# Patient Record
Sex: Female | Born: 1950 | Race: White | Hispanic: No | Smoking: Never smoker
Health system: Southern US, Community
[De-identification: ages and names within clinical notes are randomized; demographics above are authoritative.]

## PROBLEM LIST (undated history)

## (undated) DIAGNOSIS — I10 Essential (primary) hypertension: Secondary | ICD-10-CM

## (undated) DIAGNOSIS — Z803 Family history of malignant neoplasm of breast: Secondary | ICD-10-CM

## (undated) DIAGNOSIS — Z800 Family history of malignant neoplasm of digestive organs: Secondary | ICD-10-CM

## (undated) DIAGNOSIS — C50919 Malignant neoplasm of unspecified site of unspecified female breast: Secondary | ICD-10-CM

## (undated) DIAGNOSIS — E063 Autoimmune thyroiditis: Secondary | ICD-10-CM

## (undated) DIAGNOSIS — Z801 Family history of malignant neoplasm of trachea, bronchus and lung: Secondary | ICD-10-CM

## (undated) DIAGNOSIS — K9041 Non-celiac gluten sensitivity: Secondary | ICD-10-CM

## (undated) HISTORY — DX: Essential (primary) hypertension: I10

## (undated) HISTORY — DX: Family history of malignant neoplasm of digestive organs: Z80.0

## (undated) HISTORY — DX: Autoimmune thyroiditis: E06.3

## (undated) HISTORY — DX: Non-celiac gluten sensitivity: K90.41

## (undated) HISTORY — DX: Malignant neoplasm of unspecified site of unspecified female breast: C50.919

## (undated) HISTORY — DX: Family history of malignant neoplasm of breast: Z80.3

## (undated) HISTORY — DX: Family history of malignant neoplasm of trachea, bronchus and lung: Z80.1

---

## 1972-04-11 HISTORY — PX: LUMBAR DISC SURGERY: SHX700

## 1977-04-11 HISTORY — PX: APPENDECTOMY: SHX54

## 1977-04-11 HISTORY — PX: RIGHT OOPHORECTOMY: SHX2359

## 2015-04-12 HISTORY — PX: URETEROSCOPY: SHX842

## 2017-04-11 HISTORY — PX: MASTECTOMY W/ SENTINEL NODE BIOPSY: SHX2001

## 2017-07-22 DIAGNOSIS — F419 Anxiety disorder, unspecified: Secondary | ICD-10-CM | POA: Insufficient documentation

## 2017-07-22 DIAGNOSIS — Z8639 Personal history of other endocrine, nutritional and metabolic disease: Secondary | ICD-10-CM | POA: Insufficient documentation

## 2017-07-22 DIAGNOSIS — C50919 Malignant neoplasm of unspecified site of unspecified female breast: Secondary | ICD-10-CM | POA: Insufficient documentation

## 2017-10-02 LAB — IRON,TIBC AND FERRITIN PANEL
%SAT: 32
Ferritin: 313
TIBC: 265
UIBC: 179

## 2017-10-02 LAB — LIPID PANEL
Cholesterol: 135 (ref 0–200)
HDL: 49 (ref 35–70)
LDL Cholesterol: 75
Triglycerides: 57 (ref 40–160)

## 2017-10-02 LAB — CBC AND DIFFERENTIAL
HCT: 34 — AB (ref 36–46)
Hemoglobin: 11.3 — AB (ref 12.0–16.0)
Neutrophils Absolute: 2
Platelets: 155 (ref 150–399)
WBC: 4.3

## 2017-10-02 LAB — VITAMIN D 25 HYDROXY (VIT D DEFICIENCY, FRACTURES): VIT D 25 HYDROXY: 71.7

## 2017-10-02 LAB — BASIC METABOLIC PANEL
BUN: 20 (ref 4–21)
CREATININE: 0.8 (ref 0.5–1.1)
GLUCOSE: 84
POTASSIUM: 4.1 (ref 3.4–5.3)
Sodium: 142 (ref 137–147)

## 2017-10-02 LAB — HEPATIC FUNCTION PANEL
ALT: 12 (ref 7–35)
AST: 22 (ref 13–35)
Alkaline Phosphatase: 70 (ref 25–125)
Bilirubin, Total: 0.6

## 2017-10-02 LAB — TSH: TSH: 2.1 (ref 0.41–5.90)

## 2017-10-02 LAB — VITAMIN B12: VITAMIN B 12: 200

## 2017-10-02 LAB — HEMOGLOBIN A1C: HEMOGLOBIN A1C: 5

## 2017-10-27 DIAGNOSIS — C50812 Malignant neoplasm of overlapping sites of left female breast: Secondary | ICD-10-CM | POA: Insufficient documentation

## 2017-10-27 DIAGNOSIS — Z170 Estrogen receptor positive status [ER+]: Secondary | ICD-10-CM

## 2017-11-08 ENCOUNTER — Ambulatory Visit (INDEPENDENT_AMBULATORY_CARE_PROVIDER_SITE_OTHER): Payer: Medicare HMO | Admitting: Family Medicine

## 2017-11-08 ENCOUNTER — Encounter: Payer: Self-pay | Admitting: Family Medicine

## 2017-11-08 VITALS — BP 118/76 | HR 66 | Temp 97.5°F | Resp 16 | Ht 66.0 in | Wt 182.0 lb

## 2017-11-08 DIAGNOSIS — Z9012 Acquired absence of left breast and nipple: Secondary | ICD-10-CM | POA: Diagnosis not present

## 2017-11-08 DIAGNOSIS — C50912 Malignant neoplasm of unspecified site of left female breast: Secondary | ICD-10-CM

## 2017-11-08 DIAGNOSIS — I10 Essential (primary) hypertension: Secondary | ICD-10-CM

## 2017-11-08 DIAGNOSIS — Z170 Estrogen receptor positive status [ER+]: Secondary | ICD-10-CM | POA: Diagnosis not present

## 2017-11-08 DIAGNOSIS — C50919 Malignant neoplasm of unspecified site of unspecified female breast: Secondary | ICD-10-CM | POA: Insufficient documentation

## 2017-11-08 NOTE — Progress Notes (Signed)
Patient: Meredith Hamilton, Female    DOB: 1951/01/19, 67 y.o.   MRN: [id number] Visit Date: 11/09/2017  Today's Provider: Lavon Paganini, MD   I, Martha Clan, CMA, am acting as scribe for Lavon Paganini, MD.  Chief Complaint  Patient presents with  . New Patient (Initial Visit)   Subjective:    New Patient Meredith Hamilton is a 67 y.o. female who presents today as a new patient to establish care. She feels well. She reports exercising daily. She reports she is sleeping well.  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  After sentinel lymph node was positive, they converted to left axillary lymph node dissection. - Pathology from her mastectomy and lymph node dissection showed invasive lobular carcinoma, 2 foci, multicentric.  Metastatic carcinoma in 4 of 14 lymph nodes -She was staged as T2N2A -She then was seen by the [facility] in [place] that recommended chemotherapy with Taxotere, cyclophosphamide, Kytril - Patient decided not to undergo chemotherapy next line-she moved back to [place] from [place] as she felt that her care was disjointed and she wanted to be with her family -Since moving to [place] she has been followed by surgical oncology at [place] is seeing Dalene Carrow, NP -She has not undergone further surgeries or treatment, but she has had to have a seroma drained multiple times -She denies any lymphedema symptoms at this time and is doing her exercises regularly - Surgical oncology discussed with her seeing a medical oncologist and she has agreed to this-she is waiting on referral -Her son-in-law practices functional medicine, but I do not believe he is in [place].  He is checking  several labs on her every 45 days, including CMP, A1c, uric acid, B12, folate, selenium, iron panel, cholesterol, CRP, TSH, T4, T3 uptake, free thyroxine index, T3, free T3, reverse T3, free T4, TPO antibodies, thyroglobulin antibody, vitamin D, homocystine, fibrinogen activity, CBC, UA with micro, Ca1 25, CEA, CA 27.29 -Her son-in-law is also giving her many supplements to take-see medications - she is also doing a paleo diet, laser treatment to "clean out her blood," oxygenating her lymphatic system  HTN: - diagnosed about 4 years - Medications: amlodipine 34m daily - Compliance: good - Checking BP at home: no - Denies any SOB, CP, vision changes, LE edema, medication SEs, or symptoms of hypotension  Of note, patient believes that she had her last colonoscopy in 2016 and that it was normal.  She will get these records and bring them by. -----------------------------------------------------------------   Review of Systems  Constitutional: Negative.   HENT: Negative.   Eyes: Negative.   Respiratory: Negative.   Cardiovascular: Negative.   Gastrointestinal: Negative.   Endocrine: Negative.   Genitourinary: Negative.   Musculoskeletal: Negative.   Skin: Negative.   Allergic/Immunologic: Negative.   Neurological: Negative.   Hematological: Negative.   Psychiatric/Behavioral: Negative.     Social History      She  reports that she has never smoked. She has never used smokeless tobacco. She reports that she drank alcohol. She reports that she does not use drugs.       Social History   Socioeconomic History  . Marital status: Divorced    Spouse name: Not on  file  . Number of children: 2  . Years of education: 73  . Highest education level: High school graduate  Occupational History  . Occupation: retired Sales promotion account executive for [employer]  . Financial resource strain: Not on file  . Food insecurity:    Worry: Not on file    Inability: Not on file  .  Transportation needs:    Medical: Not on file    Non-medical: Not on file  Tobacco Use  . Smoking status: Never Smoker  . Smokeless tobacco: Never Used  Substance and Sexual Activity  . Alcohol use: Not Currently    Frequency: Never  . Drug use: Never  . Sexual activity: Not Currently  Lifestyle  . Physical activity:    Days per week: Not on file    Minutes per session: Not on file  . Stress: Not on file  Relationships  . Social connections:    Talks on phone: Not on file    Gets together: Not on file    Attends religious service: Not on file    Active member of club or organization: Not on file    Attends meetings of clubs or organizations: Not on file    Relationship status: Not on file  Other Topics Concern  . Not on file  Social History Narrative  . Not on file    Past Medical History:  Diagnosis Date  . Breast CA ([place])   . Hashimoto's disease   . Hypertension   . Non-celiac gluten sensitivity      Patient Active Problem List   Diagnosis Date Noted  . Breast cancer ([place]) 11/08/2017  . Status post mastectomy, left 11/08/2017  . Essential hypertension 11/08/2017    Past Surgical History:  Procedure Laterality Date  . APPENDECTOMY  1979  . [place]  . MASTECTOMY W/ SENTINEL NODE BIOPSY Left 2019  . RIGHT OOPHORECTOMY  1979   due to ovarian torsion  . URETEROSCOPY Right 2017    Family History        Family Status  Relation Name Status  . Mother  Deceased  . Father  Deceased  . Sister  (Not Specified)  . Brother  Deceased  . Brother  Deceased  . Neg Hx  (Not Specified)        Her family history includes Breast cancer (age of onset: 69) in her sister; Lung cancer in her brother, father, and mother; Throat cancer in her brother. There is no history of Colon cancer, Cervical cancer, or Ovarian cancer.      Allergies  Allergen Reactions  . Sulfa Antibiotics Swelling  . Tramadol Other (See Comments)    Other reaction(s):  Hallucination     Current Outpatient Medications:  .  amLODipine (NORVASC) 10 MG tablet, Take 10 mg by mouth daily., Disp: , Rfl:  .  Cholecalciferol (VITAMIN D3) LIQD, Take 6 drops by mouth daily., Disp: , Rfl:  .  Homeopathic Products (FRANKINCENSE UPLIFTING) OIL, Take 7 drops by mouth daily., Disp: , Rfl:  .  Misc Natural Products (SPLEEN) CAPS, Take 2 capsules by mouth daily., Disp: , Rfl:  .  OVER THE COUNTER MEDICATION, Take 4 Scoops by mouth daily. Pecta Sol-C (Mod. Citrus Pectin), Disp: , Rfl:  .  OVER THE COUNTER MEDICATION, Take 2 Scoops by mouth daily. Fermented Mushroom Blend, Disp: , Rfl:  .  OVER THE COUNTER MEDICATION, Take 6 capsules by mouth daily. Cruciferous Complete, Disp: , Rfl:  .  OVER THE COUNTER MEDICATION, Nitric Balance 4 tsp daily, Disp: , Rfl:  .  Specialty Vitamins Products (MAMMARY) TABS, Take 2 tablets by mouth daily., Disp: , Rfl:  .  TURMERIC PO, Take 7 drops by mouth daily., Disp: , Rfl:  .  UNABLE TO FIND, Take 6 tablets by mouth daily. Med Name: Vitamin B 17, Disp: , Rfl:  .  UNABLE TO FIND, Take 10 drops by mouth daily. Med Name: Liquid Vitamin A, Disp: , Rfl:  .  UNABLE TO FIND, Med Name: Hydrex 2 vials daily, Disp: , Rfl:  .  UNABLE TO FIND, Med Name: Proglyco-SP 3 every other day, Disp: , Rfl:  .  UNABLE TO FIND, Med Name: Hemp Oil Complete 2 daily, Disp: , Rfl:  .  UNABLE TO FIND, Med Name: Artemisinin Complex 2 daily, Disp: , Rfl:  .  UNABLE TO FIND, Med Name: Adrena Calm 1/4 pump daily, Disp: , Rfl:  .  UNABLE TO FIND, Med Name: Immuno Plus 3 drops daily, Disp: , Rfl:  .  UNABLE TO FIND, Med Name: Rockne Menghini Glutathione 5 tsp daily, Disp: , Rfl:  .  UNABLE TO FIND, Med Name: Vitamin C Drip 20,000 once weekly, Disp: , Rfl:  .  UNABLE TO FIND, Med Name: Myrrh 3 drops daily, Disp: , Rfl:  .  UNABLE TO FIND, Med Name: Welford Roche Vite 6 per day, Disp: , Rfl:  .  UNABLE TO FIND, Med Name: Pea Protein Isolate 1 scoop per day, Disp: , Rfl:  .  UNABLE TO FIND,  Med Name: Nourish Greens 1 scoop per day, Disp: , Rfl:    Patient Care Team: Virginia Crews, MD as PCP - General (Family Medicine)      Objective:   Vitals: BP 118/76 (BP Location: Left Arm, Patient Position: Sitting, Cuff Size: Large)   Pulse 66   Temp (!) 97.5 F (36.4 C) (Oral)   Resp 16   Ht _0  (1.676 m)   Wt 182 lb (82.6 kg)   SpO2 98%   BMI 29.38 kg/m    Vitals:   11/08/17 1320  BP: 118/76  Pulse: 66  Resp: 16  Temp: (!) 97.5 F (36.4 C)  TempSrc: Oral  SpO2: 98%  Weight: 182 lb (82.6 kg)  Height: _1  (1.676 m)     Physical Exam  Constitutional: She is oriented to person, place, and time. She appears well-developed and well-nourished. No distress.  HENT:  Head: Normocephalic and atraumatic.  Right Ear: External ear normal.  Left Ear: External ear normal.  Nose: Nose normal.  Mouth/Throat: Oropharynx is clear and moist.  Eyes: Pupils are equal, round, and reactive to light. Conjunctivae and EOM are normal. No scleral icterus.  Neck: Neck supple. No thyromegaly present.  Cardiovascular: Normal rate, regular rhythm, normal heart sounds and intact distal pulses.  No murmur heard. Pulmonary/Chest: Effort normal and breath sounds normal. No respiratory distress. She has no wheezes. She has no rales.  Abdominal: Soft. Bowel sounds are normal. She exhibits no distension. There is no tenderness. There is no rebound and no guarding.  Musculoskeletal: She exhibits no edema or deformity.  Lymphadenopathy:    She has no cervical adenopathy.  Neurological: She is alert and oriented to person, place, and time. No cranial nerve deficit or sensory deficit. She exhibits normal muscle tone.  Skin: Skin is warm and dry. Capillary refill takes less than 2 seconds. No rash noted.  Psychiatric: She has a normal mood and affect. Her  behavior is normal.  Vitals reviewed.    Depression Screen PHQ 2/9 Scores 11/09/2017  PHQ - 2 Score 0    Assessment & Plan:      Problem List Items Addressed This Visit      Cardiovascular and Mediastinum   Essential hypertension    Well-controlled Reviewed recent CMP Continue amlodipine 10 mg daily Follow-up in 3 to 6 months      Relevant Medications   amLODipine (NORVASC) 10 MG tablet     Other   Breast cancer ([place]) - Primary    Discussed with patient that breast cancer is likely a very treatable cancer She has agreed to see a medical oncologist to discuss options and make a more informed decision      Status post mastectomy, left    Followed by Surg Onc at [facility] for complications         Return in about 3 months (around 02/08/2018) for chronic disease f/u.   Addressed extensive list of chronic and acute medical problems today requiring extensive time in counseling and coordination of care.  Over half of this 45 minute visit were spent in counseling and coordinating care of multiple medical problems.    The entirety of the information documented in the History of Present Illness, Review of Systems and Physical Exam were personally obtained by me. Portions of this information were initially documented by Raquel Sarna Ratchford, CMA and reviewed by me for thoroughness and accuracy.    Virginia Crews, MD, MPH [facility] 11/09/2017 10:00 AM

## 2017-11-08 NOTE — Patient Instructions (Signed)
DASH Eating Plan DASH stands for "Dietary Approaches to Stop Hypertension." The DASH eating plan is a healthy eating plan that has been shown to reduce high blood pressure (hypertension). It may also reduce your risk for type 2 diabetes, heart disease, and stroke. The DASH eating plan may also help with weight loss. What are tips for following this plan? General guidelines  Avoid eating more than 2,300 mg (milligrams) of salt (sodium) a day. If you have hypertension, you may need to reduce your sodium intake to 1,500 mg a day.  Limit alcohol intake to no more than 1 drink a day for nonpregnant women and 2 drinks a day for men. One drink equals 12 oz of beer, 5 oz of wine, or 1 oz of hard liquor.  Work with your health care provider to maintain a healthy body weight or to lose weight. Ask what an ideal weight is for you.  Get at least 30 minutes of exercise that causes your heart to beat faster (aerobic exercise) most days of the week. Activities may include walking, swimming, or biking.  Work with your health care provider or diet and nutrition specialist (dietitian) to adjust your eating plan to your individual calorie needs. Reading food labels  Check food labels for the amount of sodium per serving. Choose foods with less than 5 percent of the Daily Value of sodium. Generally, foods with less than 300 mg of sodium per serving fit into this eating plan.  To find whole grains, look for the word "whole" as the first word in the ingredient list. Shopping  Buy products labeled as "low-sodium" or "no salt added."  Buy fresh foods. Avoid canned foods and premade or frozen meals. Cooking  Avoid adding salt when cooking. Use salt-free seasonings or herbs instead of table salt or sea salt. Check with your health care provider or pharmacist before using salt substitutes.  Do not fry foods. Cook foods using healthy methods such as baking, boiling, grilling, and broiling instead.  Cook with  heart-healthy oils, such as olive, canola, soybean, or sunflower oil. Meal planning   Eat a balanced diet that includes: ? 5 or more servings of fruits and vegetables each day. At each meal, try to fill half of your plate with fruits and vegetables. ? Up to 6-8 servings of whole grains each day. ? Less than 6 oz of lean meat, poultry, or fish each day. A 3-oz serving of meat is about the same size as a deck of cards. One egg equals 1 oz. ? 2 servings of low-fat dairy each day. ? A serving of nuts, seeds, or beans 5 times each week. ? Heart-healthy fats. Healthy fats called Omega-3 fatty acids are found in foods such as flaxseeds and coldwater fish, like sardines, salmon, and mackerel.  Limit how much you eat of the following: ? Canned or prepackaged foods. ? Food that is high in trans fat, such as fried foods. ? Food that is high in saturated fat, such as fatty meat. ? Sweets, desserts, sugary drinks, and other foods with added sugar. ? Full-fat dairy products.  Do not salt foods before eating.  Try to eat at least 2 vegetarian meals each week.  Eat more home-cooked food and less restaurant, buffet, and fast food.  When eating at a restaurant, ask that your food be prepared with less salt or no salt, if possible. What foods are recommended? The items listed may not be a complete list. Talk with your dietitian about what   dietary choices are best for you. Grains Whole-grain or whole-wheat bread. Whole-grain or whole-wheat pasta. Brown rice. Oatmeal. Quinoa. Bulgur. Whole-grain and low-sodium cereals. Pita bread. Low-fat, low-sodium crackers. Whole-wheat flour tortillas. Vegetables Fresh or frozen vegetables (raw, steamed, roasted, or grilled). Low-sodium or reduced-sodium tomato and vegetable juice. Low-sodium or reduced-sodium tomato sauce and tomato paste. Low-sodium or reduced-sodium canned vegetables. Fruits All fresh, dried, or frozen fruit. Canned fruit in natural juice (without  added sugar). Meat and other protein foods Skinless chicken or turkey. Ground chicken or turkey. Pork with fat trimmed off. Fish and seafood. Egg whites. Dried beans, peas, or lentils. Unsalted nuts, nut butters, and seeds. Unsalted canned beans. Lean cuts of beef with fat trimmed off. Low-sodium, lean deli meat. Dairy Low-fat (1%) or fat-free (skim) milk. Fat-free, low-fat, or reduced-fat cheeses. Nonfat, low-sodium ricotta or cottage cheese. Low-fat or nonfat yogurt. Low-fat, low-sodium cheese. Fats and oils Soft margarine without trans fats. Vegetable oil. Low-fat, reduced-fat, or light mayonnaise and salad dressings (reduced-sodium). Canola, safflower, olive, soybean, and sunflower oils. Avocado. Seasoning and other foods Herbs. Spices. Seasoning mixes without salt. Unsalted popcorn and pretzels. Fat-free sweets. What foods are not recommended? The items listed may not be a complete list. Talk with your dietitian about what dietary choices are best for you. Grains Baked goods made with fat, such as croissants, muffins, or some breads. Dry pasta or rice meal packs. Vegetables Creamed or fried vegetables. Vegetables in a cheese sauce. Regular canned vegetables (not low-sodium or reduced-sodium). Regular canned tomato sauce and paste (not low-sodium or reduced-sodium). Regular tomato and vegetable juice (not low-sodium or reduced-sodium). Pickles. Olives. Fruits Canned fruit in a light or heavy syrup. Fried fruit. Fruit in cream or butter sauce. Meat and other protein foods Fatty cuts of meat. Ribs. Fried meat. Bacon. Sausage. Bologna and other processed lunch meats. Salami. Fatback. Hotdogs. Bratwurst. Salted nuts and seeds. Canned beans with added salt. Canned or smoked fish. Whole eggs or egg yolks. Chicken or turkey with skin. Dairy Whole or 2% milk, cream, and half-and-half. Whole or full-fat cream cheese. Whole-fat or sweetened yogurt. Full-fat cheese. Nondairy creamers. Whipped toppings.  Processed cheese and cheese spreads. Fats and oils Butter. Stick margarine. Lard. Shortening. Ghee. Bacon fat. Tropical oils, such as coconut, palm kernel, or palm oil. Seasoning and other foods Salted popcorn and pretzels. Onion salt, garlic salt, seasoned salt, table salt, and sea salt. Worcestershire sauce. Tartar sauce. Barbecue sauce. Teriyaki sauce. Soy sauce, including reduced-sodium. Steak sauce. Canned and packaged gravies. Fish sauce. Oyster sauce. Cocktail sauce. Horseradish that you find on the shelf. Ketchup. Mustard. Meat flavorings and tenderizers. Bouillon cubes. Hot sauce and Tabasco sauce. Premade or packaged marinades. Premade or packaged taco seasonings. Relishes. Regular salad dressings. Where to find more information:  National Heart, Lung, and Blood Institute: www.nhlbi.nih.gov  American Heart Association: www.heart.org Summary  The DASH eating plan is a healthy eating plan that has been shown to reduce high blood pressure (hypertension). It may also reduce your risk for type 2 diabetes, heart disease, and stroke.  With the DASH eating plan, you should limit salt (sodium) intake to 2,300 mg a day. If you have hypertension, you may need to reduce your sodium intake to 1,500 mg a day.  When on the DASH eating plan, aim to eat more fresh fruits and vegetables, whole grains, lean proteins, low-fat dairy, and heart-healthy fats.  Work with your health care provider or diet and nutrition specialist (dietitian) to adjust your eating plan to your individual   calorie needs. This information is not intended to replace advice given to you by your health care provider. Make sure you discuss any questions you have with your health care provider. Document Released: 03/17/2011 Document Revised: 03/21/2016 Document Reviewed: 03/21/2016 Elsevier Interactive Patient Education  2018 Elsevier Inc.  

## 2017-11-09 ENCOUNTER — Encounter: Payer: Self-pay | Admitting: Family Medicine

## 2017-11-09 NOTE — Assessment & Plan Note (Signed)
Well-controlled Reviewed recent CMP Continue amlodipine 10 mg daily Follow-up in 3 to 6 months

## 2017-11-09 NOTE — Assessment & Plan Note (Signed)
Followed by Surg Onc at [facility] for complications

## 2017-11-09 NOTE — Assessment & Plan Note (Signed)
Discussed with patient that breast cancer is likely a very treatable cancer She has agreed to see a medical oncologist to discuss options and make a more informed decision

## 2017-12-27 ENCOUNTER — Telehealth: Payer: Self-pay | Admitting: Family Medicine

## 2017-12-27 NOTE — Telephone Encounter (Signed)
I left a message asking the pt to call me at [phone] to schedule AWV-I w/ [facility] McKenzie. VDM (DD) °

## 2018-01-16 LAB — LIPID PANEL
Cholesterol: 145 (ref 0–200)
HDL: 60 (ref 35–70)
LDL CALC: 76
Triglycerides: 45 (ref 40–160)

## 2018-01-16 LAB — CBC AND DIFFERENTIAL
HCT: 36 (ref 36–46)
Hemoglobin: 12.2 (ref 12.0–16.0)
Neutrophils Absolute: 2
PLATELETS: 156 (ref 150–399)
WBC: 4.5

## 2018-01-16 LAB — HEPATIC FUNCTION PANEL
ALK PHOS: 69 (ref 25–125)
ALT: 15 (ref 7–35)
AST: 20 (ref 13–35)
BILIRUBIN, TOTAL: 0.6

## 2018-01-16 LAB — IRON,TIBC AND FERRITIN PANEL
%SAT: 20
Ferritin: 232
Iron: 63
TIBC: 309
UIBC: 246

## 2018-01-16 LAB — BASIC METABOLIC PANEL
BUN: 17 (ref 4–21)
Creatinine: 0.7 (ref 0.5–1.1)
Glucose: 83
Potassium: 4.3 (ref 3.4–5.3)
Sodium: 144 (ref 137–147)

## 2018-01-16 LAB — TSH: TSH: 2.86 (ref 0.41–5.90)

## 2018-01-16 LAB — VITAMIN D 25 HYDROXY (VIT D DEFICIENCY, FRACTURES): Vit D, 25-Hydroxy: 55.7

## 2018-01-16 LAB — HEMOGLOBIN A1C: Hemoglobin A1C: 5

## 2018-01-16 NOTE — Telephone Encounter (Signed)
Scheduled for 01/25/18.

## 2018-01-25 ENCOUNTER — Ambulatory Visit (INDEPENDENT_AMBULATORY_CARE_PROVIDER_SITE_OTHER): Payer: Medicare HMO

## 2018-01-25 VITALS — BP 144/68 | HR 75 | Temp 98.0°F | Ht 66.0 in | Wt 175.4 lb

## 2018-01-25 DIAGNOSIS — Z13820 Encounter for screening for osteoporosis: Secondary | ICD-10-CM | POA: Diagnosis not present

## 2018-01-25 DIAGNOSIS — Z0000 Encounter for general adult medical examination without abnormal findings: Secondary | ICD-10-CM | POA: Diagnosis not present

## 2018-01-25 NOTE — Progress Notes (Signed)
Subjective:   Meredith Hamilton is a 67 y.o. female who presents for an Initial Medicare Annual Wellness Visit.  Review of Systems    N/A  Cardiac Risk Factors include: advanced age (>58men, >15 women);hypertension     Objective:    Today's Vitals   01/25/18 0954  BP: (!) 144/68  Pulse: 75  Temp: 98 F (36.7 C)  TempSrc: Oral  Weight: 175 lb 6.4 oz (79.6 kg)  Height: 5\' 6"  (1.676 m)  PainSc: 0-No pain   Body mass index is 28.31 kg/m.  Advanced Directives 01/25/2018  Does Patient Have a Medical Advance Directive? No  Would patient like information on creating a medical advance directive? No - Patient declined    Current Medications (verified) Outpatient Encounter Medications as of 01/25/2018  Medication Sig  . amLODipine (NORVASC) 10 MG tablet Take 10 mg by mouth daily.  . Cholecalciferol (VITAMIN D3) LIQD Take 6 drops by mouth daily.  . Homeopathic Products (FRANKINCENSE UPLIFTING) OIL Take 7 drops by mouth daily.  . Misc Natural Products (SPLEEN) CAPS Take 2 capsules by mouth daily.  Marland Kitchen OVER THE COUNTER MEDICATION Take 4 Scoops by mouth daily. Pecta Sol-C (Mod. Citrus Pectin)  . OVER THE COUNTER MEDICATION Take 2 Scoops by mouth daily. Fermented Mushroom Blend  . OVER THE COUNTER MEDICATION Take 6 capsules by mouth daily. Cruciferous Complete  . OVER THE COUNTER MEDICATION Nitric Balance 4 tsp daily  . Specialty Vitamins Products (MAMMARY) TABS Take 2 tablets by mouth daily.  . TURMERIC PO Take 7 drops by mouth daily.  Marland Kitchen UNABLE TO FIND Take 6 tablets by mouth daily. Med Name: Vitamin B 17  . UNABLE TO FIND Take 10 drops by mouth daily. Med Name: Liquid Vitamin A  . UNABLE TO FIND Med Name: Hydrex 2 vials daily  . UNABLE TO FIND Med Name: [id number]-VO 3 every other day  . UNABLE TO FIND Med Name: Hemp Oil Complete 2 daily  . UNABLE TO FIND Med Name: Artemisinin Complex 2 daily  . UNABLE TO FIND Med Name: Adrena Calm 1/4 pump daily  . UNABLE TO FIND Med Name: Immuno Plus 3  drops daily  . UNABLE TO FIND Med Name: Rockne Menghini Glutathione 5 tsp daily  . UNABLE TO FIND Med Name: Vitamin C Drip 20,000 once weekly  . UNABLE TO FIND Med Name: Myrrh 3 drops daily  . UNABLE TO FIND Med Name: Welford Roche Vite 6 per day  . UNABLE TO FIND Med Name: Pea Protein Isolate 1 scoop per day  . UNABLE TO FIND Med Name: Alen Blew 1 scoop per day   No facility-administered encounter medications on file as of 01/25/2018.     Allergies (verified) Sulfa antibiotics and Tramadol   History: Past Medical History:  Diagnosis Date  . Breast CA ([place])   . Hashimoto's disease   . Hypertension   . Non-celiac gluten sensitivity    Past Surgical History:  Procedure Laterality Date  . APPENDECTOMY  1979  . [place]  . MASTECTOMY W/ SENTINEL NODE BIOPSY Left 2019  . RIGHT OOPHORECTOMY  1979   due to ovarian torsion  . URETEROSCOPY Right 2017   Family History  Problem Relation Age of Onset  . Lung cancer Mother        smoker  . Lung cancer Father        smoker  . Breast cancer Sister 70       in remission, now in mid 67s  .  Lung cancer Brother        smoker  . Throat cancer Brother        alcohol  . Colon cancer Neg Hx   . Cervical cancer Neg Hx   . Ovarian cancer Neg Hx    Social History   Socioeconomic History  . Marital status: Divorced    Spouse name: Not on file  . Number of children: 2  . Years of education: 40  . Highest education level: High school graduate  Occupational History  . Occupation: retired Sales promotion account executive for [employer]  . Financial resource strain: Not hard at all  . Food insecurity:    Worry: Never true    Inability: Never true  . Transportation needs:    Medical: No    Non-medical: No  Tobacco Use  . Smoking status: Never Smoker  . Smokeless tobacco: Never Used  Substance and Sexual Activity  . Alcohol use: Not Currently    Frequency: Never  . Drug use: Never  . Sexual activity: Not Currently    Lifestyle  . Physical activity:    Days per week: 5 days    Minutes per session: 60 min  . Stress: Only a little  Relationships  . Social connections:    Talks on phone: Patient refused    Gets together: Patient refused    Attends religious service: Patient refused    Active member of club or organization: Patient refused    Attends meetings of clubs or organizations: Patient refused    Relationship status: Patient refused  Other Topics Concern  . Not on file  Social History Narrative  . Not on file    Tobacco Counseling Counseling given: Not Answered   Clinical Intake:  Pre-visit preparation completed: Yes  Pain : No/denies pain Pain Score: 0-No pain     Nutritional Status: BMI 25 -29 Overweight Nutritional Risks: None Diabetes: No  How often do you need to have someone help you when you read instructions, pamphlets, or other written materials from your doctor or pharmacy?: 1 - Never  Interpreter Needed?: No  Information entered by :: [facility], LPN   Activities of Daily Living In your present state of health, do you have any difficulty performing the following activities: 01/25/2018 11/09/2017  Hearing? N N  Vision? N N  Difficulty concentrating or making decisions? N N  Walking or climbing stairs? N N  Dressing or bathing? N N  Doing errands, shopping? N N  Preparing Food and eating ? N -  Using the Toilet? N -  In the past six months, have you accidently leaked urine? N -  Do you have problems with loss of bowel control? N -  Managing your Medications? N -  Managing your Finances? N -  Housekeeping or managing your Housekeeping? N -     Immunizations and Health Maintenance  There is no immunization history on file for this patient. Health Maintenance Due  Topic Date Due  . Hepatitis C Screening  1951-01-02  . TETANUS/TDAP  06/01/1967  . MAMMOGRAM  05/31/1998  . COLONOSCOPY  05/31/1998  . DEXA SCAN  05/31/2013  . PNA vac Low Risk Adult (1 of 2  - PCV13) 05/31/2013    Patient Care Team: Virginia Crews, MD as PCP - General (Family Medicine) Johny Blamer, Katherine (Chiropractic Medicine) Debbora Dus, NP (Nurse Practitioner)  Indicate any recent Medical Services you may have received from other than [facility] providers in the past year (date  may be approximate).     Assessment:   This is a routine wellness examination for [place].  Hearing/Vision screen No exam data present  Dietary issues and exercise activities discussed: Current Exercise Habits: Structured exercise class, Type of exercise: stretching;exercise ball;Other - see comments;walking;strength training/weights(vibrating plate), Time (Minutes): 60, Frequency (Times/Week): 5, Weekly Exercise (Minutes/Week): 300, Intensity: Mild, Exercise limited by: None identified  Goals    . DIET - REDUCE SUGAR INTAKE     Continue current diet plan of cutting out sugar in daily diet to help aid in weight loss.       Depression Screen PHQ 2/9 Scores 01/25/2018 11/09/2017  PHQ - 2 Score 0 0    Fall Risk Fall Risk  01/25/2018 11/09/2017  Falls in the past year? No No    FALL RISK PREVENTION PERTAINING TO THE HOME:  Any stairs in or around the home WITH handrails? No  Home free of loose throw rugs in walkways, pet beds, electrical cords, etc? Yes  Adequate lighting in your home to reduce risk of falls? Yes   ASSISTIVE DEVICES UTILIZED TO PREVENT FALLS:  Life alert? No  Use of a cane, walker or w/c? No  Grab bars in the bathroom? No  Shower chair or bench in shower? No  Elevated toilet seat or a handicapped toilet? No   DME ORDERS:  DME order needed?  No   TIMED UP AND GO:  Was the test performed? No .      6CIT Screen 01/25/2018  What Year? 0 points  What month? 0 points  What time? 0 points  Count back from 20 0 points  Months in reverse 0 points  Repeat phrase 0 points  Total Score 0    Screening Tests Health Maintenance  Topic Date Due  . Hepatitis C  Screening  08-03-1950  . TETANUS/TDAP  06/01/1967  . MAMMOGRAM  05/31/1998  . COLONOSCOPY  05/31/1998  . DEXA SCAN  05/31/2013  . PNA vac Low Risk Adult (1 of 2 - PCV13) 05/31/2013  . INFLUENZA VACCINE  07/10/2018 (Originally 11/09/2017)    Qualifies for Shingles Vaccine? Yes . Due for Shingrix. Education has been provided regarding the importance of this vaccine. Pt has been advised to call insurance company to determine out of pocket expense. Advised may also receive vaccine at local pharmacy or Health Dept. Verbalized acceptance and understanding.  Tdap: Although this vaccine is not a covered service during a Wellness Exam, does the patient still wish to receive this vaccine today?  No .  Education has been provided regarding the importance of this vaccine. Advised may receive this vaccine at local pharmacy or Health Dept. Aware to provide a copy of the vaccination record if obtained from local pharmacy or Health Dept. Verbalized acceptance and understanding.  Flu Vaccine: Due for Flu vaccine. Does the patient want to receive this vaccine today?  No . Education has been provided regarding the importance of this vaccine but still declined. Advised may receive this vaccine at local pharmacy or Health Dept. Aware to provide a copy of the vaccination record if obtained from local pharmacy or Health Dept. Verbalized acceptance and understanding.  Pneumococcal Vaccine: Due for Pneumococcal vaccine. Does the patient want to receive this vaccine today?  No . Education has been provided regarding the importance of this vaccine but still declined. Advised may receive this vaccine at local pharmacy or Health Dept. Aware to provide a copy of the vaccination record if obtained from local pharmacy or  Health Dept. Verbalized acceptance and understanding.  Cancer Screenings:  Colorectal Screening: Awaiting previous records to up date chart.   Mammogram: Awaiting previous records to up date chart.   Bone  Density: Ordered today.  Lung Cancer Screening: (Low Dose CT Chest recommended if Age 34-80 years, 30 pack-year currently smoking OR have quit w/in 15years.) does not qualify.    Additional Screening:  Hepatitis C Screening: does qualify however declines order  Vision Screening: Recommended annual ophthalmology exams for early detection of glaucoma and other disorders of the eye.  Dental Screening: Recommended annual dental exams for proper oral hygiene  Community Resource Referral:  CRR required this visit?  No      Plan:  I have personally reviewed and addressed the Medicare Annual Wellness questionnaire and have noted the following in the patient's chart:  A. Medical and social history B. Use of alcohol, tobacco or illicit drugs  C. Current medications and supplements D. Functional ability and status E.  Nutritional status F.  Physical activity G. Advance directives H. List of other physicians I.  Hospitalizations, surgeries, and ER visits in previous 12 months J.  [place] such as hearing and vision if needed, cognitive and depression L. Referrals and appointments - none  In addition, I have reviewed and discussed with patient certain preventive protocols, quality metrics, and best practice recommendations. A written personalized care plan for preventive services as well as general preventive health recommendations were provided to patient.  See attached scanned questionnaire for additional information.   Signed,  Fabio Neighbors, LPN Nurse Health Advisor   Nurse Recommendations: Ordered DEXA scan today. Pt declined the Hep C lab, tetanus, pneumonia and influenza vaccines today. Awaiting records to up date mammogram and colonoscopy.

## 2018-01-25 NOTE — Patient Instructions (Addendum)
Meredith Hamilton , Thank you for taking time to come for your Medicare Wellness Visit. I appreciate your ongoing commitment to your health goals. Please review the following plan we discussed and let me know if I can assist you in the future.   Screening recommendations/referrals: Colonoscopy: Awaiting previous records to up date chart.  Mammogram: Awaiting previous records to up date chart.  Bone Density: Ordered today.  Recommended yearly ophthalmology/optometry visit for glaucoma screening and checkup Recommended yearly dental visit for hygiene and checkup  Vaccinations: Influenza vaccine: Pt declines today.  Pneumococcal vaccine: Pt declines today.  Tdap vaccine: Pt declines today.  Shingles vaccine: Pt declines today.     Advanced directives: Advance directive discussed with you today. Even though you declined this today please call our office should you change your mind and we can give you the proper paperwork for you to fill out.  Conditions/risks identified: Continue current diet plan of cutting out sugar in daily diet to help aid in weight loss.   Next appointment: 02/07/18 with Dr Brita Romp.    Preventive Care 69 Years and Older, Female Preventive care refers to lifestyle choices and visits with your health care provider that can promote health and wellness. What does preventive care include?  A yearly physical exam. This is also called an annual well check.  Dental exams once or twice a year.  Routine eye exams. Ask your health care provider how often you should have your eyes checked.  Personal lifestyle choices, including:  Daily care of your teeth and gums.  Regular physical activity.  Eating a healthy diet.  Avoiding tobacco and drug use.  Limiting alcohol use.  Practicing safe sex.  Taking low-dose aspirin every day.  Taking vitamin and mineral supplements as recommended by your health care provider. What happens during an annual well check? The services  and screenings done by your health care provider during your annual well check will depend on your age, overall health, lifestyle risk factors, and family history of disease. Counseling  Your health care provider may ask you questions about your:  Alcohol use.  Tobacco use.  Drug use.  Emotional well-being.  Home and relationship well-being.  Sexual activity.  Eating habits.  History of falls.  Memory and ability to understand (cognition).  Work and work Statistician.  Reproductive health. Screening  You may have the following tests or measurements:  Height, weight, and BMI.  Blood pressure.  Lipid and cholesterol levels. These may be checked every 5 years, or more frequently if you are over 58 years old.  Skin check.  Lung cancer screening. You may have this screening every year starting at age 53 if you have a 30-pack-year history of smoking and currently smoke or have quit within the past 15 years.  Fecal occult blood test (FOBT) of the stool. You may have this test every year starting at age 69.  Flexible sigmoidoscopy or colonoscopy. You may have a sigmoidoscopy every 5 years or a colonoscopy every 10 years starting at age 69.  Hepatitis C blood test.  Hepatitis B blood test.  Sexually transmitted disease (STD) testing.  Diabetes screening. This is done by checking your blood sugar (glucose) after you have not eaten for a while (fasting). You may have this done every 1-3 years.  Bone density scan. This is done to screen for osteoporosis. You may have this done starting at age 69.  Mammogram. This may be done every 1-2 years. Talk to your health care provider about  how often you should have regular mammograms. Talk with your health care provider about your test results, treatment options, and if necessary, the need for more tests. Vaccines  Your health care provider may recommend certain vaccines, such as:  Influenza vaccine. This is recommended every  year.  Tetanus, diphtheria, and acellular pertussis (Tdap, Td) vaccine. You may need a Td booster every 10 years.  Zoster vaccine. You may need this after age 34.  Pneumococcal 13-valent conjugate (PCV13) vaccine. One dose is recommended after age 106.  Pneumococcal polysaccharide (PPSV23) vaccine. One dose is recommended after age 33. Talk to your health care provider about which screenings and vaccines you need and how often you need them. This information is not intended to replace advice given to you by your health care provider. Make sure you discuss any questions you have with your health care provider. Document Released: 04/24/2015 Document Revised: 12/16/2015 Document Reviewed: 01/27/2015 Elsevier Interactive Patient Education  2017 [place] Prevention in the Home Falls can cause injuries. They can happen to people of all ages. There are many things you can do to make your home safe and to help prevent falls. What can I do on the outside of my home?  Regularly fix the edges of walkways and driveways and fix any cracks.  Remove anything that might make you trip as you walk through a door, such as a raised step or threshold.  Trim any bushes or trees on the path to your home.  Use bright outdoor lighting.  Clear any walking paths of anything that might make someone trip, such as rocks or tools.  Regularly check to see if handrails are loose or broken. Make sure that both sides of any steps have handrails.  Any raised decks and porches should have guardrails on the edges.  Have any leaves, snow, or ice cleared regularly.  Use sand or salt on walking paths during winter.  Clean up any spills in your garage right away. This includes oil or grease spills. What can I do in the bathroom?  Use night lights.  Install grab bars by the toilet and in the tub and shower. Do not use towel bars as grab bars.  Use non-skid mats or decals in the tub or shower.  If you  need to sit down in the shower, use a plastic, non-slip stool.  Keep the floor dry. Clean up any water that spills on the floor as soon as it happens.  Remove soap buildup in the tub or shower regularly.  Attach bath mats securely with double-sided non-slip rug tape.  Do not have throw rugs and other things on the floor that can make you trip. What can I do in the bedroom?  Use night lights.  Make sure that you have a light by your bed that is easy to reach.  Do not use any sheets or blankets that are too big for your bed. They should not hang down onto the floor.  Have a firm chair that has side arms. You can use this for support while you get dressed.  Do not have throw rugs and other things on the floor that can make you trip. What can I do in the kitchen?  Clean up any spills right away.  Avoid walking on wet floors.  Keep items that you use a lot in easy-to-reach places.  If you need to reach something above you, use a strong step stool that has a grab bar.  Keep  electrical cords out of the way.  Do not use floor polish or wax that makes floors slippery. If you must use wax, use non-skid floor wax.  Do not have throw rugs and other things on the floor that can make you trip. What can I do with my stairs?  Do not leave any items on the stairs.  Make sure that there are handrails on both sides of the stairs and use them. Fix handrails that are broken or loose. Make sure that handrails are as long as the stairways.  Check any carpeting to make sure that it is firmly attached to the stairs. Fix any carpet that is loose or worn.  Avoid having throw rugs at the top or bottom of the stairs. If you do have throw rugs, attach them to the floor with carpet tape.  Make sure that you have a light switch at the top of the stairs and the bottom of the stairs. If you do not have them, ask someone to add them for you. What else can I do to help prevent falls?  Wear shoes  that:  Do not have high heels.  Have rubber bottoms.  Are comfortable and fit you well.  Are closed at the toe. Do not wear sandals.  If you use a stepladder:  Make sure that it is fully opened. Do not climb a closed stepladder.  Make sure that both sides of the stepladder are locked into place.  Ask someone to hold it for you, if possible.  Clearly mark and make sure that you can see:  Any grab bars or handrails.  First and last steps.  Where the edge of each step is.  Use tools that help you move around (mobility aids) if they are needed. These include:  Canes.  Walkers.  Scooters.  Crutches.  Turn on the lights when you go into a dark area. Replace any light bulbs as soon as they burn out.  Set up your furniture so you have a clear path. Avoid moving your furniture around.  If any of your floors are uneven, fix them.  If there are any pets around you, be aware of where they are.  Review your medicines with your doctor. Some medicines can make you feel dizzy. This can increase your chance of falling. Ask your doctor what other things that you can do to help prevent falls. This information is not intended to replace advice given to you by your health care provider. Make sure you discuss any questions you have with your health care provider. Document Released: 01/22/2009 Document Revised: 09/03/2015 Document Reviewed: 05/02/2014 Elsevier Interactive Patient Education  2017 [employer].

## 2018-01-29 LAB — URIC ACID
Albumin: 4.5
CO2: 20
CRP MG/L: 1.24
Calcium: 9.8
Chloride: 106
Free Thyroxine Index: 2
GGT: 16
GLOBULIN, TOTAL: 2
LDH: 186
MCH: 31.9
MCHC: 33.9
MCV: 94 (ref 76–111)
Magnesium: 2.1
Phosphorus: 3.9
RBC: 3.83 — AB (ref 3.87–5.11)
RDW: 12.6
Reverse T3, Serum: 12.6
T3 FREE: 2.7
T3 TOTAL: 104
T3 UPTAKE: 32
T4,FREE (DIRECT): 6.2
T4,Free (Direct): 1.12
THYROID PEROXIDASE ANTIBODY: 13
TOTAL PROTEIN: 6.5 g/dL
Thyroglobulin Antibody: 1146.1
Uric Acid: 4.3

## 2018-02-07 ENCOUNTER — Ambulatory Visit: Payer: Self-pay | Admitting: Family Medicine

## 2018-02-09 ENCOUNTER — Encounter: Payer: Self-pay | Admitting: Family Medicine

## 2018-02-09 ENCOUNTER — Ambulatory Visit (INDEPENDENT_AMBULATORY_CARE_PROVIDER_SITE_OTHER): Payer: Medicare HMO | Admitting: Family Medicine

## 2018-02-09 VITALS — BP 156/77 | HR 54 | Temp 97.9°F | Ht 66.0 in | Wt 178.6 lb

## 2018-02-09 DIAGNOSIS — Z0000 Encounter for general adult medical examination without abnormal findings: Secondary | ICD-10-CM

## 2018-02-09 DIAGNOSIS — Z170 Estrogen receptor positive status [ER+]: Secondary | ICD-10-CM

## 2018-02-09 DIAGNOSIS — I10 Essential (primary) hypertension: Secondary | ICD-10-CM

## 2018-02-09 DIAGNOSIS — E663 Overweight: Secondary | ICD-10-CM

## 2018-02-09 DIAGNOSIS — Z1159 Encounter for screening for other viral diseases: Secondary | ICD-10-CM

## 2018-02-09 DIAGNOSIS — Z8639 Personal history of other endocrine, nutritional and metabolic disease: Secondary | ICD-10-CM

## 2018-02-09 DIAGNOSIS — Z9012 Acquired absence of left breast and nipple: Secondary | ICD-10-CM | POA: Diagnosis not present

## 2018-02-09 DIAGNOSIS — C50912 Malignant neoplasm of unspecified site of left female breast: Secondary | ICD-10-CM

## 2018-02-09 MED ORDER — AMLODIPINE BESYLATE 10 MG PO TABS: 10.0000 mg | ORAL_TABLET | Freq: Every day | ORAL | 3 refills | Status: DC

## 2018-02-09 NOTE — Assessment & Plan Note (Signed)
As above, will refer to oncology

## 2018-02-09 NOTE — Patient Instructions (Signed)
Preventive Care 69 Years and Older, Female Preventive care refers to lifestyle choices and visits with your health care provider that can promote health and wellness. What does preventive care include?  A yearly physical exam. This is also called an annual well check.  Dental exams once or twice a year.  Routine eye exams. Ask your health care provider how often you should have your eyes checked.  Personal lifestyle choices, including: ? Daily care of your teeth and gums. ? Regular physical activity. ? Eating a healthy diet. ? Avoiding tobacco and drug use. ? Limiting alcohol use. ? Practicing safe sex. ? Taking low-dose aspirin every day. ? Taking vitamin and mineral supplements as recommended by your health care provider. What happens during an annual well check? The services and screenings done by your health care provider during your annual well check will depend on your age, overall health, lifestyle risk factors, and family history of disease. Counseling Your health care provider may ask you questions about your:  Alcohol use.  Tobacco use.  Drug use.  Emotional well-being.  Home and relationship well-being.  Sexual activity.  Eating habits.  History of falls.  Memory and ability to understand (cognition).  Work and work environment.  Reproductive health.  Screening You may have the following tests or measurements:  Height, weight, and BMI.  Blood pressure.  Lipid and cholesterol levels. These may be checked every 5 years, or more frequently if you are over 50 years old.  Skin check.  Lung cancer screening. You may have this screening every year starting at age 69 if you have a 30-pack-year history of smoking and currently smoke or have quit within the past 15 years.  Fecal occult blood test (FOBT) of the stool. You may have this test every year starting at age 69.  Flexible sigmoidoscopy or colonoscopy. You may have a sigmoidoscopy every 5 years or  a colonoscopy every 10 years starting at age 69.  Hepatitis C blood test.  Hepatitis B blood test.  Sexually transmitted disease (STD) testing.  Diabetes screening. This is done by checking your blood sugar (glucose) after you have not eaten for a while (fasting). You may have this done every 1-3 years.  Bone density scan. This is done to screen for osteoporosis. You may have this done starting at age 69.  Mammogram. This may be done every 1-2 years. Talk to your health care provider about how often you should have regular mammograms.  Talk with your health care provider about your test results, treatment options, and if necessary, the need for more tests. Vaccines Your health care provider may recommend certain vaccines, such as:  Influenza vaccine. This is recommended every year.  Tetanus, diphtheria, and acellular pertussis (Tdap, Td) vaccine. You may need a Td booster every 10 years.  Varicella vaccine. You may need this if you have not been vaccinated.  Zoster vaccine. You may need this after age 60.  Measles, mumps, and rubella (MMR) vaccine. You may need at least one dose of MMR if you were born in 1957 or later. You may also need a second dose.  Pneumococcal 13-valent conjugate (PCV13) vaccine. One dose is recommended after age 69.  Pneumococcal polysaccharide (PPSV23) vaccine. One dose is recommended after age 69.  Meningococcal vaccine. You may need this if you have certain conditions.  Hepatitis A vaccine. You may need this if you have certain conditions or if you travel or work in places where you may be exposed to hepatitis   A.  Hepatitis B vaccine. You may need this if you have certain conditions or if you travel or work in places where you may be exposed to hepatitis B.  Haemophilus influenzae type b (Hib) vaccine. You may need this if you have certain conditions.  Talk to your health care provider about which screenings and vaccines you need and how often you  need them. This information is not intended to replace advice given to you by your health care provider. Make sure you discuss any questions you have with your health care provider. Document Released: 04/24/2015 Document Revised: 12/16/2015 Document Reviewed: 01/27/2015 Elsevier Interactive Patient Education  2018 Elsevier Inc.  

## 2018-02-09 NOTE — Assessment & Plan Note (Signed)
Reviewed recent thyroid function - normal

## 2018-02-09 NOTE — Assessment & Plan Note (Signed)
Discussed diet and exercise 

## 2018-02-09 NOTE — Assessment & Plan Note (Signed)
Followed by [facility] for seroma and other complications

## 2018-02-09 NOTE — Assessment & Plan Note (Signed)
Uncontrolled Has been off of her medicine for ~2 months Will resume amlodipine 10mg  daily Reviewed recent labs F/u in 3 months

## 2018-02-09 NOTE — Assessment & Plan Note (Signed)
We have discussed it previously She agrees to see medical oncologist to discuss options and make a more informed decision

## 2018-02-09 NOTE — Progress Notes (Signed)
Patient: Meredith Hamilton, Female    DOB: February 22, 1951, 67 y.o.   MRN: [id number] Visit Date: 02/09/2018  Today's Provider: Lavon Paganini, MD   Chief Complaint  Patient presents with  . Annual Exam   Subjective:  I, Tiburcio Pea, CMA, am acting as a scribe for Lavon Paganini, MD.   Patient had a AWE with [facility] on 01/25/2018   Complete Physical Meredith Hamilton is a 67 y.o. female. She feels well. She reports exercising walking 10-15 minutes per day and using weights. She reports she is sleeping well.  States that she has had a colonoscopy within the last 3 years.  She will bring by documentation. DEXA ordered at [place] mammogram 04/2017.  Being treated for breast cancer She declines any vaccines  She was previously diagnosed with Breast cancer and is s/p mastectomy.  She did not seek further care, but is willing to discuss with an oncologist about options.  She follows with [facility] surgical oncology department for complications of mastectomy.  She also seeks homeopathic care.  HTN: - Medications: amlodipine 10mg  daily - Compliance: good, but has been out for ~2 months - Checking BP at home: yes, systolics in 193X - Denies any SOB, CP, vision changes, LE edema, medication SEs, or symptoms of hypotension  -----------------------------------------------------------   Review of Systems  Constitutional: Negative.   HENT: Negative.   Eyes: Negative.   Respiratory: Negative.   Cardiovascular: Negative.   Gastrointestinal: Negative.   Endocrine: Negative.   Genitourinary: Negative.   Musculoskeletal: Negative.   Skin: Negative.   Allergic/Immunologic: Negative.   Neurological: Negative.   Hematological: Negative.   Psychiatric/Behavioral: Negative.     Social History   Socioeconomic History  . Marital status: Divorced    Spouse name: Not on file  . Number of children: 2  . Years of education: 73  . Highest education level: High school graduate  Occupational  History  . Occupation: retired Sales promotion account executive for [employer]  . Financial resource strain: Not hard at all  . Food insecurity:    Worry: Never true    Inability: Never true  . Transportation needs:    Medical: No    Non-medical: No  Tobacco Use  . Smoking status: Never Smoker  . Smokeless tobacco: Never Used  Substance and Sexual Activity  . Alcohol use: Not Currently    Frequency: Never  . Drug use: Never  . Sexual activity: Not Currently  Lifestyle  . Physical activity:    Days per week: 5 days    Minutes per session: 60 min  . Stress: Only a little  Relationships  . Social connections:    Talks on phone: Patient refused    Gets together: Patient refused    Attends religious service: Patient refused    Active member of club or organization: Patient refused    Attends meetings of clubs or organizations: Patient refused    Relationship status: Patient refused  . Intimate partner violence:    Fear of current or ex partner: Patient refused    Emotionally abused: Patient refused    Physically abused: Patient refused    Forced sexual activity: Patient refused  Other Topics Concern  . Not on file  Social History Narrative  . Not on file    Past Medical History:  Diagnosis Date  . Breast CA ([place])   . Hashimoto's disease   . Hypertension   . Non-celiac gluten sensitivity      Patient  Active Problem List   Diagnosis Date Noted  . Breast cancer ([place]) 11/08/2017  . Status post mastectomy, left 11/08/2017  . Malignant neoplasm of overlapping sites of left breast in female, estrogen receptor positive ([place]) 10/27/2017  . Anxiety 07/22/2017  . H/O Hashimoto thyroiditis 07/22/2017  . 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 Essential (primary) hypertension 11/08/2004  . Obesity, unspecified 11/08/2004  . Rheumatoid arthritis ([place]) 06/07/2001    Past Surgical History:  Procedure Laterality Date  . APPENDECTOMY  1979  . [place]  . MASTECTOMY W/ SENTINEL NODE BIOPSY Left 2019  . RIGHT OOPHORECTOMY  1979   due to ovarian torsion  . URETEROSCOPY Right 2017    Her family history includes Breast cancer (age of onset: 52) in her sister; Lung cancer in her brother, father, and mother; Throat cancer in her brother. There is no history of Colon cancer, Cervical cancer, or Ovarian cancer.      Current Outpatient Medications:  .  amLODipine (NORVASC) 10 MG tablet, Take 10 mg by mouth daily., Disp: , Rfl:  .  Cholecalciferol (VITAMIN D3) LIQD, Take 6 drops by mouth daily., Disp: , Rfl:  .  Homeopathic Products (FRANKINCENSE UPLIFTING) OIL, Take 7 drops by mouth daily., Disp: , Rfl:  .  Misc Natural Products (SPLEEN) CAPS, Take 2 capsules by mouth daily., Disp: , Rfl:  .  OVER THE COUNTER MEDICATION, Take 4 Scoops by mouth daily. Pecta Sol-C (Mod. Citrus Pectin), Disp: , Rfl:  .  OVER THE COUNTER MEDICATION, Take 2 Scoops by mouth daily. Fermented Mushroom Blend, Disp: , Rfl:  .  OVER THE COUNTER MEDICATION, Take 6 capsules by mouth daily. Cruciferous Complete, Disp: , Rfl:  .  OVER THE COUNTER MEDICATION, Nitric Balance 4 tsp daily, Disp: , Rfl:  .  Specialty Vitamins Products (MAMMARY) TABS, Take 2 tablets by mouth daily., Disp: , Rfl:  .  TURMERIC PO, Take 7 drops by mouth daily., Disp: , Rfl:  .  UNABLE TO FIND, Take 6 tablets by mouth daily. Med Name: Vitamin B 17, Disp: , Rfl:  .  UNABLE TO FIND, Take 10 drops by mouth daily. Med Name: Liquid Vitamin A, Disp: , Rfl:  .  UNABLE TO FIND, Med Name: Hydrex 2 vials daily, Disp: , Rfl:  .  UNABLE TO FIND, Med Name: Proglyco-SP 3 every other day, Disp: , Rfl:  .  UNABLE TO FIND, Med Name: Hemp Oil Complete 2 daily, Disp: , Rfl:  .  UNABLE TO FIND, Med Name: Artemisinin Complex 2 daily, Disp: , Rfl:  .  UNABLE TO FIND, Med Name: Adrena Calm 1/4 pump daily, Disp: , Rfl:  .  UNABLE TO FIND, Med Name: Immuno Plus 3 drops daily, Disp: , Rfl:  .  UNABLE TO FIND, Med  Name: Rockne Menghini Glutathione 5 tsp daily, Disp: , Rfl:  .  UNABLE TO FIND, Med Name: Vitamin C Drip 20,000 once weekly, Disp: , Rfl:  .  UNABLE TO FIND, Med Name: Myrrh 3 drops daily, Disp: , Rfl:  .  UNABLE TO FIND, Med Name: Welford Roche Vite 6 per day, Disp: , Rfl:  .  UNABLE TO FIND, Med Name: Pea Protein Isolate 1 scoop per day, Disp: , Rfl:  .  UNABLE TO FIND, Med Name: Alen Blew 1 scoop per day, Disp: , Rfl:   Patient Care Team: Virginia Crews, MD as PCP - General (Family Medicine) Johny Blamer, [place] (Chiropractic Medicine) Debbora Dus,  NP (Nurse Practitioner)     Objective:   Vitals: BP (!) 156/77 (BP Location: Right Arm, Patient Position: Sitting, Cuff Size: Normal)   Pulse (!) 54   Temp 97.9 F (36.6 C) (Oral)   Ht 5\' 6"  (1.676 m)   Wt 178 lb 9.6 oz (81 kg)   SpO2 99%   BMI 28.83 kg/m   Physical Exam  Constitutional: She is oriented to person, place, and time. She appears well-developed and well-nourished. No distress.  HENT:  Head: Normocephalic and atraumatic.  Right Ear: External ear normal.  Left Ear: External ear normal.  Nose: Nose normal.  Mouth/Throat: Oropharynx is clear and moist.  Eyes: Pupils are equal, round, and reactive to light. Conjunctivae and EOM are normal. No scleral icterus.  Neck: Neck supple. No thyromegaly present.  Cardiovascular: Normal rate, regular rhythm, normal heart sounds and intact distal pulses.  No murmur heard. Pulmonary/Chest: Effort normal and breath sounds normal. No respiratory distress. She has no wheezes. She has no rales.  Abdominal: Soft. Bowel sounds are normal. She exhibits no distension. There is no tenderness. There is no rebound and no guarding.  Musculoskeletal: She exhibits no edema or deformity.  Lymphadenopathy:    She has no cervical adenopathy.  Neurological: She is alert and oriented to person, place, and time. No cranial nerve deficit or sensory deficit. She exhibits normal muscle tone.  Skin: Skin  is warm and dry. Capillary refill takes less than 2 seconds. No rash noted.  Psychiatric: She has a normal mood and affect. Her behavior is normal.  Vitals reviewed.   Activities of Daily Living In your present state of health, do you have any difficulty performing the following activities: 01/25/2018 11/09/2017  Hearing? N N  Vision? N N  Difficulty concentrating or making decisions? N N  Walking or climbing stairs? N N  Dressing or bathing? N N  Doing errands, shopping? N N  Preparing Food and eating ? N -  Using the Toilet? N -  In the past six months, have you accidently leaked urine? N -  Do you have problems with loss of bowel control? N -  Managing your Medications? N -  Managing your Finances? N -  Housekeeping or managing your Housekeeping? N -    Fall Risk Assessment Fall Risk  01/25/2018 11/09/2017  Falls in the past year? No No     Depression Screen PHQ 2/9 Scores 01/25/2018 11/09/2017  PHQ - 2 Score 0 0    Assessment & Plan:    Annual Physical Reviewed patient's Family Medical History Reviewed and updated list of patient's medical providers Assessment of cognitive impairment was done Assessed patient's functional ability Established a written schedule for health screening [place] Completed and Reviewed  Exercise Activities and Dietary recommendations Goals    . DIET - REDUCE SUGAR INTAKE     Continue current diet plan of cutting out sugar in daily diet to help aid in weight loss.         There is no immunization history on file for this patient.  Health Maintenance  Topic Date Due  . Hepatitis C Screening  05/22/1950  . COLONOSCOPY  05/31/1998  . DEXA SCAN  05/31/2013  . INFLUENZA VACCINE  07/10/2018 (Originally 11/09/2017)  . PNA vac Low Risk Adult (1 of 2 - PCV13) 02/10/2019 (Originally 05/31/2013)  . TETANUS/TDAP  02/10/2023 (Originally 06/01/1967)  . MAMMOGRAM  05/09/2019     Discussed health benefits of physical activity,  and  encouraged her to engage in regular exercise appropriate for her age and condition.    ------------------------------------------------------------------------------------------------------------  Problem List Items Addressed This Visit      Cardiovascular and Mediastinum   Essential (primary) hypertension    Uncontrolled Has been off of her medicine for ~2 months Will resume amlodipine 10mg  daily Reviewed recent labs F/u in 3 months      Relevant Medications   amLODipine (NORVASC) 10 MG tablet     Other   Breast cancer ([place])    We have discussed it previously She agrees to see medical oncologist to discuss options and make a more informed decision      Relevant Orders   Ambulatory referral to Oncology   Status post mastectomy, left    Followed by [facility] for seroma and other complications      Relevant Orders   Ambulatory referral to Oncology   H/O Hashimoto thyroiditis    Reviewed recent thyroid function - normal      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   The entirety of the information documented in the History of Present Illness, Review of Systems and Physical Exam were personally obtained by me. Portions of this information were initially documented by Tiburcio Pea and Ashley Royalty, CMA and reviewed by me for thoroughness and accuracy.    Virginia Crews, MD, MPH [facility] 02/09/2018 1:19 PM

## 2018-02-10 LAB — HEPATITIS C ANTIBODY

## 2018-02-16 ENCOUNTER — Inpatient Hospital Stay: Payer: Self-pay | Admitting: Oncology

## 2018-02-23 ENCOUNTER — Encounter: Payer: Self-pay | Admitting: Oncology

## 2018-02-23 ENCOUNTER — Inpatient Hospital Stay: Payer: Medicare HMO | Attending: Oncology | Admitting: Oncology

## 2018-02-23 VITALS — BP 172/88 | HR 56 | Temp 97.9°F | Resp 18 | Ht 66.0 in | Wt 178.7 lb

## 2018-02-23 DIAGNOSIS — Z170 Estrogen receptor positive status [ER+]: Secondary | ICD-10-CM | POA: Insufficient documentation

## 2018-02-23 DIAGNOSIS — C50812 Malignant neoplasm of overlapping sites of left female breast: Secondary | ICD-10-CM | POA: Insufficient documentation

## 2018-02-23 DIAGNOSIS — Z9012 Acquired absence of left breast and nipple: Secondary | ICD-10-CM | POA: Insufficient documentation

## 2018-02-23 DIAGNOSIS — C773 Secondary and unspecified malignant neoplasm of axilla and upper limb lymph nodes: Secondary | ICD-10-CM

## 2018-02-23 DIAGNOSIS — Z79899 Other long term (current) drug therapy: Secondary | ICD-10-CM | POA: Diagnosis not present

## 2018-02-23 DIAGNOSIS — C50912 Malignant neoplasm of unspecified site of left female breast: Secondary | ICD-10-CM

## 2018-02-23 DIAGNOSIS — Z79811 Long term (current) use of aromatase inhibitors: Secondary | ICD-10-CM | POA: Insufficient documentation

## 2018-02-23 DIAGNOSIS — Z7189 Other specified counseling: Secondary | ICD-10-CM

## 2018-02-23 MED ORDER — ANASTROZOLE 1 MG PO TABS: 1.0000 mg | ORAL_TABLET | Freq: Every day | ORAL | 1 refills | Status: DC

## 2018-02-26 NOTE — Progress Notes (Signed)
Hematology/Oncology Consult note [facility] Telephone:([phone] [phone]  Patient Care Team: Virginia Crews, MD as PCP - General (Family Medicine) Johny Blamer, [place] (Chiropractic Medicine) Debbora Dus, NP (Nurse Practitioner)   Name of the patient: Meredith Hamilton  [id number]  Aug 24, 1950    Reason for referral- h/o left breast cancer pathological prognostic stage IB T2N2M0 ER PR positive her 2 negative s/p mastectomy and axillary LN dissection   Referring physician- Dr. Brita Romp  Date of visit: 02/26/18   History of presenting illness- Patient is a 67 year old female who was found to have an abnormal screening mammogram at an outside facility in [place] in February 2019.  This was followed by a mastectomy and a sentinel lymph node biopsy in April 2019.  Intraoperative frozen section of the lymph node was positive and this was converted to an axillary lymph node dissection.  Final pathology revealed mixed pathology of invasive mammary as well as invasive lobular carcinoma.  There were 2 foci measuring 2.1 and 0.8 cm.  Overall grade 2.  ER PR strongly positive greater than 90% and HER-2 was negative.  5 out of 15 axillary lymph nodes were positive for metastatic disease with the largest deposit being 1.8 cm and extranodal extension was present.  Perineural invasion was identified.  No LV I.  Final margins were clear pathological stage T2N2A.  She was recommended adjuvant chemotherapy and postmastectomy radiation.  However patient did not want to go through it and eventually moved her care to [facility].  She has also been seen by surgical oncology in [facility] recently in July 2019.  She has been pursuing alternative therapy with vitamin C infusions as well as other nutritional supplements.  She is not started on any endocrine therapy.  Patient is currently doing well and reports mild fatigue.  Her appetite is good and she denies any unintentional  weight loss.  She does not report any upper extremity edema or swelling  ECOG PS- 1  Pain scale- 0   Review of systems- Review of Systems  Constitutional: Positive for malaise/fatigue. Negative for chills, fever and weight loss.  HENT: Negative for congestion, ear discharge and nosebleeds.   Eyes: Negative for blurred vision.  Respiratory: Negative for cough, hemoptysis, sputum production, shortness of breath and wheezing.   Cardiovascular: Negative for chest pain, palpitations, orthopnea and claudication.  Gastrointestinal: Negative for abdominal pain, blood in stool, constipation, diarrhea, heartburn, melena, nausea and vomiting.  Genitourinary: Negative for dysuria, flank pain, frequency, hematuria and urgency.  Musculoskeletal: Negative for back pain, joint pain and myalgias.  Skin: Negative for rash.  Neurological: Negative for dizziness, tingling, focal weakness, seizures, weakness and headaches.  Endo/Heme/Allergies: Does not bruise/bleed easily.  Psychiatric/Behavioral: Negative for depression and suicidal ideas. The patient does not have insomnia.     Allergies  Allergen Reactions  . Sulfa Antibiotics Swelling  . Tramadol Other (See Comments)    Other reaction(s): Hallucination    Patient Active Problem List   Diagnosis Date Noted  . Breast cancer ([place]) 11/08/2017  . Status post mastectomy, left 11/08/2017  . Malignant neoplasm of overlapping sites of left breast in female, estrogen receptor positive ([place]) 10/27/2017  . Anxiety 07/22/2017  . H/O Hashimoto thyroiditis 07/22/2017  . 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 Essential (primary) hypertension 11/08/2004  . Overweight 11/08/2004  . Rheumatoid arthritis ([place]) 06/07/2001     Past Medical History:  Diagnosis Date  . Breast CA ([place])   . Hashimoto's  disease   . Hypertension   . Non-celiac gluten sensitivity      Past Surgical History:  Procedure Laterality Date  . APPENDECTOMY  1979    . [place]  . MASTECTOMY W/ SENTINEL NODE BIOPSY Left 2019  . RIGHT OOPHORECTOMY  1979   due to ovarian torsion  . URETEROSCOPY Right 2017    Social History   Socioeconomic History  . Marital status: Divorced    Spouse name: Not on file  . Number of children: 2  . Years of education: 88  . Highest education level: High school graduate  Occupational History  . Occupation: retired Sales promotion account executive for [employer]  . Financial resource strain: Not hard at all  . Food insecurity:    Worry: Never true    Inability: Never true  . Transportation needs:    Medical: No    Non-medical: No  Tobacco Use  . Smoking status: Never Smoker  . Smokeless tobacco: Never Used  Substance and Sexual Activity  . Alcohol use: Not Currently    Frequency: Never  . Drug use: Never  . Sexual activity: Not Currently  Lifestyle  . Physical activity:    Days per week: 5 days    Minutes per session: 60 min  . Stress: Only a little  Relationships  . Social connections:    Talks on phone: Patient refused    Gets together: Patient refused    Attends religious service: Patient refused    Active member of club or organization: Patient refused    Attends meetings of clubs or organizations: Patient refused    Relationship status: Patient refused  . Intimate partner violence:    Fear of current or ex partner: Patient refused    Emotionally abused: Patient refused    Physically abused: Patient refused    Forced sexual activity: Patient refused  Other Topics Concern  . Not on file  Social History Narrative  . Not on file     Family History  Problem Relation Age of Onset  . Lung cancer Mother        smoker  . Lung cancer Father        smoker  . Breast cancer Sister 74       in remission, now in mid 50s  . Lung cancer Brother        smoker  . Throat cancer Brother        alcohol  . Colon cancer Neg Hx   . Cervical cancer Neg Hx   . Ovarian cancer Neg Hx       Current Outpatient Medications:  .  amLODipine (NORVASC) 10 MG tablet, Take 1 tablet (10 mg total) by mouth daily., Disp: 90 tablet, Rfl: 3 .  Cholecalciferol (VITAMIN D3) LIQD, Take 6 drops by mouth daily., Disp: , Rfl:  .  Homeopathic Products (FRANKINCENSE UPLIFTING) OIL, Take 7 drops by mouth daily., Disp: , Rfl:  .  Misc Natural Products (SPLEEN) CAPS, Take 2 capsules by mouth daily., Disp: , Rfl:  .  OVER THE COUNTER MEDICATION, Take 4 Scoops by mouth daily. Pecta Sol-C (Mod. Citrus Pectin), Disp: , Rfl:  .  OVER THE COUNTER MEDICATION, Take 2 Scoops by mouth daily. Fermented Mushroom Blend, Disp: , Rfl:  .  OVER THE COUNTER MEDICATION, Take 6 capsules by mouth daily. Cruciferous Complete, Disp: , Rfl:  .  OVER THE COUNTER MEDICATION, Nitric Balance 4 tsp daily, Disp: , Rfl:  .  Specialty Vitamins Products (MAMMARY) TABS, Take 2 tablets by mouth daily., Disp: , Rfl:  .  TURMERIC PO, Take 7 drops by mouth daily., Disp: , Rfl:  .  UNABLE TO FIND, Take 6 tablets by mouth daily. Med Name: Vitamin B 17, Disp: , Rfl:  .  UNABLE TO FIND, Take 10 drops by mouth daily. Med Name: Liquid Vitamin A, Disp: , Rfl:  .  UNABLE TO FIND, Med Name: Hydrex 2 vials daily, Disp: , Rfl:  .  UNABLE TO FIND, Med Name: Proglyco-SP 3 every other day, Disp: , Rfl:  .  UNABLE TO FIND, Med Name: Hemp Oil Complete 2 daily, Disp: , Rfl:  .  UNABLE TO FIND, Med Name: Artemisinin Complex 2 daily, Disp: , Rfl:  .  UNABLE TO FIND, Med Name: Adrena Calm 1/4 pump daily, Disp: , Rfl:  .  UNABLE TO FIND, Med Name: Immuno Plus 3 drops daily, Disp: , Rfl:  .  UNABLE TO FIND, Med Name: Rockne Menghini Glutathione 5 tsp daily, Disp: , Rfl:  .  UNABLE TO FIND, Med Name: Vitamin C Drip 20,000 once weekly, Disp: , Rfl:  .  UNABLE TO FIND, Med Name: Myrrh 3 drops daily, Disp: , Rfl:  .  UNABLE TO FIND, Med Name: Welford Roche Vite 6 per day, Disp: , Rfl:  .  UNABLE TO FIND, Med Name: Pea Protein Isolate 1 scoop per day, Disp: , Rfl:  .   UNABLE TO FIND, Med Name: Nourish Greens 1 scoop per day, Disp: , Rfl:  .  anastrozole (ARIMIDEX) 1 MG tablet, Take 1 tablet (1 mg total) by mouth daily., Disp: 30 tablet, Rfl: 1   Physical exam:  Vitals:   02/23/18 1342  BP: (!) 172/88  Pulse: (!) 56  Resp: 18  Temp: 97.9 F (36.6 C)  TempSrc: Tympanic  SpO2: 95%  Weight: 178 lb 11.2 oz (81.1 kg)  Height: '5\' 6"'  (1.676 m)   Physical Exam  Constitutional: She is oriented to person, place, and time. She appears well-developed and well-nourished.  HENT:  Head: Normocephalic and atraumatic.  Eyes: Pupils are equal, round, and reactive to light. EOM are normal.  Neck: Normal range of motion.  Cardiovascular: Normal rate, regular rhythm and normal heart sounds.  Pulmonary/Chest: Effort normal and breath sounds normal.  Abdominal: Soft. Bowel sounds are normal.  Neurological: She is alert and oriented to person, place, and time.  Skin: Skin is warm and dry.  Breast exam performed in sitting and laying down position.  No evidence of palpable bilateral axillary adenopathy.  No palpable breast masses in the right breast.  Patient is status post left mastectomy without reconstruction and a well-healed surgical scar.  No evidence of chest wall recurrence.  There are some pockets of soft tissue swelling which likely represents seroma    CMP Latest Ref Rng & Units 01/16/2018  BUN 4 - 21 17  Creatinine 0.5 - 1.1 0.7  Sodium 137 - 147 144  Potassium 3.4 - 5.3 4.3  Chloride - 106  CO2 - 20  Calcium - 9.8  Total Protein g/dL 6.5  Alkaline Phos 25 - 125 69  AST 13 - 35 20  ALT 7 - 35 15   CBC Latest Ref Rng & Units 01/16/2018  WBC - 4.5  Hemoglobin 12.0 - 16.0 12.2  Hematocrit 36 - 46 36  Platelets 150 - 399 156     Assessment and plan- Patient is a 67 y.o. female history of stage Ib pathological  prognostic stage T2N2A grade 2 invasive mammary carcinoma with mixed ductal and lobular features ER and PR greater than 90% positive and  HER-2/neu negative status post left mastectomy and axillary lymph node dissection  Patient had multiple risk features on her final pathology including 5 lymph nodes positive and extracapsular extension which would have definitely warranted adjuvant chemotherapy despite a low Ki-67 of less than 5%.  However patient did not wish to go to chemotherapy and it has been 7 months since her surgery and there would be no role for any adjuvant chemotherapy at this time  Patient would have also benefited from postmastectomy radiation given 5 lymph nodes positivity and extracapsular extension which again the patient refused in the past and there would be no role for radiation at this time.  Given that her tumor is strongly ER and PR positive I would recommend at least 5 if not 10 years of endocrine therapy.  Discussed risks and benefits of AI including all but not limited to mood swings, hot flashes, arthralgias and worsening bone health.  I would recommend that she should start on Arimidex 1 mg p.o. daily along with calcium and vitamin D 1200 mg and 800 international units respectively.  Patient understands and agrees to proceed.  Written information about Arimidex has been given to the patient.  Treatment will be given with a curative intent.  We will also obtain a baseline bone density scan at this time.  I will see her back in 6 weeks time with CMP to see how she is tolerating her Arimidex.  Based on the extent of her lymph node involvement and extracapsular extension I would like to get CT chest abdomen and pelvis with contrast and a bone scan to a certain if she has any metastatic disease at baseline pending insurance approval.  We will try to arrange that in the next 2 to 3 weeks  Patient is already established contact with [facility] surgery and they will be scheduling her subsequent right breast mammograms  Cancer Staging Malignant neoplasm of overlapping sites of left breast in female, estrogen receptor positive  ([place]) Staging form: Breast, AJCC 8th Edition - Pathologic stage from 02/26/2018: Stage IB (pT2, pN2a, cM0, G2, ER+, PR+, HER2-) - Signed by Sindy Guadeloupe, MD on 02/26/2018     Total face to face encounter time for this patient visit was 40 min. >50% of the time was  spent in counseling and coordination of care. >30 min spent separately and reviewing outside medical records.  Thank you for this kind referral and the opportunity to participate in the care of this patient   Visit Diagnosis 1. Malignant neoplasm of left breast in female, estrogen receptor positive, unspecified site of breast ([place])   2. Malignant neoplasm of overlapping sites of left breast in female, estrogen receptor positive ([place])   3. Goals of care, counseling/discussion     Dr. Randa Evens, MD, MPH [facility] [phone] 02/26/2018 10:59 AM

## 2018-03-06 ENCOUNTER — Telehealth: Payer: Self-pay | Admitting: *Deleted

## 2018-03-06 ENCOUNTER — Other Ambulatory Visit: Payer: Self-pay | Admitting: *Deleted

## 2018-03-06 NOTE — Telephone Encounter (Signed)
Called patient to let her know that we finally got approval for her to have a CT scan as well as a whole-body bone scan.  The date for the scan is December 16 and she would have the CT scan done first.  The scan is scheduled for 8 AM so she would need to arrive at the medical mall at [facility] and check-in at 745.  She is to be nothing to eat or drink 4 hours prior to the scan of the CT. she will need to pick up a contrast kit prior to her CT. she was instructed that when she comes to pick up her contrast kit it will have instructions of when to drink the contrast.  As soon as she gets out of the CT she will directly go for a injection for the bone scan at 9 AM.  Then we have to wait for the injection medicine to run through her body and she would come back to the hospital at 12 noon to get the scan for the whole body bone scan.  I told patient that she is able to either stay at the hospital may be go get something to eat or read a magazine or go shopping just as long as she is back [facility] the [facility] at 12 noon.  Patient is agreeable to the appointment on December 16.  She will come over and get the contrast kit before she leaves on [place] of [place].  I told her I still have to work out a bone density scheduled for her and I will let her know.  She is agreeable to this plan.  I will write out all the instructions and mail it to her with her appointment information.

## 2018-03-16 ENCOUNTER — Telehealth: Payer: Self-pay | Admitting: *Deleted

## 2018-03-16 NOTE — Telephone Encounter (Signed)
Received a call from patient today that she did get her appointment schedule in the mail.  I did reschedule her CT scan till December 19 and I gave her the new date and times.  Patient looked on my chart and thought also when she wrote it beside of the old dates and put the new dates for the CT and bone scan.She says she does not have any questions and if she does she will let us know

## 2018-03-26 ENCOUNTER — Ambulatory Visit: Payer: Medicare HMO

## 2018-03-26 ENCOUNTER — Other Ambulatory Visit: Payer: Medicare HMO

## 2018-03-29 ENCOUNTER — Encounter
Admission: RE | Admit: 2018-03-29 | Discharge: 2018-03-29 | Disposition: A | Discharge status: Home / Self Care | Payer: Medicare HMO | Source: Ambulatory Visit | Attending: Oncology | Admitting: Oncology

## 2018-03-29 ENCOUNTER — Ambulatory Visit
Admission: RE | Admit: 2018-03-29 | Discharge: 2018-03-29 | Disposition: A | Discharge status: Home / Self Care | Payer: Medicare HMO | Source: Ambulatory Visit | Attending: Oncology | Admitting: Oncology

## 2018-03-29 DIAGNOSIS — C50912 Malignant neoplasm of unspecified site of left female breast: Secondary | ICD-10-CM

## 2018-03-29 DIAGNOSIS — Z170 Estrogen receptor positive status [ER+]: Secondary | ICD-10-CM | POA: Insufficient documentation

## 2018-03-29 LAB — POCT I-STAT CREATININE: Creatinine, Ser: 0.6 mg/dL (ref 0.44–1.00)

## 2018-03-29 MED ORDER — TECHNETIUM TC 99M MEDRONATE IV KIT
22.0000 | PACK | Freq: Once | INTRAVENOUS | Status: AC | PRN
  Administered 2018-03-29: 22.952 via INTRAVENOUS

## 2018-03-29 MED ORDER — IOPAMIDOL (ISOVUE-300) INJECTION 61%
100.0000 mL | Freq: Once | INTRAVENOUS | Status: AC | PRN
  Administered 2018-03-29: 100 mL via INTRAVENOUS

## 2018-04-06 ENCOUNTER — Inpatient Hospital Stay: Payer: Medicare HMO

## 2018-04-06 ENCOUNTER — Inpatient Hospital Stay: Payer: Medicare HMO | Attending: Oncology | Admitting: Oncology

## 2018-04-18 ENCOUNTER — Other Ambulatory Visit: Payer: Self-pay | Admitting: *Deleted

## 2018-04-18 DIAGNOSIS — C50912 Malignant neoplasm of unspecified site of left female breast: Secondary | ICD-10-CM

## 2018-04-18 DIAGNOSIS — Z170 Estrogen receptor positive status [ER+]: Principal | ICD-10-CM

## 2018-04-23 ENCOUNTER — Inpatient Hospital Stay: Payer: Medicare HMO | Attending: Oncology | Admitting: Oncology

## 2018-04-23 ENCOUNTER — Encounter: Payer: Self-pay | Admitting: Oncology

## 2018-04-23 ENCOUNTER — Inpatient Hospital Stay: Payer: Medicare HMO

## 2018-04-23 ENCOUNTER — Ambulatory Visit
Admission: RE | Admit: 2018-04-23 | Discharge: 2018-04-23 | Disposition: A | Discharge status: Home / Self Care | Payer: Medicare HMO | Source: Ambulatory Visit | Attending: Oncology | Admitting: Oncology

## 2018-04-23 VITALS — BP 138/76 | HR 69 | Temp 98.7°F | Resp 18 | Wt 177.5 lb

## 2018-04-23 DIAGNOSIS — Z79811 Long term (current) use of aromatase inhibitors: Secondary | ICD-10-CM | POA: Insufficient documentation

## 2018-04-23 DIAGNOSIS — I10 Essential (primary) hypertension: Secondary | ICD-10-CM | POA: Diagnosis not present

## 2018-04-23 DIAGNOSIS — C50912 Malignant neoplasm of unspecified site of left female breast: Secondary | ICD-10-CM

## 2018-04-23 DIAGNOSIS — Z170 Estrogen receptor positive status [ER+]: Secondary | ICD-10-CM | POA: Diagnosis present

## 2018-04-23 DIAGNOSIS — Z5181 Encounter for therapeutic drug level monitoring: Secondary | ICD-10-CM

## 2018-04-23 DIAGNOSIS — M85851 Other specified disorders of bone density and structure, right thigh: Secondary | ICD-10-CM | POA: Insufficient documentation

## 2018-04-23 DIAGNOSIS — R918 Other nonspecific abnormal finding of lung field: Secondary | ICD-10-CM

## 2018-04-23 DIAGNOSIS — Z79899 Other long term (current) drug therapy: Secondary | ICD-10-CM

## 2018-04-23 LAB — COMPREHENSIVE METABOLIC PANEL
ALK PHOS: 69 U/L (ref 38–126)
ALT: 15 U/L (ref 0–44)
AST: 22 U/L (ref 15–41)
Albumin: 4.5 g/dL (ref 3.5–5.0)
Anion gap: 7 (ref 5–15)
BUN: 24 mg/dL — ABNORMAL HIGH (ref 8–23)
CALCIUM: 9.6 mg/dL (ref 8.9–10.3)
CO2: 27 mmol/L (ref 22–32)
CREATININE: 0.64 mg/dL (ref 0.44–1.00)
Chloride: 108 mmol/L (ref 98–111)
GFR calc Af Amer: >60 mL/min (ref >60)
GFR calc non Af Amer: >60 mL/min (ref >60)
Glucose, Bld: 84 mg/dL (ref 70–99)
Potassium: 3.6 mmol/L (ref 3.5–5.1)
Sodium: 142 mmol/L (ref 135–145)
Total Bilirubin: 1.2 mg/dL (ref 0.3–1.2)
Total Protein: 7.3 g/dL (ref 6.5–8.1)

## 2018-04-23 MED ORDER — ANASTROZOLE 1 MG PO TABS: 1.0000 mg | ORAL_TABLET | Freq: Every day | ORAL | 6 refills | Status: DC

## 2018-04-23 NOTE — Progress Notes (Signed)
Taking arimidex as ordered. Has some mild hot flashes. Has R hip pain intermitently. Appetite is great. Energy is improving.

## 2018-04-23 NOTE — Progress Notes (Signed)
Hematology/Oncology Consult note [facility]  [phone] [phone]  Patient Care Team: Virginia Crews, MD as PCP - General (Family Medicine) Johny Blamer, [place] (Chiropractic Medicine) Debbora Dus, NP (Nurse Practitioner)   Name of the patient: Meredith Hamilton  [id number]  19-Oct-1950   Date of visit: 04/23/18  Diagnosis- h/o left breast cancer pathological prognostic stage IB T2N2M0 ER PR positive her 2 negative s/p mastectomy and axillary LN dissection     Chief complaint/ Reason for visit-discuss results of CT scan  Heme/Onc history: Patient is a 68 year old female who was found to have an abnormal screening mammogram at an outside facility in [place] in February 2019.  This was followed by a mastectomy and a sentinel lymph node biopsy in April 2019.  Intraoperative frozen section of the lymph node was positive and this was converted to an axillary lymph node dissection.  Final pathology revealed mixed pathology of invasive mammary as well as invasive lobular carcinoma.  There were 2 foci measuring 2.1 and 0.8 cm.  Overall grade 2.  ER PR strongly positive greater than 90% and HER-2 was negative.  5 out of 15 axillary lymph nodes were positive for metastatic disease with the largest deposit being 1.8 cm and extranodal extension was present.  Perineural invasion was identified.  No LV I.  Final margins were clear pathological stage T2N2A.  She was recommended adjuvant chemotherapy and postmastectomy radiation.  However patient did not want to go through it and eventually moved her care to [facility].  She has also been seen by surgical oncology in [facility] recently in July 2019.  She has been pursuing alternative therapy with vitamin C infusions as well as other nutritional supplements.    Patient was started on Arimidex in November 2019  Interval history-overall she reports doing well.  Her appetite is good and she denies any  unintentional weight loss.  Denies any new aches or pains anywhere.  She is tolerating Arimidex well except for mild self-limited hot flashes.  She is also taking her calcium and vitamin D.  ECOG PS- 1 Pain scale- 0 Opioid associated constipation- no  Review of systems- Review of Systems  Constitutional: Negative for chills, fever, malaise/fatigue and weight loss.  HENT: Negative for congestion, ear discharge and nosebleeds.   Eyes: Negative for blurred vision.  Respiratory: Negative for cough, hemoptysis, sputum production, shortness of breath and wheezing.   Cardiovascular: Negative for chest pain, palpitations, orthopnea and claudication.  Gastrointestinal: Negative for abdominal pain, blood in stool, constipation, diarrhea, heartburn, melena, nausea and vomiting.  Genitourinary: Negative for dysuria, flank pain, frequency, hematuria and urgency.  Musculoskeletal: Negative for back pain, joint pain and myalgias.  Skin: Negative for rash.  Neurological: Negative for dizziness, tingling, focal weakness, seizures, weakness and headaches.  Endo/Heme/Allergies: Does not bruise/bleed easily.       Hot flashes  Psychiatric/Behavioral: Negative for depression and suicidal ideas. The patient does not have insomnia.       Allergies  Allergen Reactions  . Sulfa Antibiotics Swelling  . Tramadol Other (See Comments)    Other reaction(s): Hallucination     Past Medical History:  Diagnosis Date  . Breast CA ([place])   . Hashimoto's disease   . Hypertension   . Non-celiac gluten sensitivity      Past Surgical History:  Procedure Laterality Date  . APPENDECTOMY  1979  . [place]  . MASTECTOMY W/ SENTINEL NODE BIOPSY Left 2019  .  RIGHT OOPHORECTOMY  1979   due to ovarian torsion  . URETEROSCOPY Right 2017    Social History   Socioeconomic History  . Marital status: Divorced    Spouse name: Not on file  . Number of children: 2  . Years of education: 35  .  Highest education level: High school graduate  Occupational History  . Occupation: retired Sales promotion account executive for [employer]  . Financial resource strain: Not hard at all  . Food insecurity:    Worry: Never true    Inability: Never true  . Transportation needs:    Medical: No    Non-medical: No  Tobacco Use  . Smoking status: Never Smoker  . Smokeless tobacco: Never Used  Substance and Sexual Activity  . Alcohol use: Not Currently    Frequency: Never  . Drug use: Never  . Sexual activity: Not Currently  Lifestyle  . Physical activity:    Days per week: 5 days    Minutes per session: 60 min  . Stress: Only a little  Relationships  . Social connections:    Talks on phone: Patient refused    Gets together: Patient refused    Attends religious service: Patient refused    Active member of club or organization: Patient refused    Attends meetings of clubs or organizations: Patient refused    Relationship status: Patient refused  . Intimate partner violence:    Fear of current or ex partner: Patient refused    Emotionally abused: Patient refused    Physically abused: Patient refused    Forced sexual activity: Patient refused  Other Topics Concern  . Not on file  Social History Narrative  . Not on file    Family History  Problem Relation Age of Onset  . Lung cancer Mother        smoker  . Lung cancer Father        smoker  . Breast cancer Sister 45       in remission, now in mid 30s  . Lung cancer Brother        smoker  . Throat cancer Brother        alcohol  . Colon cancer Neg Hx   . Cervical cancer Neg Hx   . Ovarian cancer Neg Hx      Current Outpatient Medications:  .  amLODipine (NORVASC) 10 MG tablet, Take 1 tablet (10 mg total) by mouth daily., Disp: 90 tablet, Rfl: 3 .  anastrozole (ARIMIDEX) 1 MG tablet, Take 1 tablet (1 mg total) by mouth daily., Disp: 30 tablet, Rfl: 6 .  Cholecalciferol (VITAMIN D3) LIQD, Take 6 drops by mouth daily.,  Disp: , Rfl:  .  Homeopathic Products (FRANKINCENSE UPLIFTING) OIL, Take 7 drops by mouth daily., Disp: , Rfl:  .  Misc Natural Products (SPLEEN) CAPS, Take 2 capsules by mouth daily., Disp: , Rfl:  .  OVER THE COUNTER MEDICATION, Take 4 Scoops by mouth daily. Pecta Sol-C (Mod. Citrus Pectin), Disp: , Rfl:  .  OVER THE COUNTER MEDICATION, Take 2 Scoops by mouth daily. Fermented Mushroom Blend, Disp: , Rfl:  .  OVER THE COUNTER MEDICATION, Take 6 capsules by mouth daily. Cruciferous Complete, Disp: , Rfl:  .  OVER THE COUNTER MEDICATION, Nitric Balance 4 tsp daily, Disp: , Rfl:  .  Specialty Vitamins Products (MAMMARY) TABS, Take 2 tablets by mouth daily., Disp: , Rfl:  .  TURMERIC PO, Take 7 drops by mouth daily., Disp: ,  Rfl:  .  UNABLE TO FIND, Take 6 tablets by mouth daily. Med Name: Vitamin B 17, Disp: , Rfl:  .  UNABLE TO FIND, Take 10 drops by mouth daily. Med Name: Liquid Vitamin A, Disp: , Rfl:  .  UNABLE TO FIND, Med Name: Hydrex 2 vials daily, Disp: , Rfl:  .  UNABLE TO FIND, Med Name: Proglyco-SP 3 every other day, Disp: , Rfl:  .  UNABLE TO FIND, Med Name: Hemp Oil Complete 2 daily, Disp: , Rfl:  .  UNABLE TO FIND, Med Name: Artemisinin Complex 2 daily, Disp: , Rfl:  .  UNABLE TO FIND, Med Name: Adrena Calm 1/4 pump daily, Disp: , Rfl:  .  UNABLE TO FIND, Med Name: Immuno Plus 3 drops daily, Disp: , Rfl:  .  UNABLE TO FIND, Med Name: Rockne Menghini Glutathione 5 tsp daily, Disp: , Rfl:  .  UNABLE TO FIND, Med Name: Vitamin C Drip 20,000 once weekly, Disp: , Rfl:  .  UNABLE TO FIND, Med Name: Myrrh 3 drops daily, Disp: , Rfl:  .  UNABLE TO FIND, Med Name: Welford Roche Vite 6 per day, Disp: , Rfl:  .  UNABLE TO FIND, Med Name: Pea Protein Isolate 1 scoop per day, Disp: , Rfl:  .  UNABLE TO FIND, Med Name: Alen Blew 1 scoop per day, Disp: , Rfl:   Physical exam:  Vitals:   04/23/18 1022  BP: 138/76  Pulse: 69  Resp: 18  Temp: 98.7 F (37.1 C)  Weight: 177 lb 8 oz (80.5 kg)    Physical Exam HENT:     Head: Normocephalic and atraumatic.  Eyes:     Pupils: Pupils are equal, round, and reactive to light.  Neck:     Musculoskeletal: Normal range of motion.  Cardiovascular:     Rate and Rhythm: Normal rate and regular rhythm.     Heart sounds: Normal heart sounds.  Pulmonary:     Effort: Pulmonary effort is normal.     Breath sounds: Normal breath sounds.  Abdominal:     General: Bowel sounds are normal.     Palpations: Abdomen is soft.  Skin:    General: Skin is warm and dry.  Neurological:     Mental Status: She is alert and oriented to person, place, and time.      CMP Latest Ref Rng & Units 04/23/2018  Glucose 70 - 99 mg/dL 84  BUN 8 - 23 mg/dL 24(H)  Creatinine 0.44 - 1.00 mg/dL 0.64  Sodium 135 - 145 mmol/L 142  Potassium 3.5 - 5.1 mmol/L 3.6  Chloride 98 - 111 mmol/L 108  CO2 22 - 32 mmol/L 27  Calcium 8.9 - 10.3 mg/dL 9.6  Total Protein 6.5 - 8.1 g/dL 7.3  Total Bilirubin 0.3 - 1.2 mg/dL 1.2  Alkaline Phos 38 - 126 U/L 69  AST 15 - 41 U/L 22  ALT 0 - 44 U/L 15   CBC Latest Ref Rng & Units 01/16/2018  WBC - 4.5  Hemoglobin 12.0 - 16.0 12.2  Hematocrit 36 - 46 36  Platelets 150 - 399 156    No images are attached to the encounter.  Ct Chest W Contrast  Result Date: 03/29/2018 CLINICAL DATA:  Stage IB left breast cancer with positive lymph nodes. Staging evaluation. EXAM: CT CHEST, ABDOMEN, AND PELVIS WITH CONTRAST TECHNIQUE: Multidetector CT imaging of the chest, abdomen and pelvis was performed following the standard protocol during bolus administration of intravenous contrast. CONTRAST:  129m ISOVUE-300 IOPAMIDOL (ISOVUE-300) INJECTION 61% COMPARISON:  None. FINDINGS: CT CHEST FINDINGS Cardiovascular: Borderline mild cardiomegaly. No significant pericardial effusion/thickening. Left anterior descending coronary atherosclerosis. Atherosclerotic nonaneurysmal thoracic aorta. Borderline prominent main pulmonary artery (3.4 cm diameter).  No central pulmonary emboli. Mediastinum/Nodes: Heterogeneous thyroid parenchyma without discrete thyroid nodules. Unremarkable esophagus. No pathologically enlarged axillary, mediastinal or hilar lymph nodes. Mild postsurgical scarring noted in the left axilla. Lungs/Pleura: No pneumothorax. No pleural effusion. Subpleural 3 mm superior segment right lower lobe pulmonary nodule along the major fissure (series 4/image 69). Subpleural peripheral right upper lobe 3 mm solid pulmonary nodule (series 4/image 69). Three scattered tiny solid pulmonary nodules in the left lower lobe, largest 3 mm (series 4/image 121). No acute consolidative airspace disease or lung masses. Musculoskeletal: No aggressive appearing focal osseous lesions. Mild thoracic spondylosis. Status post left mastectomy. Small fluid collection along the ventral left pectoralis muscle measuring 5.8 x 0.8 x 8.1 cm (series 2/image 31), probably postsurgical. CT ABDOMEN PELVIS FINDINGS Hepatobiliary: Normal liver size. Small 1.7 cm hypervascular focus in the superior right liver lobe (series 2/image 51), potentially a venovenous shunt. No additional liver lesions. Normal gallbladder with no radiopaque cholelithiasis. No biliary ductal dilatation. Pancreas: Normal, with no mass or duct dilation. Spleen: Normal size. No mass. Adrenals/Urinary Tract: Normal adrenals. Nonobstructing 7 mm lower right renal stone. Mild fullness of the renal collecting systems bilaterally without overt hydronephrosis and mild ectasia of the ureters bilaterally, probably due to moderate bladder distention. Hypodense 1.8 cm renal cortical lesion in the lateral upper left kidney demonstrates density 24 HU (series 7/image 19) and exophytic 1.2 cm renal cortical lesion in the posterior lower right kidney (series 7/image 37) demonstrates density 25 HU, technically indeterminate. Simple exophytic 1.4 cm medial upper right renal cyst. Several additional subcentimeter hypodense renal  cortical lesions in both kidneys are too small to characterize. Moderately distended and otherwise normal bladder. Stomach/Bowel: Normal non-distended stomach. Normal caliber small bowel with no small bowel wall thickening. Appendectomy. Moderate diffuse colonic diverticulosis, most prominent in the sigmoid colon, with no large bowel wall thickening or significant pericolonic fat stranding. Vascular/Lymphatic: Normal caliber abdominal aorta. Patent portal, splenic, hepatic and renal veins. No pathologically enlarged lymph nodes in the abdomen or pelvis. Reproductive: Anteverted uterus is minimally enlarged and mildly heterogeneous with scattered internal calcifications, can not exclude small uterine fibroids. No adnexal mass. Other: No pneumoperitoneum, ascites or focal fluid collection. Musculoskeletal: No aggressive appearing focal osseous lesions. IMPRESSION: 1. No findings highly suspicious for metastatic disease. 2. Few scattered tiny pulmonary nodules in both lungs, largest 3 mm, which warrant attention on follow-up chest CT in 3-6 months. 3. Small hypervascular focus in the superior right liver lobe, nonspecific, favor a benign vascular shunt. Recommend attention on follow-up MRI abdomen without and with IV contrast in 3 months. 4. Small indeterminate hypodense bilateral renal cortical lesions, which can also be further evaluated on MRI abdomen without and with IV contrast in 3 months. 5. Small fluid collection in the ventral left chest wall along the left pectoralis muscle, probably postsurgical. 6. Borderline mild cardiomegaly with borderline prominent main pulmonary artery. 7. 1 vessel coronary atherosclerosis. 8. Moderate diffuse colonic diverticulosis. 9.  Aortic Atherosclerosis (ICD10-I70.0). Electronically Signed   By: JIlona SorrelM.D.   On: 03/29/2018 16:09   Nm Bone Scan Whole Body  Result Date: 03/29/2018 CLINICAL DATA:  Breast cancer post LEFT mastectomy EXAM: NUCLEAR MEDICINE WHOLE BODY BONE  SCAN TECHNIQUE: Whole body anterior and posterior images were obtained approximately  3 hours after intravenous injection of radiopharmaceutical. RADIOPHARMACEUTICALS:  22.95 mCi Technetium-84mMDP IV COMPARISON:  None Radiographic correlation: CT chest abdomen pelvis 03/29/2018 FINDINGS: Mild uptake at the shoulders, sternoclavicular joints, wrists, hips, knees and feet, typically degenerative. No worrisome sites of abnormal osseous tracer accumulation are identified to suggest osseous metastatic disease. Expected urinary tract and soft tissue distribution of tracer. IMPRESSION: No scintigraphic evidence of osseous metastatic disease. Electronically Signed   By: MLavonia DanaM.D.   On: 03/29/2018 17:16   Ct Abdomen Pelvis W Contrast  Result Date: 03/29/2018 CLINICAL DATA:  Stage IB left breast cancer with positive lymph nodes. Staging evaluation. EXAM: CT CHEST, ABDOMEN, AND PELVIS WITH CONTRAST TECHNIQUE: Multidetector CT imaging of the chest, abdomen and pelvis was performed following the standard protocol during bolus administration of intravenous contrast. CONTRAST:  105mISOVUE-300 IOPAMIDOL (ISOVUE-300) INJECTION 61% COMPARISON:  None. FINDINGS: CT CHEST FINDINGS Cardiovascular: Borderline mild cardiomegaly. No significant pericardial effusion/thickening. Left anterior descending coronary atherosclerosis. Atherosclerotic nonaneurysmal thoracic aorta. Borderline prominent main pulmonary artery (3.4 cm diameter). No central pulmonary emboli. Mediastinum/Nodes: Heterogeneous thyroid parenchyma without discrete thyroid nodules. Unremarkable esophagus. No pathologically enlarged axillary, mediastinal or hilar lymph nodes. Mild postsurgical scarring noted in the left axilla. Lungs/Pleura: No pneumothorax. No pleural effusion. Subpleural 3 mm superior segment right lower lobe pulmonary nodule along the major fissure (series 4/image 69). Subpleural peripheral right upper lobe 3 mm solid pulmonary nodule (series  4/image 69). Three scattered tiny solid pulmonary nodules in the left lower lobe, largest 3 mm (series 4/image 121). No acute consolidative airspace disease or lung masses. Musculoskeletal: No aggressive appearing focal osseous lesions. Mild thoracic spondylosis. Status post left mastectomy. Small fluid collection along the ventral left pectoralis muscle measuring 5.8 x 0.8 x 8.1 cm (series 2/image 31), probably postsurgical. CT ABDOMEN PELVIS FINDINGS Hepatobiliary: Normal liver size. Small 1.7 cm hypervascular focus in the superior right liver lobe (series 2/image 51), potentially a venovenous shunt. No additional liver lesions. Normal gallbladder with no radiopaque cholelithiasis. No biliary ductal dilatation. Pancreas: Normal, with no mass or duct dilation. Spleen: Normal size. No mass. Adrenals/Urinary Tract: Normal adrenals. Nonobstructing 7 mm lower right renal stone. Mild fullness of the renal collecting systems bilaterally without overt hydronephrosis and mild ectasia of the ureters bilaterally, probably due to moderate bladder distention. Hypodense 1.8 cm renal cortical lesion in the lateral upper left kidney demonstrates density 24 HU (series 7/image 19) and exophytic 1.2 cm renal cortical lesion in the posterior lower right kidney (series 7/image 37) demonstrates density 25 HU, technically indeterminate. Simple exophytic 1.4 cm medial upper right renal cyst. Several additional subcentimeter hypodense renal cortical lesions in both kidneys are too small to characterize. Moderately distended and otherwise normal bladder. Stomach/Bowel: Normal non-distended stomach. Normal caliber small bowel with no small bowel wall thickening. Appendectomy. Moderate diffuse colonic diverticulosis, most prominent in the sigmoid colon, with no large bowel wall thickening or significant pericolonic fat stranding. Vascular/Lymphatic: Normal caliber abdominal aorta. Patent portal, splenic, hepatic and renal veins. No  pathologically enlarged lymph nodes in the abdomen or pelvis. Reproductive: Anteverted uterus is minimally enlarged and mildly heterogeneous with scattered internal calcifications, can not exclude small uterine fibroids. No adnexal mass. Other: No pneumoperitoneum, ascites or focal fluid collection. Musculoskeletal: No aggressive appearing focal osseous lesions. IMPRESSION: 1. No findings highly suspicious for metastatic disease. 2. Few scattered tiny pulmonary nodules in both lungs, largest 3 mm, which warrant attention on follow-up chest CT in 3-6 months. 3. Small hypervascular focus in the  superior right liver lobe, nonspecific, favor a benign vascular shunt. Recommend attention on follow-up MRI abdomen without and with IV contrast in 3 months. 4. Small indeterminate hypodense bilateral renal cortical lesions, which can also be further evaluated on MRI abdomen without and with IV contrast in 3 months. 5. Small fluid collection in the ventral left chest wall along the left pectoralis muscle, probably postsurgical. 6. Borderline mild cardiomegaly with borderline prominent main pulmonary artery. 7. 1 vessel coronary atherosclerosis. 8. Moderate diffuse colonic diverticulosis. 9.  Aortic Atherosclerosis (ICD10-I70.0). Electronically Signed   By: Ilona Sorrel M.D.   On: 03/29/2018 16:09     Assessment and plan- Patient is a 68 y.o. female history of stage Ib pathological prognostic stage T2N2A grade 2 invasive mammary carcinoma with mixed ductal and lobular features ER and PR greater than 90% positive and HER-2/neu negative status post left mastectomy and axillary lymph node dissection.  She refused adjuvant chemotherapy and radiation and is currently on Arimidex  I have reviewed her CT chest abdomen and pelvis images independently and discussed findings with the patient.  Scans did not reveal any evidence of metastatic disease.  She does have bilateral subcentimeter lung nodules up to 3 mm too small to  characterize.  I will repeat her CT chest with contrast in about 3 months time.  Bone scan also did not reveal any evidence of bone metastases.  Patient will continue Arimidex along with calcium and vitamin D at this time.  She has a bone density scan scheduled for today  I will see her back in 3 months time with CBC CMP and a CT chest prior   Visit Diagnosis 1. Malignant neoplasm of left breast in female, estrogen receptor positive, unspecified site of breast ([place])   2. Lung nodules   3. Abnormal CT scan, lung      Dr. Randa Evens, MD, MPH [facility] [phone] 04/23/2018 12:06 PM

## 2018-05-10 ENCOUNTER — Inpatient Hospital Stay: Payer: Medicare HMO

## 2018-05-10 ENCOUNTER — Inpatient Hospital Stay (HOSPITAL_BASED_OUTPATIENT_CLINIC_OR_DEPARTMENT_OTHER): Payer: Medicare HMO | Admitting: Genetics

## 2018-05-10 ENCOUNTER — Encounter: Payer: Self-pay | Admitting: Genetics

## 2018-05-10 DIAGNOSIS — Z801 Family history of malignant neoplasm of trachea, bronchus and lung: Secondary | ICD-10-CM

## 2018-05-10 DIAGNOSIS — C50912 Malignant neoplasm of unspecified site of left female breast: Secondary | ICD-10-CM | POA: Diagnosis not present

## 2018-05-10 DIAGNOSIS — Z800 Family history of malignant neoplasm of digestive organs: Secondary | ICD-10-CM | POA: Insufficient documentation

## 2018-05-10 DIAGNOSIS — Z7183 Encounter for nonprocreative genetic counseling: Secondary | ICD-10-CM

## 2018-05-10 DIAGNOSIS — Z803 Family history of malignant neoplasm of breast: Secondary | ICD-10-CM | POA: Diagnosis not present

## 2018-05-10 DIAGNOSIS — Z170 Estrogen receptor positive status [ER+]: Secondary | ICD-10-CM

## 2018-05-10 DIAGNOSIS — C50812 Malignant neoplasm of overlapping sites of left female breast: Secondary | ICD-10-CM

## 2018-05-10 NOTE — Progress Notes (Signed)
REFERRING PROVIDER: Virginia Crews, [address] [place], [place] [address]  PRIMARY PROVIDER:  Virginia Crews, MD  PRIMARY REASON FOR VISIT:  1. Malignant neoplasm of left breast in female, estrogen receptor positive, unspecified site of breast ([place])   2. Family history of breast cancer   3. Family history of throat cancer   4. Family history of lung cancer   5. Malignant neoplasm of overlapping sites of left breast in female, estrogen receptor positive ([place])     HISTORY OF PRESENT ILLNESS:   Meredith Hamilton, a 68 y.o. female, was seen for a [place] cancer genetics consultation at the request of Dr. Brita Romp due to a personal and family history of cancer.  [name] presents to clinic today to discuss the possibility of a hereditary predisposition to cancer, genetic testing, and to further clarify her future cancer risks, as well as potential cancer risks for family members.   In early 2019, at the age of 18, 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  She had a mastectomy nd a sentinel lymph node biopsy in April 2019.    CANCER HISTORY:    Malignant neoplasm of overlapping sites of left breast in female, estrogen receptor positive ([place])   10/27/2017 Initial Diagnosis    Malignant neoplasm of overlapping sites of left breast in female, estrogen receptor positive ([place])    02/26/2018 Cancer Staging    Staging form: Breast, AJCC 8th Edition - Pathologic stage from 02/26/2018: Stage IB (pT2, pN2a, cM0, G2, ER+, PR+, HER2-) - Signed by Sindy Guadeloupe, MD on 02/26/2018    HORMONAL RISK FACTORS:  Menarche was at age 63.  First live birth at age 70.  Ovaries intact: yes. L ovary intact, right ovary removed.  Hysterectomy: no.  Menopausal status: postmenopausal.  HRT use: 0 years. Colonoscopy: yes; reports having 1 polyp, was told to have next colonosocpy in 5 yeras.  Past Medical History:  Diagnosis  Date  . Breast CA ([place])   . Family history of breast cancer   . Family history of lung cancer   . Family history of throat cancer   . Hashimoto's disease   . Hypertension   . Non-celiac gluten sensitivity     Past Surgical History:  Procedure Laterality Date  . APPENDECTOMY  1979  . [place]  . MASTECTOMY W/ SENTINEL NODE BIOPSY Left 2019  . RIGHT OOPHORECTOMY  1979   due to ovarian torsion  . URETEROSCOPY Right 2017    Social History   Socioeconomic History  . Marital status: Divorced    Spouse name: Not on file  . Number of children: 2  . Years of education: 75  . Highest education level: High school graduate  Occupational History  . Occupation: retired Sales promotion account executive for [employer]  . Financial resource strain: Not hard at all  . Food insecurity:    Worry: Never true    Inability: Never true  . Transportation needs:    Medical: No    Non-medical: No  Tobacco Use  . Smoking status: Never Smoker  . Smokeless tobacco: Never Used  Substance and Sexual Activity  . Alcohol use: Not Currently    Frequency: Never  . Drug use: Never  . Sexual activity: Not Currently  Lifestyle  . Physical activity:    Days per week: 5 days    Minutes per session: 60 min  .  Stress: Only a little  Relationships  . Social connections:    Talks on phone: Patient refused    Gets together: Patient refused    Attends religious service: Patient refused    Active member of club or organization: Patient refused    Attends meetings of clubs or organizations: Patient refused    Relationship status: Patient refused  Other Topics Concern  . Not on file  Social History Narrative  . Not on file     FAMILY HISTORY:  We obtained a detailed, 4-generation family history.  Significant diagnoses are listed below: Family History  Problem Relation Age of Onset  . Lung cancer Mother 58       hx smoking  . Lung cancer Father 35       hx smoking  . Breast  cancer Sister 67       in remission, now in mid 19s  . Lung cancer Brother 31       smoker  . Throat cancer Brother        alcohol  . Colon cancer Neg Hx   . Cervical cancer Neg Hx   . Ovarian cancer Neg Hx     [name] has 2 daughters, ages 6 and 40 with no hx of cancer.  She has 34 36 year-old grandson.  [name] has 4 brothers and 3 sisters: -1 sister was dx with breast cancer at age 76.   -69 brother was dx with lung cancer in his 43's.  He had a hx of smokong.  -1 brother was dx with throat cancer, he had a hx of alcohol use. -2 sisters and 2 brothers with no hx of cancer.   [name] father: dx with lung cancer in his early 38's, hx of smoking.  Paternal Aunts/Uncles: 1 paternal uncle and 2 paternal aunts.  1 aunt might have had cancer patient is unsure.  Paternal cousins: no known hx of cancer.  Paternal grandfather: died of age-related disease  Paternal grandmother:died of age-related disease.   [name] mother: lung cancer dx in early 49's, hx of smoking.  Maternal Aunts/Uncles: 1 maternal uncle with no hx of cancer.  Maternal cousins: 2 female cousins and 29 female cousin, no known hx of cancer.  Maternal grandfather: died of age-related disease Maternal grandmother:died of age-related disease.  [name] is unaware of previous family history of genetic testing for hereditary cancer risks. Patient's maternal ancestors are of N. European descent, and paternal ancestors are of N. European/Native American descent. There is no reported Ashkenazi Jewish ancestry. There is no known consanguinity.  GENETIC COUNSELING ASSESSMENT: [name] is a 68 y.o. female with a personal and family history which is somewhat suggestive of a Hereditary Cancer Predisposition Syndrome. We, therefore, discussed and recommended the following at today's visit.   DISCUSSION: We reviewed the characteristics, features and inheritance patterns of hereditary cancer syndromes. We also discussed genetic  testing, including the appropriate family members to test, the process of testing, insurance coverage and turn-around-time for results. We discussed the implications of a negative, positive and/or variant of uncertain significant result. We recommended Ms. Concha Pyo pursue genetic testing for the Multi-Cancer gene panel.   The Multi-Cancer Panel offered by [facility] includes sequencing and/or deletion duplication testing of the following 90 genes: AIP, ALK, APC, ATM, AXIN2, BAP1, BARD1, BLM, BMPR1A, BRCA1, BRCA2, BRIP1, BUB1B, CASR, CDC73, CDH1, CDK4, CDKN1B, CDKN1C, CDKN2A, CEBPA, CHEK2, CTNNA1, DICER1, DIS3L2, EGFR, ENG, EPCAM, FH, FLCN, GALNT12, GATA2, GPC3, GREM1, HOXB13, HRAS, KIT,  MAX, MEN1, MET, MITF, MLH1, MLH3, MSH2, MSH3, MSH6, MUTYH, NBN, NF1, NF2, NTHL1, PALB2, PDGFRA, PHOX2B, PMS2, POLD1, POLE, POT1, PRKAR1A, PTCH1, PTEN, RAD50, RAD51C, RAD51D, RB1, RECQL4, RET, RNF43, RPS20, RUNX1, SDHA, SDHAF2, SDHB, SDHC, SDHD, SMAD4, SMARCA4, SMARCB1, SMARCE1, STK11, SUFU, TERC, TERT, TMEM127, TP53, TSC1, TSC2, VHL, WRN, WT1.  We discussed that only 5-10% of cancers are associated with a Hereditary cancer predisposition syndrome.  One of the most common hereditary cancer syndromes that increases breast cancer risk is called Hereditary Breast and Ovarian Cancer (HBOC) syndrome.  This syndrome is caused by mutations in the BRCA1 and BRCA2 genes.  This syndrome increases an individual's lifetime risk to develop breast, ovarian, pancreatic, and other types of cancer.  There are also many other cancer predisposition syndromes caused by mutations in several other genes.  We also briefly discussed EGFR and hereditary lung cancer as well given family history.   We discussed that if she is found to have a mutation in one of these genes, it may impact future medical management recommendations such as increased cancer screenings and consideration of risk reducing surgeries.  A positive result could also have implications for  the patient's family members.  A Negative result would mean we were unable to identify a hereditary component to her cancer, but does not rule out the possibility of a hereditary basis for her cancer.  There could be mutations that are undetectable by current technology, or in genes not yet tested or identified to increase cancer risk.    We discussed the potential to find a Variant of Uncertain Significance or VUS.  These are variants that have not yet been identified as pathogenic or benign, and it is unknown if this variant is associated with increased cancer risk or if this is a normal finding.  Most VUS's are reclassified to benign or likely benign.   It should not be used to make medical management decisions. With time, we suspect the lab will determine the significance of any VUS's identified if any.   Based on Ms. Pixley's personal and family history of cancer, she meets medical criteria for genetic testing. Despite that she meets criteria, she may still have an out of pocket cost. The laboratory can provide her with an estimate of her OOP cost.  she was given the contact information for the laboratory if she has further questions. Marland Kitchen   PLAN: After considering the risks, benefits, and limitations, Ms. Concha Pyo  provided informed consent to pursue genetic testing and the blood sample was sent to [facility] for analysis of the Multi-Cancer Panel. Results should be available within approximately 2-3 weeks' time, at which point they will be disclosed by telephone to Ms. Concha Pyo, as will any additional recommendations warranted by these results. Ms. Byers will receive a summary of her genetic counseling visit and a copy of her results once available. This information will also be available in Epic. We encouraged Ms. Concha Pyo to remain in contact with cancer genetics annually so that we can continuously update the family history and inform her of any changes in cancer genetics and testing that may be of benefit  for her family. Ms. Fairbairn questions were answered to her satisfaction today. Our contact information was provided should additional questions or concerns arise.  Based on Ms. Steig's family history, we recommended her siste, who was diagnosed with breast cacner at age 37, have genetic counseling and testing. Ms. Zambrana will let us know if we can be of any assistance in coordinating  genetic counseling and/or testing for this family member.   Lastly, we encouraged Ms. Concha Pyo to remain in contact with cancer genetics annually so that we can continuously update the family history and inform her of any changes in cancer genetics and testing that may be of benefit for this family.   Ms.  Rotolo questions were answered to her satisfaction today. Our contact information was provided should additional questions or concerns arise. Thank you for the referral and allowing [place] to share in the care of your patient.   Tana Felts, MS, [facility] Certified Genetic Counselor [email or web address] phone: [phone]  The patient was seen for a total of 30 minutes in face-to-face genetic counseling.  Dr. Grayland Ormond was available for questions regarding this case.

## 2018-05-18 ENCOUNTER — Ambulatory Visit: Payer: Medicare HMO | Admitting: Family Medicine

## 2018-05-21 ENCOUNTER — Telehealth: Payer: Self-pay | Admitting: Genetics

## 2018-05-21 NOTE — Telephone Encounter (Signed)
Revealed negative genetic testing.  Revealed that a VUSs in NBN, RECQL4, and TMEM127 were identified.   This normal result is reassuring and indicates that it is unlikely Meredith Hamilton's cancer is due to a hereditary cause.  It is unlikely that there is an increased risk of another cancer due to a mutation in one of these genes.  However, genetic testing is not perfect, and cannot definitively rule out a hereditary cause.  It will be important for her to keep in contact with genetics to learn if any additional testing may be needed in the future.     We still recommend her sister with breast cancer dx at 45 also consider genetic testing and recommended Meredith Hamilton continue to follow her doctors' recommendations regarding cancer management and screening.

## 2018-05-22 ENCOUNTER — Ambulatory Visit (INDEPENDENT_AMBULATORY_CARE_PROVIDER_SITE_OTHER): Payer: Medicare HMO | Admitting: Family Medicine

## 2018-05-22 ENCOUNTER — Encounter: Payer: Self-pay | Admitting: Family Medicine

## 2018-05-22 VITALS — BP 130/70 | HR 58 | Temp 98.0°F | Wt 178.8 lb

## 2018-05-22 DIAGNOSIS — I10 Essential (primary) hypertension: Secondary | ICD-10-CM | POA: Diagnosis not present

## 2018-05-22 NOTE — Progress Notes (Signed)
Patient: Meredith Hamilton Female    DOB: [birth year]   68 y.o.   MRN: [id number] Visit Date: 05/23/2018  Today's Provider: Lavon Paganini, MD   Chief Complaint  Patient presents with  . Hypertension   Subjective:    I, Tiburcio Pea, CMA, am acting as a Education administrator for Lavon Paganini, MD.   HPI  Hypertension, follow-up:  BP Readings from Last 3 Encounters:  05/22/18 130/70  04/23/18 138/76  02/23/18 (!) 172/88    She was last seen for hypertension 3 months ago.  BP at that visit was 156/77. Management changes since that visit include advised to resume Amlodipine 10 mg. She reports good compliance with treatment. She is not having side effects.  She is exercising. She is adherent to low salt diet.   Outside blood pressures are not being checked at home. She is experiencing none.  Patient denies chest pain, chest pressure/discomfort, claudication, dyspnea, exertional chest pressure/discomfort, fatigue, irregular heart beat, lower extremity edema, near-syncope, orthopnea, palpitations, paroxysmal nocturnal dyspnea, syncope and tachypnea.   Cardiovascular risk factors include advanced age (older than 44 for men, 47 for women) and hypertension.  Use of agents associated with hypertension: none.     Weight trend: stable Wt Readings from Last 3 Encounters:  05/22/18 178 lb 12.8 oz (81.1 kg)  04/23/18 177 lb 8 oz (80.5 kg)  02/23/18 178 lb 11.2 oz (81.1 kg)   Current diet: keto diet  ------------------------------------------------------------------------  Allergies  Allergen Reactions  . Sulfa Antibiotics Swelling  . Tramadol Other (See Comments)    Other reaction(s): Hallucination     Current Outpatient Medications:  .  amLODipine (NORVASC) 10 MG tablet, Take 1 tablet (10 mg total) by mouth daily., Disp: 90 tablet, Rfl: 3 .  anastrozole (ARIMIDEX) 1 MG tablet, Take 1 tablet (1 mg total) by mouth daily., Disp: 30 tablet, Rfl: 6 .  Cholecalciferol (VITAMIN D3)  LIQD, Take 6 drops by mouth daily., Disp: , Rfl:  .  Homeopathic Products (FRANKINCENSE UPLIFTING) OIL, Take 7 drops by mouth daily., Disp: , Rfl:  .  Misc Natural Products (SPLEEN) CAPS, Take 2 capsules by mouth daily., Disp: , Rfl:  .  OVER THE COUNTER MEDICATION, Take 4 Scoops by mouth daily. Pecta Sol-C (Mod. Citrus Pectin), Disp: , Rfl:  .  OVER THE COUNTER MEDICATION, Take 2 Scoops by mouth daily. Fermented Mushroom Blend, Disp: , Rfl:  .  OVER THE COUNTER MEDICATION, Take 6 capsules by mouth daily. Cruciferous Complete, Disp: , Rfl:  .  OVER THE COUNTER MEDICATION, Nitric Balance 4 tsp daily, Disp: , Rfl:  .  Specialty Vitamins Products (MAMMARY) TABS, Take 2 tablets by mouth daily., Disp: , Rfl:  .  TURMERIC PO, Take 7 drops by mouth daily., Disp: , Rfl:  .  UNABLE TO FIND, Take 6 tablets by mouth daily. Med Name: Vitamin B 17, Disp: , Rfl:  .  UNABLE TO FIND, Take 10 drops by mouth daily. Med Name: Liquid Vitamin A, Disp: , Rfl:  .  UNABLE TO FIND, Med Name: Hydrex 2 vials daily, Disp: , Rfl:  .  UNABLE TO FIND, Med Name: Proglyco-SP 3 every other day, Disp: , Rfl:  .  UNABLE TO FIND, Med Name: Hemp Oil Complete 2 daily, Disp: , Rfl:  .  UNABLE TO FIND, Med Name: Artemisinin Complex 2 daily, Disp: , Rfl:  .  UNABLE TO FIND, Med Name: Adrena Calm 1/4 pump daily, Disp: , Rfl:  .  UNABLE  TO FIND, Med Name: Immuno Plus 3 drops daily, Disp: , Rfl:  .  UNABLE TO FIND, Med Name: Rockne Menghini Glutathione 5 tsp daily, Disp: , Rfl:  .  UNABLE TO FIND, Med Name: Vitamin C Drip 20,000 once weekly, Disp: , Rfl:  .  UNABLE TO FIND, Med Name: Myrrh 3 drops daily, Disp: , Rfl:  .  UNABLE TO FIND, Med Name: Welford Roche Vite 6 per day, Disp: , Rfl:  .  UNABLE TO FIND, Med Name: Pea Protein Isolate 1 scoop per day, Disp: , Rfl:  .  UNABLE TO FIND, Med Name: Alen Blew 1 scoop per day, Disp: , Rfl:   Review of Systems  Constitutional: Negative.   Respiratory: Negative.   Cardiovascular: Negative.     Musculoskeletal: Negative.     Social History   Tobacco Use  . Smoking status: Never Smoker  . Smokeless tobacco: Never Used  Substance Use Topics  . Alcohol use: Not Currently    Frequency: Never      Objective:   BP 130/70   Pulse (!) 58   Temp 98 F (36.7 C) (Oral)   Wt 178 lb 12.8 oz (81.1 kg)   SpO2 97%   BMI 28.86 kg/m  Vitals:   05/22/18 1141 05/22/18 1205  BP: (!) 145/80 130/70  Pulse: (!) 58   Temp: 98 F (36.7 C)   TempSrc: Oral   SpO2: 97%   Weight: 178 lb 12.8 oz (81.1 kg)      Physical Exam Vitals signs reviewed.  Constitutional:      General: She is not in acute distress.    Appearance: Normal appearance. She is not diaphoretic.  HENT:     Head: Normocephalic and atraumatic.  Eyes:     General: No scleral icterus.    Conjunctiva/sclera: Conjunctivae normal.  Neck:     Musculoskeletal: Neck supple.  Cardiovascular:     Rate and Rhythm: Normal rate and regular rhythm.     Pulses: Normal pulses.     Heart sounds: Normal heart sounds. No murmur.  Pulmonary:     Effort: Pulmonary effort is normal. No respiratory distress.     Breath sounds: Normal breath sounds. No wheezing or rhonchi.  Musculoskeletal:     Right lower leg: No edema.     Left lower leg: No edema.  Lymphadenopathy:     Cervical: No cervical adenopathy.  Skin:    General: Skin is warm and dry.     Capillary Refill: Capillary refill takes less than 2 seconds.     Findings: No rash.  Neurological:     Mental Status: She is alert and oriented to person, place, and time. Mental status is at baseline.  Psychiatric:        Mood and Affect: Mood normal.        Behavior: Behavior normal.         Assessment & Plan   Problem List Items Addressed This Visit      Cardiovascular and Mediastinum   Essential (primary) hypertension - Primary    Well controlled on manual recheck Continue current medications Reviewed recent metabolic panel          Return in about 3  months (around 08/20/2018).   The entirety of the information documented in the History of Present Illness, Review of Systems and Physical Exam were personally obtained by me. Portions of this information were initially documented by Tiburcio Pea, CMA and reviewed by me for thoroughness and accuracy.  Virginia Crews, MD, MPH [facility] 05/23/2018 10:46 AM

## 2018-05-22 NOTE — Patient Instructions (Signed)

## 2018-05-23 NOTE — Assessment & Plan Note (Signed)
Well controlled on manual recheck Continue current medications Reviewed recent metabolic panel

## 2018-05-24 ENCOUNTER — Ambulatory Visit: Payer: Self-pay | Admitting: Genetics

## 2018-05-24 ENCOUNTER — Encounter: Payer: Self-pay | Admitting: Genetics

## 2018-05-24 DIAGNOSIS — Z1379 Encounter for other screening for genetic and chromosomal anomalies: Secondary | ICD-10-CM

## 2018-05-24 DIAGNOSIS — Z801 Family history of malignant neoplasm of trachea, bronchus and lung: Secondary | ICD-10-CM

## 2018-05-24 DIAGNOSIS — Z0000 Encounter for general adult medical examination without abnormal findings: Secondary | ICD-10-CM | POA: Insufficient documentation

## 2018-05-24 DIAGNOSIS — C50812 Malignant neoplasm of overlapping sites of left female breast: Secondary | ICD-10-CM

## 2018-05-24 DIAGNOSIS — Z170 Estrogen receptor positive status [ER+]: Secondary | ICD-10-CM

## 2018-05-24 DIAGNOSIS — Z800 Family history of malignant neoplasm of digestive organs: Secondary | ICD-10-CM

## 2018-05-24 DIAGNOSIS — Z803 Family history of malignant neoplasm of breast: Secondary | ICD-10-CM

## 2018-05-24 DIAGNOSIS — C50912 Malignant neoplasm of unspecified site of left female breast: Secondary | ICD-10-CM

## 2018-05-24 NOTE — Progress Notes (Signed)
HPI:  Ms. Caudle was previously seen in the [facility] on 05/10/2018 due to a personal and family history of cancer and concerns regarding a hereditary predisposition to cancer. Please refer to our prior cancer genetics clinic note for more information regarding Ms. Borba's medical, social and family histories, and our assessment and recommendations, at the time. Ms. Rottman recent genetic test results were disclosed to her, as well as recommendations warranted by these results. These results and recommendations are discussed in more detail below.  CANCER HISTORY:    Malignant neoplasm of overlapping sites of left breast in female, estrogen receptor positive ([place])   10/27/2017 Initial Diagnosis    Malignant neoplasm of overlapping sites of left breast in female, estrogen receptor positive ([place])    02/26/2018 Cancer Staging    Staging form: Breast, AJCC 8th Edition - Pathologic stage from 02/26/2018: Stage IB (pT2, pN2a, cM0, G2, ER+, PR+, HER2-) - Signed by Sindy Guadeloupe, MD on 02/26/2018      FAMILY HISTORY:  We obtained a detailed, 4-generation family history.  Significant diagnoses are listed below: Family History  Problem Relation Age of Onset  . Lung cancer Mother 3       hx smoking  . Lung cancer Father 4       hx smoking  . Breast cancer Sister 29       in remission, now in mid 78s  . Lung cancer Brother 64       smoker  . Throat cancer Brother        alcohol  . Colon cancer Neg Hx   . Cervical cancer Neg Hx   . Ovarian cancer Neg Hx     Ms. Andre has 2 daughters, ages 32 and 19 with no hx of cancer.  She has 15 29 year-old grandson.  Ms. Ewton has 4 brothers and 3 sisters: -1 sister was dx with breast cancer at age 96.   -41 brother was dx with lung cancer in his 64's.  He had a hx of smokong.  -1 brother was dx with throat cancer, he had a hx of alcohol use. -2 sisters and 2 brothers with no hx of cancer.   Ms. Nedd father: dx with lung cancer in  his early 71's, hx of smoking.  Paternal Aunts/Uncles: 1 paternal uncle and 2 paternal aunts.  1 aunt might have had cancer patient is unsure.  Paternal cousins: no known hx of cancer.  Paternal grandfather: died of age-related disease  Paternal grandmother:died of age-related disease.   Ms. Streater mother: lung cancer dx in early 5's, hx of smoking.  Maternal Aunts/Uncles: 1 maternal uncle with no hx of cancer.  Maternal cousins: 2 female cousins and 1 female cousin, no known hx of cancer.  Maternal grandfather: died of age-related disease Maternal grandmother:died of age-related disease.  Ms. App is unaware of previous family history of genetic testing for hereditary cancer risks. Patient's maternal ancestors are of N. European descent, and paternal ancestors are of N. European/Native American descent. There is no reported Ashkenazi Jewish ancestry. There is no known consanguinity.  GENETIC TEST RESULTS: Genetic testing performed through [facility] reported out on 05/17/2018 showed no pathogenic mutations.   The Multi-Cancer Panel offered by [facility] includes sequencing and/or deletion duplication testing of the following 90 genes: AIP, ALK, APC, ATM, AXIN2, BAP1, BARD1, BLM, BMPR1A, BRCA1, BRCA2, BRIP1, BUB1B, CASR, CDC73, CDH1, CDK4, CDKN1B, CDKN1C, CDKN2A, CEBPA, CHEK2, CTNNA1, DICER1, DIS3L2, EGFR, ENG, EPCAM, FH,  FLCN, GALNT12, GATA2, GPC3, GREM1, HOXB13, HRAS, KIT, MAX, MEN1, MET, MITF, MLH1, MLH3, MSH2, MSH3, MSH6, MUTYH, NBN, NF1, NF2, NTHL1, PALB2, PDGFRA, PHOX2B, PMS2, POLD1, POLE, POT1, PRKAR1A, PTCH1, PTEN, RAD50, RAD51C, RAD51D, RB1, RECQL4, RET, RNF43, RPS20, RUNX1, SDHA, SDHAF2, SDHB, SDHC, SDHD, SMAD4, SMARCA4, SMARCB1, SMARCE1, STK11, SUFU, TERC, TERT, TMEM127, TP53, TSC1, TSC2, VHL, WRN, WT1.  A variant of uncertain significance (VUS) in a gene called NBN was also noted. c.1404G>T (p.Arg468Ser) A variant of uncertain significance (VUS) in a gene called RECQL4  was also noted. c.2404G>A (p.Val802Met) A variant of uncertain significance (VUS) in a gene called TMEM127 was also noted. c.292G>C (p.Ala98Pro)  The test report will be scanned into EPIC and will be located under the Molecular Pathology section of the Results Review tab. A portion of the result report is included below for reference.     We discussed with Ms. Thew that because current genetic testing is not perfect, it is possible there may be a gene mutation in one of these genes that current testing cannot detect, but that chance is small.  We also discussed, that there could be another gene that has not yet been discovered, or that we have not yet tested, that is responsible for the cancer diagnoses in the family. It is also possible there is a hereditary cause for the cancer in the family that Ms. Sieben did not inherit and therefore was not identified in her testing.  Therefore, it is important to remain in touch with cancer genetics in the future so that we can continue to offer Ms. Esty the most up to date genetic testing.   Regarding the VUS's in [place], RECQL4, TMEM127: At this time, it is unknown if these variants are associated with increased cancer risk or if they are normal findings, but most variants such as these get reclassified to being inconsequential. They should not be used to make medical management decisions. With time, we suspect the lab will determine the significance of these variants, if any. If we do learn more about them, we will try to contact Ms. Concha Pyo to discuss it further. However, it is important to stay in touch with [place] periodically and keep the address and phone number up to date.  ADDITIONAL GENETIC TESTING: We discussed with Ms. Malenfant that her genetic testing was fairly extensive.  If there are are genes identified to increase cancer risk that can be analyzed in the future, we would be happy to discuss and coordinate this testing at that time.    CANCER SCREENING  RECOMMENDATIONS: Ms. Rosander test result is considered negative (normal).  This means that we have not identified a hereditary cause for her personal and family history of cancer at this time.   While reassuring, this does not definitively rule out a hereditary predisposition to cancer. It is still possible that there could be genetic mutations that are undetectable by current technology, or genetic mutations in genes that have not been tested or identified to increase cancer risk.  Therefore, it is recommended she continue to follow the cancer management and screening guidelines provided by her oncology and primary healthcare provider. An individual's cancer risk is not determined by genetic test results alone.  Overall cancer risk assessment includes additional factors such as personal medical history, family history, etc.  These should be used to make a personalized plan for cancer prevention and surveillance.    RECOMMENDATIONS FOR FAMILY MEMBERS:  Relatives in this family might be at some increased risk of  developing cancer, over the general population risk, simply due to the family history of cancer.  We recommended women in this family have a yearly mammogram beginning at age 19, or 14 years younger than the earliest onset of cancer, an annual clinical breast exam, and perform monthly breast self-exams. Women in this family should also have a gynecological exam as recommended by their primary provider. All family members should have a colonoscopy by age 53 (or as directed by their doctors).  All family members should inform their physicians about the family history of cancer so their doctors can make the most appropriate screening recommendations for them.   It is also possible there is a hereditary cause for the cancer in Ms. Tschida's family that she did not inherit and therefore was not identified in her.   Therefore, we recommended her sister with breast cancer dx at 68 have genetic counseling and  testing. Ms. Huston will let us know if we can be of any assistance in coordinating genetic counseling and/or testing for these family members.   FOLLOW-UP: Lastly, we discussed with Ms. Karren that cancer genetics is a rapidly advancing field and it is possible that new genetic tests will be appropriate for her and/or her family members in the future. We encouraged her to remain in contact with cancer genetics on an annual basis so we can update her personal and family histories and let her know of advances in cancer genetics that may benefit this family.   Our contact number was provided. Ms. Kube questions were answered to her satisfaction, and she knows she is welcome to call us at anytime with additional questions or concerns.   Ferol Luz, MS, [facility] Certified Genetic Counselor [email or web address]

## 2018-06-01 LAB — HM MAMMOGRAPHY

## 2018-07-18 ENCOUNTER — Other Ambulatory Visit: Payer: Self-pay

## 2018-07-18 ENCOUNTER — Ambulatory Visit
Admission: RE | Admit: 2018-07-18 | Discharge: 2018-07-18 | Disposition: A | Discharge status: Home / Self Care | Payer: Medicare HMO | Source: Ambulatory Visit | Attending: Oncology | Admitting: Oncology

## 2018-07-18 DIAGNOSIS — Z170 Estrogen receptor positive status [ER+]: Secondary | ICD-10-CM | POA: Diagnosis present

## 2018-07-18 DIAGNOSIS — C50912 Malignant neoplasm of unspecified site of left female breast: Secondary | ICD-10-CM | POA: Diagnosis not present

## 2018-07-18 DIAGNOSIS — R918 Other nonspecific abnormal finding of lung field: Secondary | ICD-10-CM | POA: Diagnosis present

## 2018-07-18 LAB — POCT I-STAT CREATININE: Creatinine, Ser: 0.6 mg/dL (ref 0.44–1.00)

## 2018-07-18 MED ORDER — IOHEXOL 300 MG/ML  SOLN
75.0000 mL | Freq: Once | INTRAMUSCULAR | Status: AC | PRN
  Administered 2018-07-18: 09:00:00 75 mL via INTRAVENOUS

## 2018-07-20 ENCOUNTER — Ambulatory Visit: Admission: RE | Admit: 2018-07-20 | Payer: Medicare HMO | Source: Ambulatory Visit

## 2018-07-23 ENCOUNTER — Telehealth: Payer: Self-pay | Admitting: *Deleted

## 2018-07-23 ENCOUNTER — Ambulatory Visit: Payer: Medicare HMO | Admitting: Oncology

## 2018-07-23 ENCOUNTER — Other Ambulatory Visit: Payer: Medicare HMO

## 2018-07-23 NOTE — Telephone Encounter (Signed)
Pt showed up at cancer center but her appt had been r/s due to coronavirus epidemic.  I apologized for her coming all the way over and the visit was rescheduled. She states she probably got message but forgot. I told her that the pulmonary nodules on scan were stable-no change in the results since the prior Ct scan chest.  Dr. Janese Banks wants her to f/u on June 9. She would like the schedule to be mailed to her and I have sent a message to Lorena to print if off and send it to her. Patient was happy to get the good news about pulmonary nodules

## 2018-08-23 ENCOUNTER — Ambulatory Visit: Payer: Medicare HMO | Admitting: Family Medicine

## 2018-09-18 ENCOUNTER — Other Ambulatory Visit: Payer: Medicare HMO

## 2018-09-18 ENCOUNTER — Ambulatory Visit: Payer: Medicare HMO | Admitting: Oncology

## 2018-10-23 ENCOUNTER — Other Ambulatory Visit: Payer: Self-pay | Admitting: *Deleted

## 2018-10-23 DIAGNOSIS — C50912 Malignant neoplasm of unspecified site of left female breast: Secondary | ICD-10-CM

## 2018-10-23 DIAGNOSIS — R918 Other nonspecific abnormal finding of lung field: Secondary | ICD-10-CM

## 2018-10-25 ENCOUNTER — Inpatient Hospital Stay: Payer: Medicare HMO | Attending: Oncology

## 2018-10-25 ENCOUNTER — Inpatient Hospital Stay (HOSPITAL_BASED_OUTPATIENT_CLINIC_OR_DEPARTMENT_OTHER): Payer: Medicare HMO | Admitting: Oncology

## 2018-10-25 ENCOUNTER — Other Ambulatory Visit: Payer: Self-pay

## 2018-10-25 ENCOUNTER — Encounter: Payer: Self-pay | Admitting: Oncology

## 2018-10-25 VITALS — BP 156/78 | HR 64 | Temp 98.5°F | Resp 18 | Wt 184.4 lb

## 2018-10-25 DIAGNOSIS — Z888 Allergy status to other drugs, medicaments and biological substances status: Secondary | ICD-10-CM | POA: Insufficient documentation

## 2018-10-25 DIAGNOSIS — Z79899 Other long term (current) drug therapy: Secondary | ICD-10-CM | POA: Diagnosis not present

## 2018-10-25 DIAGNOSIS — Z885 Allergy status to narcotic agent status: Secondary | ICD-10-CM

## 2018-10-25 DIAGNOSIS — R918 Other nonspecific abnormal finding of lung field: Secondary | ICD-10-CM | POA: Diagnosis not present

## 2018-10-25 DIAGNOSIS — Z801 Family history of malignant neoplasm of trachea, bronchus and lung: Secondary | ICD-10-CM

## 2018-10-25 DIAGNOSIS — Z882 Allergy status to sulfonamides status: Secondary | ICD-10-CM | POA: Insufficient documentation

## 2018-10-25 DIAGNOSIS — Z79811 Long term (current) use of aromatase inhibitors: Secondary | ICD-10-CM

## 2018-10-25 DIAGNOSIS — Z170 Estrogen receptor positive status [ER+]: Secondary | ICD-10-CM

## 2018-10-25 DIAGNOSIS — Z853 Personal history of malignant neoplasm of breast: Secondary | ICD-10-CM

## 2018-10-25 DIAGNOSIS — C50912 Malignant neoplasm of unspecified site of left female breast: Secondary | ICD-10-CM

## 2018-10-25 DIAGNOSIS — Z808 Family history of malignant neoplasm of other organs or systems: Secondary | ICD-10-CM | POA: Diagnosis not present

## 2018-10-25 DIAGNOSIS — I10 Essential (primary) hypertension: Secondary | ICD-10-CM

## 2018-10-25 DIAGNOSIS — C50812 Malignant neoplasm of overlapping sites of left female breast: Secondary | ICD-10-CM

## 2018-10-25 DIAGNOSIS — Z08 Encounter for follow-up examination after completed treatment for malignant neoplasm: Secondary | ICD-10-CM

## 2018-10-25 DIAGNOSIS — Z803 Family history of malignant neoplasm of breast: Secondary | ICD-10-CM | POA: Diagnosis not present

## 2018-10-25 DIAGNOSIS — Z5181 Encounter for therapeutic drug level monitoring: Secondary | ICD-10-CM

## 2018-10-25 LAB — CBC WITH DIFFERENTIAL/PLATELET
Abs Immature Granulocytes: 0.01 10*3/uL (ref 0.00–0.07)
Basophils Absolute: 0 10*3/uL (ref 0.0–0.1)
Basophils Relative: 1 %
Eosinophils Absolute: 0.1 10*3/uL (ref 0.0–0.5)
Eosinophils Relative: 2 %
HCT: 37.6 % (ref 36.0–46.0)
Hemoglobin: 12.3 g/dL (ref 12.0–15.0)
Immature Granulocytes: 0 %
Lymphocytes Relative: 44 %
Lymphs Abs: 1.9 10*3/uL (ref 0.7–4.0)
MCH: 31.5 pg (ref 26.0–34.0)
MCHC: 32.7 g/dL (ref 30.0–36.0)
MCV: 96.2 fL (ref 80.0–100.0)
Monocytes Absolute: 0.4 10*3/uL (ref 0.1–1.0)
Monocytes Relative: 9 %
Neutro Abs: 1.9 10*3/uL (ref 1.7–7.7)
Neutrophils Relative %: 44 %
Platelets: 131 10*3/uL — ABNORMAL LOW (ref 150–400)
RBC: 3.91 MIL/uL (ref 3.87–5.11)
RDW: 12.7 % (ref 11.5–15.5)
WBC: 4.3 10*3/uL (ref 4.0–10.5)
nRBC: 0 % (ref 0.0–0.2)

## 2018-10-25 LAB — COMPREHENSIVE METABOLIC PANEL
ALT: 17 U/L (ref 0–44)
AST: 23 U/L (ref 15–41)
Albumin: 4.3 g/dL (ref 3.5–5.0)
Alkaline Phosphatase: 67 U/L (ref 38–126)
Anion gap: 8 (ref 5–15)
BUN: 14 mg/dL (ref 8–23)
CO2: 26 mmol/L (ref 22–32)
Calcium: 9.5 mg/dL (ref 8.9–10.3)
Chloride: 108 mmol/L (ref 98–111)
Creatinine, Ser: 0.67 mg/dL (ref 0.44–1.00)
GFR calc Af Amer: >60 mL/min (ref >60)
GFR calc non Af Amer: >60 mL/min (ref >60)
Glucose, Bld: 91 mg/dL (ref 70–99)
Potassium: 3.8 mmol/L (ref 3.5–5.1)
Sodium: 142 mmol/L (ref 135–145)
Total Bilirubin: 0.9 mg/dL (ref 0.3–1.2)
Total Protein: 7.4 g/dL (ref 6.5–8.1)

## 2018-10-25 MED ORDER — ANASTROZOLE 1 MG PO TABS: 1.0000 mg | ORAL_TABLET | Freq: Every day | ORAL | 3 refills | Status: DC

## 2018-10-25 NOTE — Progress Notes (Signed)
Patient had chest CT performed on 07/18/2018.  Patient does not offer any problems today.

## 2018-10-25 NOTE — Addendum Note (Signed)
Addended by: Luella Cook on: 10/25/2018 05:01 PM   Modules accepted: Orders

## 2018-10-25 NOTE — Progress Notes (Signed)
Hematology/Oncology Consult note [facility]  [phone] Fax:([phone]  Patient Care Team: Virginia Crews, MD as PCP - General (Family Medicine) Johny Blamer, [place] (Chiropractic Medicine) Debbora Dus, NP (Nurse Practitioner)   Name of the patient: Meredith Hamilton  [id number]  07-27-1950   Date of visit: 10/25/18  Diagnosis- h/o left breast cancer pathological prognostic stage IB T2N2M0 ER PR positive her 2 negative s/p mastectomy and axillary LN dissection   Chief complaint/ Reason for visit-routine follow-up of breast cancer  Heme/Onc history: Patient is a 68 year old female who was found to have an abnormal screening mammogram at an outside facility in [place] in February 2019. This was followed by a mastectomy and a sentinel lymph node biopsy in April 2019. Intraoperative frozen section of the lymph node was positive and this was converted to an axillary lymph node dissection. Final pathology revealed mixed pathology of invasive mammary as well as invasive lobular carcinoma. There were 2 foci measuring 2.1 and 0.8 cm. Overall grade 2. ER PR strongly positive greater than 90% and HER-2 was negative. 5 out of 15 axillary lymph nodes were positive for metastatic disease with the largest deposit being 1.8 cm and extranodal extension was present. Perineural invasion was identified. No LV I. Final margins were clear pathological stage T2N2A. She was recommended adjuvant chemotherapy and postmastectomy radiation. However patient did not want to go through it and eventually moved her care to [facility]. She has also been seen by surgical oncology in [facility] recently in July 2019. She has been pursuing alternative therapy with vitamin C infusions as well as other nutritional supplements.   Patient was started on Arimidex in November 2019   Interval history-overall she is doing well and tolerating Arimidex without any  significant side effects.  She is also taking calcium and vitamin D  ECOG PS- 0 Pain scale- 0   Review of systems- Review of Systems  Constitutional: Negative for chills, fever, malaise/fatigue and weight loss.  HENT: Negative for congestion, ear discharge and nosebleeds.   Eyes: Negative for blurred vision.  Respiratory: Negative for cough, hemoptysis, sputum production, shortness of breath and wheezing.   Cardiovascular: Negative for chest pain, palpitations, orthopnea and claudication.  Gastrointestinal: Negative for abdominal pain, blood in stool, constipation, diarrhea, heartburn, melena, nausea and vomiting.  Genitourinary: Negative for dysuria, flank pain, frequency, hematuria and urgency.  Musculoskeletal: Negative for back pain, joint pain and myalgias.  Skin: Negative for rash.  Neurological: Negative for dizziness, tingling, focal weakness, seizures, weakness and headaches.  Endo/Heme/Allergies: Does not bruise/bleed easily.  Psychiatric/Behavioral: Negative for depression and suicidal ideas. The patient does not have insomnia.        Allergies  Allergen Reactions  . Sulfa Antibiotics Swelling  . Tramadol Other (See Comments)    Other reaction(s): Hallucination     Past Medical History:  Diagnosis Date  . Breast CA ([place])   . Family history of breast cancer   . Family history of lung cancer   . Family history of throat cancer   . Hashimoto's disease   . Hypertension   . Non-celiac gluten sensitivity      Past Surgical History:  Procedure Laterality Date  . APPENDECTOMY  1979  . Parchment  . MASTECTOMY W/ SENTINEL NODE BIOPSY Left 2019  . RIGHT OOPHORECTOMY  1979   due to ovarian torsion  . URETEROSCOPY Right 2017    Social History   Socioeconomic History  .  Marital status: Divorced    Spouse name: Not on file  . Number of children: 2  . Years of education: 50  . Highest education level: High school graduate  Occupational History   . Occupation: retired Sales promotion account executive for [employer]  . Financial resource strain: Not hard at all  . Food insecurity    Worry: Never true    Inability: Never true  . Transportation needs    Medical: No    Non-medical: No  Tobacco Use  . Smoking status: Never Smoker  . Smokeless tobacco: Never Used  Substance and Sexual Activity  . Alcohol use: Not Currently    Frequency: Never  . Drug use: Never  . Sexual activity: Not Currently  Lifestyle  . Physical activity    Days per week: 5 days    Minutes per session: 60 min  . Stress: Only a little  Relationships  . Social Herbalist on phone: Patient refused    Gets together: Patient refused    Attends religious service: Patient refused    Active member of club or organization: Patient refused    Attends meetings of clubs or organizations: Patient refused    Relationship status: Patient refused  . Intimate partner violence    Fear of current or ex partner: Patient refused    Emotionally abused: Patient refused    Physically abused: Patient refused    Forced sexual activity: Patient refused  Other Topics Concern  . Not on file  Social History Narrative  . Not on file    Family History  Problem Relation Age of Onset  . Lung cancer Mother 14       hx smoking  . Lung cancer Father 6       hx smoking  . Breast cancer Sister 55       in remission, now in mid 98s  . Lung cancer Brother 60       smoker  . Throat cancer Brother        alcohol  . Colon cancer Neg Hx   . Cervical cancer Neg Hx   . Ovarian cancer Neg Hx      Current Outpatient Medications:  .  amLODipine (NORVASC) 10 MG tablet, Take 1 tablet (10 mg total) by mouth daily., Disp: 90 tablet, Rfl: 3 .  anastrozole (ARIMIDEX) 1 MG tablet, Take 1 tablet (1 mg total) by mouth daily., Disp: 30 tablet, Rfl: 6 .  Cholecalciferol (VITAMIN D3) LIQD, Take 6 drops by mouth daily., Disp: , Rfl:  .  Homeopathic Products (FRANKINCENSE  UPLIFTING) OIL, Take 7 drops by mouth daily., Disp: , Rfl:  .  Misc Natural Products (SPLEEN) CAPS, Take 2 capsules by mouth daily., Disp: , Rfl:  .  OVER THE COUNTER MEDICATION, Take 4 Scoops by mouth daily. Pecta Sol-C (Mod. Citrus Pectin), Disp: , Rfl:  .  OVER THE COUNTER MEDICATION, Take 2 Scoops by mouth daily. Fermented Mushroom Blend, Disp: , Rfl:  .  OVER THE COUNTER MEDICATION, Take 6 capsules by mouth daily. Cruciferous Complete, Disp: , Rfl:  .  OVER THE COUNTER MEDICATION, Nitric Balance 4 tsp daily, Disp: , Rfl:  .  Specialty Vitamins Products (MAMMARY) TABS, Take 2 tablets by mouth daily., Disp: , Rfl:  .  TURMERIC PO, Take 7 drops by mouth daily., Disp: , Rfl:  .  UNABLE TO FIND, Take 6 tablets by mouth daily. Med Name: Vitamin B 17, Disp: , Rfl:  .  UNABLE TO FIND, Take 10 drops by mouth daily. Med Name: Liquid Vitamin A, Disp: , Rfl:  .  UNABLE TO FIND, Med Name: Hydrex 2 vials daily, Disp: , Rfl:  .  UNABLE TO FIND, Med Name: Proglyco-SP 3 every other day, Disp: , Rfl:  .  UNABLE TO FIND, Med Name: Hemp Oil Complete 2 daily, Disp: , Rfl:  .  UNABLE TO FIND, Med Name: Artemisinin Complex 2 daily, Disp: , Rfl:  .  UNABLE TO FIND, Med Name: Adrena Calm 1/4 pump daily, Disp: , Rfl:  .  UNABLE TO FIND, Med Name: Immuno Plus 3 drops daily, Disp: , Rfl:  .  UNABLE TO FIND, Med Name: Rockne Menghini Glutathione 5 tsp daily, Disp: , Rfl:  .  UNABLE TO FIND, Med Name: Vitamin C Drip 20,000 once weekly, Disp: , Rfl:  .  UNABLE TO FIND, Med Name: Myrrh 3 drops daily, Disp: , Rfl:  .  UNABLE TO FIND, Med Name: Welford Roche Vite 6 per day, Disp: , Rfl:  .  UNABLE TO FIND, Med Name: Pea Protein Isolate 1 scoop per day, Disp: , Rfl:  .  UNABLE TO FIND, Med Name: Alen Blew 1 scoop per day, Disp: , Rfl:   Physical exam:  Vitals:   10/25/18 1116  BP: (!) 156/78  Pulse: 64  Resp: 18  Temp: 98.5 F (36.9 C)  Weight: 184 lb 6.4 oz (83.6 kg)   Physical Exam HENT:     Head: Normocephalic and  atraumatic.  Eyes:     Pupils: Pupils are equal, round, and reactive to light.  Neck:     Musculoskeletal: Normal range of motion.  Cardiovascular:     Rate and Rhythm: Normal rate and regular rhythm.     Heart sounds: Normal heart sounds.  Pulmonary:     Effort: Pulmonary effort is normal.     Breath sounds: Normal breath sounds.  Abdominal:     General: Bowel sounds are normal.     Palpations: Abdomen is soft.  Skin:    General: Skin is warm and dry.  Neurological:     Mental Status: She is alert and oriented to person, place, and time.    Breast exam is performed in seated and lying down position. Patient is status post left mastectomy with reconstruction. The implant edges are intact and there is no evidence of any chest wall recurrence. No evidence of bilateral axillary adenopathy.  No palpable masses or lesions in the right breast   CMP Latest Ref Rng & Units 10/25/2018  Glucose 70 - 99 mg/dL 91  BUN 8 - 23 mg/dL 14  Creatinine 0.44 - 1.00 mg/dL 0.67  Sodium 135 - 145 mmol/L 142  Potassium 3.5 - 5.1 mmol/L 3.8  Chloride 98 - 111 mmol/L 108  CO2 22 - 32 mmol/L 26  Calcium 8.9 - 10.3 mg/dL 9.5  Total Protein 6.5 - 8.1 g/dL 7.4  Total Bilirubin 0.3 - 1.2 mg/dL 0.9  Alkaline Phos 38 - 126 U/L 67  AST 15 - 41 U/L 23  ALT 0 - 44 U/L 17   CBC Latest Ref Rng & Units 10/25/2018  WBC 4.0 - 10.5 K/uL 4.3  Hemoglobin 12.0 - 15.0 g/dL 12.3  Hematocrit 36.0 - 46.0 % 37.6  Platelets 150 - 400 K/uL 131(L)      Assessment and plan- Patient is a 68 y.o. female history of stage Ib pathological prognostic stage T2N2A grade 2 invasive mammary carcinoma with mixed ductal and lobular features  ER and PR greater than 90% positive and HER-2/neu negative status post left mastectomy and axillary lymph node dissection.  She refused adjuvant chemotherapy and radiation and is currently on Arimidex.  This is a routine follow-up visit for breast cancer  Patient follows up with surgical oncology at  [facility] and underwent a right breast mammogram in February 2020 which did not show any evidence of malignancy.  Clinically she is doing well and there is no concern for recurrence based on signs and symptoms on today's exam.  I will see her back in 6 months.  No labs  Patient noted to have small subcentimeter lung nodules which remain stable on a repeat CT chest in April 2020 likely pointing out to a benign etiology.  I will plan to get repeat CT chest without contrast in April 2021    Visit Diagnosis 1. Encounter for follow-up surveillance of breast cancer   2. Visit for monitoring Arimidex therapy      Dr. Randa Evens, MD, MPH [facility] [phone] 10/25/2018 11:07 AM

## 2018-12-27 ENCOUNTER — Other Ambulatory Visit: Payer: Self-pay | Admitting: Oncology

## 2019-01-28 ENCOUNTER — Ambulatory Visit: Payer: Medicare HMO

## 2019-02-14 NOTE — Progress Notes (Signed)
Subjective:   Meredith Hamilton is a 68 y.o. female who presents for Medicare Annual (Subsequent) preventive examination.  Review of Systems:  N/A  Cardiac Risk Factors include: advanced age (>59men, >59 women);hypertension     Objective:     Vitals: BP (!) 160/76 (BP Location: Right Arm)    Pulse 64    Temp 98.4 F (36.9 C) (Oral)    Ht 5\' 6"  (1.676 m)    Wt 193 lb 3.2 oz (87.6 kg)    BMI 31.18 kg/m   Body mass index is 31.18 kg/m.  Advanced Directives 02/18/2019 04/23/2018 02/23/2018 02/23/2018 01/25/2018  Does Patient Have a Medical Advance Directive? No No No No No  Would patient like information on creating a medical advance directive? No - Patient declined No - Patient declined No - Patient declined No - Patient declined No - Patient declined    Tobacco Social History   Tobacco Use  Smoking Status Never Smoker  Smokeless Tobacco Never Used     Counseling given: Not Answered   Clinical Intake:  Pre-visit preparation completed: Yes  Pain : 0-10 Pain Score: 5  Pain Type: Acute pain Pain Location: Wrist Pain Orientation: Left Pain Descriptors / Indicators: Shooting Pain Frequency: Constant     Nutritional Status: BMI > 30  Obese Nutritional Risks: None Diabetes: No  How often do you need to have someone help you when you read instructions, pamphlets, or other written materials from your doctor or pharmacy?: 1 - Never  Interpreter Needed?: No  Information entered by :: Mmarkoski, LPN  Past Medical History:  Diagnosis Date   Breast CA ([place])    Family history of breast cancer    Family history of lung cancer    Family history of throat cancer    Hashimoto's disease    Hypertension    Non-celiac gluten sensitivity    Past Surgical History:  Procedure Laterality Date   APPENDECTOMY  1979   LUMBAR [place] W/ SENTINEL NODE BIOPSY Left 2019   RIGHT OOPHORECTOMY  1979   due to ovarian torsion   URETEROSCOPY Right 2017    Family History  Problem Relation Age of Onset   Lung cancer Mother 25       hx smoking   Lung cancer Father 23       hx smoking   Breast cancer Sister 62       in remission, now in mid 84s   Lung cancer Brother 70       smoker   Throat cancer Brother        alcohol   Colon cancer Neg Hx    Cervical cancer Neg Hx    Ovarian cancer Neg Hx    Social History   Socioeconomic History   Marital status: Divorced    Spouse name: Not on file   Number of children: 2   Years of education: 12   Highest education level: High school graduate  Occupational History   Occupation: retired Sales promotion account executive for [employer] resource strain: Not very hard   Food insecurity    Worry: Never true    Inability: Never true   Transportation needs    Medical: No    Non-medical: No  Tobacco Use   Smoking status: Never Smoker   Smokeless tobacco: Never Used  Substance and Sexual Activity   Alcohol use: Not Currently    Frequency: Never   Drug use: Never  Sexual activity: Not Currently  Lifestyle   Physical activity    Days per week: 0 days    Minutes per session: 0 min   Stress: Only a little  Relationships   Social connections    Talks on phone: Patient refused    Gets together: Patient refused    Attends religious service: Patient refused    Active member of club or organization: Patient refused    Attends meetings of clubs or organizations: Patient refused    Relationship status: Patient refused  Other Topics Concern   Not on file  Social History Narrative   Not on file    Outpatient Encounter Medications as of 02/18/2019  Medication Sig   amLODipine (NORVASC) 10 MG tablet Take 1 tablet (10 mg total) by mouth daily.   anastrozole (ARIMIDEX) 1 MG tablet TAKE 1 TABLET(1 MG) BY MOUTH DAILY   Cholecalciferol (VITAMIN D3) LIQD Take 6 drops by mouth daily.   Homeopathic Products (FRANKINCENSE UPLIFTING) OIL Take 7 drops by  mouth daily.   Misc Natural Products (SPLEEN) CAPS Take 2 capsules by mouth daily.   OVER THE COUNTER MEDICATION Take 4 Scoops by mouth daily. Pecta Sol-C (Mod. Citrus Pectin)   OVER THE COUNTER MEDICATION Take 2 Scoops by mouth daily. Fermented Mushroom Blend   OVER THE COUNTER MEDICATION Take 6 capsules by mouth daily. Cruciferous Complete   OVER THE COUNTER MEDICATION Nitric Balance 4 tsp daily   Specialty Vitamins Products (MAMMARY) TABS Take 2 tablets by mouth daily.   TURMERIC PO Take 7 drops by mouth daily.   UNABLE TO FIND Take 6 tablets by mouth daily. Med Name: Vitamin B 17   UNABLE TO FIND Take 10 drops by mouth daily. Med Name: Liquid Vitamin A   UNABLE TO FIND Med Name: Hydrex 2 vials daily   UNABLE TO FIND Med Name: Proglyco-SP 3 every other day   UNABLE TO FIND Med Name: Hemp Oil Complete 2 daily   UNABLE TO FIND Med Name: Artemisinin Complex 2 daily   UNABLE TO FIND Med Name: Adrena Calm 1/4 pump daily   [place] Name: Immuno Plus 3 drops daily   [place] Med Name: Trizomal Glutathione 5 tsp daily   UNABLE TO FIND Med Name: Vitamin C Drip 20,000 once weekly   UNABLE TO FIND Med Name: Myrrh 3 drops daily   UNABLE TO FIND Med Name: Entro Vite 6 per day   UNABLE TO FIND Med Name: Pea Protein Isolate 1 scoop per day   UNABLE TO FIND Med Name: Alen Blew 1 scoop per day   No facility-administered encounter medications on file as of 02/18/2019.     Activities of Daily Living In your present state of health, do you have any difficulty performing the following activities: 02/18/2019  Hearing? N  Vision? N  Difficulty concentrating or making decisions? N  Walking or climbing stairs? N  Dressing or bathing? N  Doing errands, shopping? N  Preparing Food and eating ? N  Using the Toilet? N  In the past six months, have you accidently leaked urine? N  Do you have problems with loss of bowel control? N  Managing your Medications? N   Managing your Finances? N  Housekeeping or managing your Housekeeping? N  Some recent data might be hidden    Patient Care Team: Bacigalupo, Dionne Bucy, MD as PCP - General (Family Medicine) Johny Blamer, [place] (Chiropractic Medicine) Debbora Dus, NP (Nurse Practitioner)  Assessment:   This is a routine wellness examination for Meredith Hamilton.  Exercise Activities and Dietary recommendations Current Exercise Habits: Home exercise routine, Type of exercise: stretching(bounces on yoga ball), Time (Minutes): 30, Frequency (Times/Week): 7, Weekly Exercise (Minutes/Week): 210, Intensity: Mild, Exercise limited by: None identified  Goals     DIET - REDUCE SUGAR INTAKE     Continue current diet plan of cutting out sugar in daily diet to help aid in weight loss.      Exercise 3x per week (30 min per time)     Recommend to exercise for 3 days a week for at least 30 minutes at a time.        Fall Risk: Fall Risk  02/18/2019 02/18/2019 01/25/2018 11/09/2017  Falls in the past year? 0 0 No No  Number falls in past yr: 0 0 - -  Injury with Fall? 0 0 - -    FALL RISK PREVENTION PERTAINING TO THE HOME:  Any stairs in or around the home? No  If so, are there any without handrails? N/A  Home free of loose throw rugs in walkways, pet beds, electrical cords, etc? Yes  Adequate lighting in your home to reduce risk of falls? Yes   ASSISTIVE DEVICES UTILIZED TO PREVENT FALLS:  Life alert? No  Use of a cane, walker or w/c? No  Grab bars in the bathroom? No  Shower chair or bench in shower? No  Elevated toilet seat or a handicapped toilet? No    TIMED UP AND GO:  Was the test performed? No .    Depression Screen PHQ 2/9 Scores 02/18/2019 01/25/2018 11/09/2017  PHQ - 2 Score 0 0 0     Cognitive Function     6CIT Screen 02/18/2019 01/25/2018  What Year? 0 points 0 points  What month? 0 points 0 points  What time? 0 points 0 points  Count back from 20 0 points 0 points  Months in  reverse 0 points 0 points  Repeat phrase 0 points 0 points  Total Score 0 0     There is no immunization history on file for this patient.  Qualifies for Shingles Vaccine? Yes . Due for Shingrix. Education has been provided regarding the importance of this vaccine. Pt has been advised to call insurance company to determine out of pocket expense. Advised may also receive vaccine at local pharmacy or Health Dept. Verbalized acceptance and understanding.  Tdap: Although this vaccine is not a covered service during a Wellness Exam, does the patient still wish to receive this vaccine today?  No .  Education has been provided regarding the importance of this vaccine. Advised may receive this vaccine at local pharmacy or Health Dept. Aware to provide a copy of the vaccination record if obtained from local pharmacy or Health Dept. Verbalized acceptance and understanding.  Flu Vaccine: Due for Flu vaccine. Does the patient want to receive this vaccine today?  No . Education has been provided regarding the importance of this vaccine but still declined. Advised may receive this vaccine at local pharmacy or Health Dept. Aware to provide a copy of the vaccination record if obtained from local pharmacy or Health Dept. Verbalized acceptance and understanding.  Pneumococcal Vaccine: Due for Pneumococcal vaccine. Does the patient want to receive this vaccine today?  No . Education has been provided regarding the importance of this vaccine but still declined. Advised may receive this vaccine at local pharmacy or Health Dept. Aware to provide a copy of  the vaccination record if obtained from local pharmacy or Health Dept. Verbalized acceptance and understanding.   Screening Tests Health Maintenance  Topic Date Due   COLONOSCOPY  05/31/1998   INFLUENZA VACCINE  07/10/2019 (Originally 11/10/2018)   PNA vac Low Risk Adult (1 of 2 - PCV13) 02/18/2020 (Originally 05/31/2013)   TETANUS/TDAP  02/10/2023 (Originally  06/01/1967)   MAMMOGRAM  05/09/2019   DEXA SCAN  04/24/2023   Hepatitis C Screening  Completed    Cancer Screenings:  Colorectal Screening: Referral to GI placed today. Pt aware the office will call re: appt.  Mammogram: Completed 05/08/17.   Bone Density: Completed 04/23/18. Results reflect OSTEOPENIA. Repeat every 5 years.   Lung Cancer Screening: (Low Dose CT Chest recommended if Age 62-80 years, 30 pack-year currently smoking OR have quit w/in 15years.) does not qualify.   Additional Screening:  Hepatitis C Screening: Up to date  Vision Screening: Recommended annual ophthalmology exams for early detection of glaucoma and other disorders of the eye.  Dental Screening: Recommended annual dental exams for proper oral hygiene  Community Resource Referral:  CRR required this visit?  No       Plan:  I have personally reviewed and addressed the Medicare Annual Wellness questionnaire and have noted the following in the patients chart:  A. Medical and social history B. Use of alcohol, tobacco or illicit drugs  C. Current medications and supplements D. Functional ability and status E.  Nutritional status F.  Physical activity G. Advance directives H. List of other physicians I.  Hospitalizations, surgeries, and ER visits in previous 12 months J.  [place] such as hearing and vision if needed, cognitive and depression L. Referrals and appointments   In addition, I have reviewed and discussed with patient certain preventive protocols, quality metrics, and best practice recommendations. A written personalized care plan for preventive services as well as general preventive health recommendations were provided to patient. Nurse Health Advisor  Signed,    Margi Edmundson [place], [place]  [id number] Nurse Health Advisor   Nurse Notes: Pt declines all vaccines that are due.

## 2019-02-18 ENCOUNTER — Ambulatory Visit (INDEPENDENT_AMBULATORY_CARE_PROVIDER_SITE_OTHER): Payer: Medicare HMO | Admitting: Family Medicine

## 2019-02-18 ENCOUNTER — Ambulatory Visit (INDEPENDENT_AMBULATORY_CARE_PROVIDER_SITE_OTHER): Payer: Medicare HMO

## 2019-02-18 ENCOUNTER — Encounter: Payer: Self-pay | Admitting: Family Medicine

## 2019-02-18 ENCOUNTER — Other Ambulatory Visit: Payer: Self-pay

## 2019-02-18 VITALS — BP 160/76 | HR 64 | Temp 98.4°F | Ht 66.0 in | Wt 193.2 lb

## 2019-02-18 VITALS — BP 138/75 | HR 64 | Temp 98.4°F | Resp 16 | Ht 66.0 in | Wt 193.0 lb

## 2019-02-18 DIAGNOSIS — C50812 Malignant neoplasm of overlapping sites of left female breast: Secondary | ICD-10-CM

## 2019-02-18 DIAGNOSIS — Z0000 Encounter for general adult medical examination without abnormal findings: Secondary | ICD-10-CM

## 2019-02-18 DIAGNOSIS — Z6831 Body mass index (BMI) 31.0-31.9, adult: Secondary | ICD-10-CM

## 2019-02-18 DIAGNOSIS — Z8639 Personal history of other endocrine, nutritional and metabolic disease: Secondary | ICD-10-CM

## 2019-02-18 DIAGNOSIS — E669 Obesity, unspecified: Secondary | ICD-10-CM | POA: Diagnosis not present

## 2019-02-18 DIAGNOSIS — I10 Essential (primary) hypertension: Secondary | ICD-10-CM

## 2019-02-18 DIAGNOSIS — Z1211 Encounter for screening for malignant neoplasm of colon: Secondary | ICD-10-CM | POA: Diagnosis not present

## 2019-02-18 DIAGNOSIS — M25532 Pain in left wrist: Secondary | ICD-10-CM | POA: Diagnosis not present

## 2019-02-18 DIAGNOSIS — Z170 Estrogen receptor positive status [ER+]: Secondary | ICD-10-CM

## 2019-02-18 NOTE — Progress Notes (Signed)
Patient: Meredith Hamilton, Female    DOB: February 25, 1951, 69 y.o.   MRN: [id number] Visit Date: 02/18/2019  Today's Provider: Lavon Paganini, MD   Chief Complaint  Patient presents with  . Annual Exam   Subjective:     Complete Physical Shabrina Rentas is a 68 y.o. female. She feels well. She reports exercising no. She reports she is sleeping well. 02/18/2019 AWV-with [facility] - order placed for GI referral for colonoscopy 02/09/2018 CPE 04/23/2018 BMD-osteopenia 05/2018 - mammogram -----------------------------------------------------------  L wrist pain No injury or trauma or strenuous activity Within last 2 months Pain in lateral wrist Worse with pronation and moving thumb Can radiate up arm Never had anything like this before Has noticed some swelling Tried icy-hot, CBD cream, cold laser therapy R hand dominant   Review of Systems  Constitutional: Negative.   HENT: Negative.   Eyes: Negative.   Respiratory: Negative.   Cardiovascular: Negative.   Gastrointestinal: Negative.   Endocrine: Negative.   Genitourinary: Negative.   Musculoskeletal: Negative.   Skin: Negative.   Allergic/Immunologic: Negative.   Neurological: Negative.   Psychiatric/Behavioral: Negative.     Social History   Socioeconomic History  . Marital status: Divorced    Spouse name: Not on file  . Number of children: 2  . Years of education: 44  . Highest education level: High school graduate  Occupational History  . Occupation: retired Sales promotion account executive for [employer]  . Financial resource strain: Not very hard  . Food insecurity    Worry: Never true    Inability: Never true  . Transportation needs    Medical: No    Non-medical: No  Tobacco Use  . Smoking status: Never Smoker  . Smokeless tobacco: Never Used  Substance and Sexual Activity  . Alcohol use: Not Currently    Frequency: Never  . Drug use: Never  . Sexual activity: Not Currently  Lifestyle  .  Physical activity    Days per week: 0 days    Minutes per session: 0 min  . Stress: Only a little  Relationships  . Social Herbalist on phone: Patient refused    Gets together: Patient refused    Attends religious service: Patient refused    Active member of club or organization: Patient refused    Attends meetings of clubs or organizations: Patient refused    Relationship status: Patient refused  . Intimate partner violence    Fear of current or ex partner: Patient refused    Emotionally abused: Patient refused    Physically abused: Patient refused    Forced sexual activity: Patient refused  Other Topics Concern  . Not on file  Social History Narrative  . Not on file    Past Medical History:  Diagnosis Date  . Breast CA ([place])   . Family history of breast cancer   . Family history of lung cancer   . Family history of throat cancer   . Hashimoto's disease   . Hypertension   . Non-celiac gluten sensitivity      Patient Active Problem List   Diagnosis Date Noted  . Genetic testing 05/24/2018  . Family history of breast cancer   . Family history of throat cancer   . Family history of lung cancer   . Breast cancer ([place]) 11/08/2017  . Status post mastectomy, left 11/08/2017  . Malignant neoplasm of overlapping sites of left breast in female, estrogen receptor positive ([place])  10/27/2017  . Anxiety 07/22/2017  . H/O Hashimoto thyroiditis 07/22/2017  . 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 Essential (primary) hypertension 11/08/2004  . Overweight 11/08/2004  . Rheumatoid arthritis ([place]) 06/07/2001    Past Surgical History:  Procedure Laterality Date  . APPENDECTOMY  1979  . [place]  . MASTECTOMY W/ SENTINEL NODE BIOPSY Left 2019  . RIGHT OOPHORECTOMY  1979   due to ovarian torsion  . URETEROSCOPY Right 2017    Her family history includes Breast cancer (age of onset: 10) in her sister; Lung cancer (age of onset: 62) in  her father and mother; Lung cancer (age of onset: 62) in her brother; Throat cancer in her brother. There is no history of Colon cancer, Cervical cancer, or Ovarian cancer.   Current Outpatient Medications:  .  amLODipine (NORVASC) 10 MG tablet, Take 1 tablet (10 mg total) by mouth daily., Disp: 90 tablet, Rfl: 3 .  anastrozole (ARIMIDEX) 1 MG tablet, TAKE 1 TABLET(1 MG) BY MOUTH DAILY, Disp: 90 tablet, Rfl: 3 .  Cholecalciferol (VITAMIN D3) LIQD, Take 6 drops by mouth daily., Disp: , Rfl:  .  Homeopathic Products (FRANKINCENSE UPLIFTING) OIL, Take 7 drops by mouth daily., Disp: , Rfl:  .  Misc Natural Products (SPLEEN) CAPS, Take 2 capsules by mouth daily., Disp: , Rfl:  .  OVER THE COUNTER MEDICATION, Take 4 Scoops by mouth daily. Pecta Sol-C (Mod. Citrus Pectin), Disp: , Rfl:  .  OVER THE COUNTER MEDICATION, Take 2 Scoops by mouth daily. Fermented Mushroom Blend, Disp: , Rfl:  .  OVER THE COUNTER MEDICATION, Take 6 capsules by mouth daily. Cruciferous Complete, Disp: , Rfl:  .  OVER THE COUNTER MEDICATION, Nitric Balance 4 tsp daily, Disp: , Rfl:  .  Specialty Vitamins Products (MAMMARY) TABS, Take 2 tablets by mouth daily., Disp: , Rfl:  .  TURMERIC PO, Take 7 drops by mouth daily., Disp: , Rfl:  .  UNABLE TO FIND, Take 6 tablets by mouth daily. Med Name: Vitamin B 17, Disp: , Rfl:  .  UNABLE TO FIND, Take 10 drops by mouth daily. Med Name: Liquid Vitamin A, Disp: , Rfl:  .  UNABLE TO FIND, Med Name: Hydrex 2 vials daily, Disp: , Rfl:  .  UNABLE TO FIND, Med Name: Proglyco-SP 3 every other day, Disp: , Rfl:  .  UNABLE TO FIND, Med Name: Hemp Oil Complete 2 daily, Disp: , Rfl:  .  UNABLE TO FIND, Med Name: Artemisinin Complex 2 daily, Disp: , Rfl:  .  UNABLE TO FIND, Med Name: Adrena Calm 1/4 pump daily, Disp: , Rfl:  .  UNABLE TO FIND, Med Name: Immuno Plus 3 drops daily, Disp: , Rfl:  .  UNABLE TO FIND, Med Name: Rockne Menghini Glutathione 5 tsp daily, Disp: , Rfl:  .  UNABLE TO FIND, Med  Name: Vitamin C Drip 20,000 once weekly, Disp: , Rfl:  .  UNABLE TO FIND, Med Name: Myrrh 3 drops daily, Disp: , Rfl:  .  UNABLE TO FIND, Med Name: [place] Roche Vite 6 per day, Disp: , Rfl:  .  UNABLE TO FIND, Med Name: Pea Protein Isolate 1 scoop per day, Disp: , Rfl:  .  UNABLE TO FIND, Med Name: Nourish Greens 1 scoop per day, Disp: , Rfl:   Patient Care Team: Virginia Crews, MD as PCP - General (Family Medicine) Johny Blamer, [place] (Chiropractic Medicine) Debbora Dus, NP (Nurse Practitioner)  Objective:    Vitals: BP (!) 158/75 (BP Location: Right Arm, Patient Position: Sitting, Cuff Size: Large)   Pulse 64   Temp 98.4 F (36.9 C) (Oral)   Resp 16   Ht 5\' 6"  (1.676 m)   Wt 193 lb (87.5 kg)   BMI 31.15 kg/m   Physical Exam Vitals signs reviewed.  Constitutional:      General: She is not in acute distress.    Appearance: Normal appearance. She is well-developed. She is not diaphoretic.  HENT:     Head: Normocephalic and atraumatic.     Right Ear: Tympanic membrane, ear canal and external ear normal.     Left Ear: Tympanic membrane, ear canal and external ear normal.  Eyes:     General: No scleral icterus.    Conjunctiva/sclera: Conjunctivae normal.     Pupils: Pupils are equal, round, and reactive to light.  Neck:     Musculoskeletal: Neck supple.     Thyroid: No thyromegaly.  Cardiovascular:     Rate and Rhythm: Normal rate and regular rhythm.     Pulses: Normal pulses.     Heart sounds: Normal heart sounds. No murmur.  Pulmonary:     Effort: Pulmonary effort is normal. No respiratory distress.     Breath sounds: Normal breath sounds. No wheezing or rales.  Abdominal:     General: There is no distension.     Palpations: Abdomen is soft.     Tenderness: There is no abdominal tenderness.  Musculoskeletal:        General: No deformity.     Right lower leg: No edema.     Left lower leg: No edema.     Comments: L Wrist: Inspection normal with no visible  erythema or deformity, but mild swelling on lateral wrist. ROM smooth and normal with good flexion and extension and ulnar/radial deviation that is symmetrical with opposite wrist. TTP over mid and lateral L wrist +Finkelsteins. Strength 5/5 in all directions without pain.  Lymphadenopathy:     Cervical: No cervical adenopathy.  Skin:    General: Skin is warm and dry.     Capillary Refill: Capillary refill takes less than 2 seconds.     Findings: No rash.  Neurological:     Mental Status: She is alert and oriented to person, place, and time. Mental status is at baseline.  Psychiatric:        Mood and Affect: Mood normal.        Behavior: Behavior normal.        Thought Content: Thought content normal.     Activities of Daily Living In your present state of health, do you have any difficulty performing the following activities: 02/18/2019  Hearing? N  Vision? N  Difficulty concentrating or making decisions? N  Walking or climbing stairs? N  Dressing or bathing? N  Doing errands, shopping? N  Preparing Food and eating ? N  Using the Toilet? N  In the past six months, have you accidently leaked urine? N  Do you have problems with loss of bowel control? N  Managing your Medications? N  Managing your Finances? N  Housekeeping or managing your Housekeeping? N  Some recent data might be hidden    Fall Risk Assessment Fall Risk  02/18/2019 02/18/2019 01/25/2018 11/09/2017  Falls in the past year? 0 0 No No  Number falls in past yr: 0 0 - -  Injury with Fall? 0 0 - -  Depression Screen PHQ 2/9 Scores 02/18/2019 01/25/2018 11/09/2017  PHQ - 2 Score 0 0 0    6CIT Screen 02/18/2019  What Year? 0 points  What month? 0 points  What time? 0 points  Count back from 20 0 points  Months in reverse 0 points  Repeat phrase 0 points  Total Score 0   Audit-C Alcohol Use Screening   Alcohol Use Disorder Test (AUDIT) 11/09/2017 02/09/2018 02/18/2019  1. How often do you have a drink  containing alcohol? 0 0 0  2. How many drinks containing alcohol do you have on a typical day when you are drinking? 0 0 0  3. How often do you have six or more drinks on one occasion? 0 0 0  AUDIT-C Score 0 0 0    A score of 3 or more in women, and 4 or more in men indicates increased risk for alcohol abuse, EXCEPT if all of the points are from question 1     Assessment & Plan:    Annual Physical Reviewed patient's Family Medical History Reviewed and updated list of patient's medical providers Assessment of cognitive impairment was done Assessed patient's functional ability Established a written schedule for health screening [place] Completed and Reviewed  Exercise Activities and Dietary recommendations Goals    . DIET - REDUCE SUGAR INTAKE     Continue current diet plan of cutting out sugar in daily diet to help aid in weight loss.     . Exercise 3x per week (30 min per time)     Recommend to exercise for 3 days a week for at least 30 minutes at a time.         There is no immunization history on file for this patient.  Health Maintenance  Topic Date Due  . COLONOSCOPY  05/31/1998  . INFLUENZA VACCINE  07/10/2019 (Originally 11/10/2018)  . PNA vac Low Risk Adult (1 of 2 - PCV13) 02/18/2020 (Originally 05/31/2013)  . TETANUS/TDAP  02/10/2023 (Originally 06/01/1967)  . MAMMOGRAM  05/09/2019  . DEXA SCAN  04/24/2023  . Hepatitis C Screening  Completed     Discussed health benefits of physical activity, and encouraged her to engage in regular exercise appropriate for her age and condition.    ------------------------------------------------------------------------------------------------------------  Problem List Items Addressed This Visit      Cardiovascular and Mediastinum   Essential (primary) hypertension    Well-controlled on manual recheck Suspect patient has some whitecoat hypertension Continue current medications Reviewed recent  metabolic panel        Other   H/O Hashimoto thyroiditis    Recheck TSH      Relevant Orders   TSH   Malignant neoplasm of overlapping sites of left breast in female, estrogen receptor positive ([place])    Followed by oncology Continue Arimidex Up-to-date on mammography      Obesity    Discussed importance of healthy weight management Discussed diet and exercise       Relevant Orders   Lipid panel   Left wrist pain    New problem Some evidence of de Quervain's tenosynovitis on exam with positive Finkelstein's and tenderness to palpation over base of thumb and lateral wrist Also has some mid wrist pain as well Continue conservative management Referral to orthopedics Patient will defer imaging to orthopedics      Relevant Orders   Ambulatory referral to Orthopedic Surgery    Other Visit Diagnoses    Encounter for annual physical exam    -  Primary   Relevant Orders   Lipid panel   TSH       Return in about 6 months (around 08/18/2019) for chronic disease f/u.   The entirety of the information documented in the History of Present Illness, Review of Systems and Physical Exam were personally obtained by me. Portions of this information were initially documented by Lynford Humphrey, CMA and reviewed by me for thoroughness and accuracy.    Shikira Folino, Dionne Bucy, MD MPH [facility]

## 2019-02-18 NOTE — Patient Instructions (Addendum)
[name] , Thank you for taking time to come for your Medicare Wellness Visit. I appreciate your ongoing commitment to your health goals. Please review the following plan we discussed and let me know if I can assist you in the future.   Screening recommendations/referrals: Colonoscopy: Referral to GI placed today. Pt aware the office will call re: appt. Mammogram: Up to date, due 04/2022 Bone Density: Up to date, due 04/2023 Recommended yearly ophthalmology/optometry visit for glaucoma screening and checkup Recommended yearly dental visit for hygiene and checkup  Vaccinations: Influenza vaccine: Pt declines today.  Pneumococcal vaccine: Pt declines today.  Tdap vaccine: Pt declines today.  Shingles vaccine: Pt declines today.     Advanced directives: Advance directive discussed with you today. Even though you declined this today please call our office should you change your mind and we can give you the proper paperwork for you to fill out.  Conditions/risks identified: Continue to work on eating a healthy diet and exercising 3 days a week for at least 30 minutes at a time with a moderate intensity level.   Next appointment: 10:00 AM today with Dr Brita Romp.    Preventive Care 80 Years and Older, Female Preventive care refers to lifestyle choices and visits with your health care provider that can promote health and wellness. What does preventive care include?  A yearly physical exam. This is also called an annual well check.  Dental exams once or twice a year.  Routine eye exams. Ask your health care provider how often you should have your eyes checked.  Personal lifestyle choices, including:  Daily care of your teeth and gums.  Regular physical activity.  Eating a healthy diet.  Avoiding tobacco and drug use.  Limiting alcohol use.  Practicing safe sex.  Taking low-dose aspirin every day.  Taking vitamin and mineral supplements as recommended by your health care  provider. What happens during an annual well check? The services and screenings done by your health care provider during your annual well check will depend on your age, overall health, lifestyle risk factors, and family history of disease. Counseling  Your health care provider may ask you questions about your:  Alcohol use.  Tobacco use.  Drug use.  Emotional well-being.  Home and relationship well-being.  Sexual activity.  Eating habits.  History of falls.  Memory and ability to understand (cognition).  Work and work Statistician.  Reproductive health. Screening  You may have the following tests or measurements:  Height, weight, and BMI.  Blood pressure.  Lipid and cholesterol levels. These may be checked every 5 years, or more frequently if you are over 45 years old.  Skin check.  Lung cancer screening. You may have this screening every year starting at age 30 if you have a 30-pack-year history of smoking and currently smoke or have quit within the past 15 years.  Fecal occult blood test (FOBT) of the stool. You may have this test every year starting at age 33.  Flexible sigmoidoscopy or colonoscopy. You may have a sigmoidoscopy every 5 years or a colonoscopy every 10 years starting at age 75.  Hepatitis C blood test.  Hepatitis B blood test.  Sexually transmitted disease (STD) testing.  Diabetes screening. This is done by checking your blood sugar (glucose) after you have not eaten for a while (fasting). You may have this done every 1-3 years.  Bone density scan. This is done to screen for osteoporosis. You may have this done starting at age 64.  Mammogram. This may be done every 1-2 years. Talk to your health care provider about how often you should have regular mammograms. Talk with your health care provider about your test results, treatment options, and if necessary, the need for more tests. Vaccines  Your health care provider may recommend certain  vaccines, such as:  Influenza vaccine. This is recommended every year.  Tetanus, diphtheria, and acellular pertussis (Tdap, Td) vaccine. You may need a Td booster every 10 years.  Zoster vaccine. You may need this after age 59.  Pneumococcal 13-valent conjugate (PCV13) vaccine. One dose is recommended after age 22.  Pneumococcal polysaccharide (PPSV23) vaccine. One dose is recommended after age 69. Talk to your health care provider about which screenings and vaccines you need and how often you need them. This information is not intended to replace advice given to you by your health care provider. Make sure you discuss any questions you have with your health care provider. Document Released: 04/24/2015 Document Revised: 12/16/2015 Document Reviewed: 01/27/2015 Elsevier Interactive Patient Education  2017 [place] Prevention in the Home Falls can cause injuries. They can happen to people of all ages. There are many things you can do to make your home safe and to help prevent falls. What can I do on the outside of my home?  Regularly fix the edges of walkways and driveways and fix any cracks.  Remove anything that might make you trip as you walk through a door, such as a raised step or threshold.  Trim any bushes or trees on the path to your home.  Use bright outdoor lighting.  Clear any walking paths of anything that might make someone trip, such as rocks or tools.  Regularly check to see if handrails are loose or broken. Make sure that both sides of any steps have handrails.  Any raised decks and porches should have guardrails on the edges.  Have any leaves, snow, or ice cleared regularly.  Use sand or salt on walking paths during winter.  Clean up any spills in your garage right away. This includes oil or grease spills. What can I do in the bathroom?  Use night lights.  Install grab bars by the toilet and in the tub and shower. Do not use towel bars as grab  bars.  Use non-skid mats or decals in the tub or shower.  If you need to sit down in the shower, use a plastic, non-slip stool.  Keep the floor dry. Clean up any water that spills on the floor as soon as it happens.  Remove soap buildup in the tub or shower regularly.  Attach bath mats securely with double-sided non-slip rug tape.  Do not have throw rugs and other things on the floor that can make you trip. What can I do in the bedroom?  Use night lights.  Make sure that you have a light by your bed that is easy to reach.  Do not use any sheets or blankets that are too big for your bed. They should not hang down onto the floor.  Have a firm chair that has side arms. You can use this for support while you get dressed.  Do not have throw rugs and other things on the floor that can make you trip. What can I do in the kitchen?  Clean up any spills right away.  Avoid walking on wet floors.  Keep items that you use a lot in easy-to-reach places.  If you need to reach  something above you, use a strong step stool that has a grab bar.  Keep electrical cords out of the way.  Do not use floor polish or wax that makes floors slippery. If you must use wax, use non-skid floor wax.  Do not have throw rugs and other things on the floor that can make you trip. What can I do with my stairs?  Do not leave any items on the stairs.  Make sure that there are handrails on both sides of the stairs and use them. Fix handrails that are broken or loose. Make sure that handrails are as long as the stairways.  Check any carpeting to make sure that it is firmly attached to the stairs. Fix any carpet that is loose or worn.  Avoid having throw rugs at the top or bottom of the stairs. If you do have throw rugs, attach them to the floor with carpet tape.  Make sure that you have a light switch at the top of the stairs and the bottom of the stairs. If you do not have them, ask someone to add them for  you. What else can I do to help prevent falls?  Wear shoes that:  Do not have high heels.  Have rubber bottoms.  Are comfortable and fit you well.  Are closed at the toe. Do not wear sandals.  If you use a stepladder:  Make sure that it is fully opened. Do not climb a closed stepladder.  Make sure that both sides of the stepladder are locked into place.  Ask someone to hold it for you, if possible.  Clearly mark and make sure that you can see:  Any grab bars or handrails.  First and last steps.  Where the edge of each step is.  Use tools that help you move around (mobility aids) if they are needed. These include:  Canes.  Walkers.  Scooters.  Crutches.  Turn on the lights when you go into a dark area. Replace any light bulbs as soon as they burn out.  Set up your furniture so you have a clear path. Avoid moving your furniture around.  If any of your floors are uneven, fix them.  If there are any pets around you, be aware of where they are.  Review your medicines with your doctor. Some medicines can make you feel dizzy. This can increase your chance of falling. Ask your doctor what other things that you can do to help prevent falls. This information is not intended to replace advice given to you by your health care provider. Make sure you discuss any questions you have with your health care provider. Document Released: 01/22/2009 Document Revised: 09/03/2015 Document Reviewed: 05/02/2014 Elsevier Interactive Patient Education  2017 [employer].

## 2019-02-18 NOTE — Assessment & Plan Note (Signed)
Well-controlled on manual recheck Suspect patient has some whitecoat hypertension Continue current medications Reviewed recent metabolic panel

## 2019-02-18 NOTE — Patient Instructions (Signed)
Preventive Care 38 Years and Older, Female Preventive care refers to lifestyle choices and visits with your health care provider that can promote health and wellness. This includes:  A yearly physical exam. This is also called an annual well check.  Regular dental and eye exams.  Immunizations.  Screening for certain conditions.  Healthy lifestyle choices, such as diet and exercise. What can I expect for my preventive care visit? Physical exam Your health care provider will check:  Height and weight. These may be used to calculate body mass index (BMI), which is a measurement that tells if you are at a healthy weight.  Heart rate and blood pressure.  Your skin for abnormal spots. Counseling Your health care provider may ask you questions about:  Alcohol, tobacco, and drug use.  Emotional well-being.  Home and relationship well-being.  Sexual activity.  Eating habits.  History of falls.  Memory and ability to understand (cognition).  Work and work Statistician.  Pregnancy and menstrual history. What immunizations do I need?  Influenza (flu) vaccine  This is recommended every year. Tetanus, diphtheria, and pertussis (Tdap) vaccine  You may need a Td booster every 10 years. Varicella (chickenpox) vaccine  You may need this vaccine if you have not already been vaccinated. Zoster (shingles) vaccine  You may need this after age 33. Pneumococcal conjugate (PCV13) vaccine  One dose is recommended after age 33. Pneumococcal polysaccharide (PPSV23) vaccine  One dose is recommended after age 72. Measles, mumps, and rubella (MMR) vaccine  You may need at least one dose of MMR if you were born in 1957 or later. You may also need a second dose. Meningococcal conjugate (MenACWY) vaccine  You may need this if you have certain conditions. Hepatitis A vaccine  You may need this if you have certain conditions or if you travel or work in places where you may be exposed  to hepatitis A. Hepatitis B vaccine  You may need this if you have certain conditions or if you travel or work in places where you may be exposed to hepatitis B. Haemophilus influenzae type b (Hib) vaccine  You may need this if you have certain conditions. You may receive vaccines as individual doses or as more than one vaccine together in one shot (combination vaccines). Talk with your health care provider about the risks and benefits of combination vaccines. What tests do I need? Blood tests  Lipid and cholesterol levels. These may be checked every 5 years, or more frequently depending on your overall health.  Hepatitis C test.  Hepatitis B test. Screening  Lung cancer screening. You may have this screening every year starting at age 39 if you have a 30-pack-year history of smoking and currently smoke or have quit within the past 15 years.  Colorectal cancer screening. All adults should have this screening starting at age 36 and continuing until age 15. Your health care provider may recommend screening at age 23 if you are at increased risk. You will have tests every 1-10 years, depending on your results and the type of screening test.  Diabetes screening. This is done by checking your blood sugar (glucose) after you have not eaten for a while (fasting). You may have this done every 1-3 years.  Mammogram. This may be done every 1-2 years. Talk with your health care provider about how often you should have regular mammograms.  BRCA-related cancer screening. This may be done if you have a family history of breast, ovarian, tubal, or peritoneal cancers.  Other tests  Sexually transmitted disease (STD) testing.  Bone density scan. This is done to screen for osteoporosis. You may have this done starting at age 76. Follow these instructions at home: Eating and drinking  Eat a diet that includes fresh fruits and vegetables, whole grains, lean protein, and low-fat dairy products. Limit  your intake of foods with high amounts of sugar, saturated fats, and salt.  Take vitamin and mineral supplements as recommended by your health care provider.  Do not drink alcohol if your health care provider tells you not to drink.  If you drink alcohol: ? Limit how much you have to 0-1 drink a day. ? Be aware of how much alcohol is in your drink. In the [place] one drink equals one 12 oz bottle of beer (355 mL), one 5 oz glass of wine (148 mL), or one 1 oz glass of hard liquor (44 mL). Lifestyle  Take daily care of your teeth and gums.  Stay active. Exercise for at least 30 minutes on 5 or more days each week.  Do not use any products that contain nicotine or tobacco, such as cigarettes, e-cigarettes, and chewing tobacco. If you need help quitting, ask your health care provider.  If you are sexually active, practice safe sex. Use a condom or other form of protection in order to prevent STIs (sexually transmitted infections).  Talk with your health care provider about taking a low-dose aspirin or statin. What's next?  Go to your health care provider once a year for a well check visit.  Ask your health care provider how often you should have your eyes and teeth checked.  Stay up to date on all vaccines. This information is not intended to replace advice given to you by your health care provider. Make sure you discuss any questions you have with your health care provider. Document Released: 04/24/2015 Document Revised: 03/22/2018 Document Reviewed: 03/22/2018 Elsevier Patient Education  2020 [employer].

## 2019-02-18 NOTE — Assessment & Plan Note (Signed)
Discussed importance of healthy weight management Discussed diet and exercise  

## 2019-02-18 NOTE — Assessment & Plan Note (Signed)
Followed by oncology Continue Arimidex Up-to-date on mammography

## 2019-02-18 NOTE — Assessment & Plan Note (Signed)
Recheck TSH 

## 2019-02-18 NOTE — Assessment & Plan Note (Signed)
New problem Some evidence of de Quervain's tenosynovitis on exam with positive Finkelstein's and tenderness to palpation over base of thumb and lateral wrist Also has some mid wrist pain as well Continue conservative management Referral to orthopedics Patient will defer imaging to orthopedics

## 2019-02-19 ENCOUNTER — Telehealth: Payer: Self-pay

## 2019-02-19 LAB — TSH: TSH: 1.78 u[IU]/mL (ref 0.450–4.500)

## 2019-02-19 LAB — LIPID PANEL
Chol/HDL Ratio: 2.7 ratio (ref 0.0–4.4)
Cholesterol, Total: 162 mg/dL (ref 100–199)
HDL: 61 mg/dL (ref >39)
LDL Chol Calc (NIH): 73 mg/dL (ref 0–99)
Triglycerides: 167 mg/dL — ABNORMAL HIGH (ref 0–149)
VLDL Cholesterol Cal: 28 mg/dL (ref 5–40)

## 2019-02-19 NOTE — Telephone Encounter (Signed)
Patient advised as below.  

## 2019-02-19 NOTE — Telephone Encounter (Signed)
-----   Message from Virginia Crews, MD sent at 02/19/2019 12:07 PM EST ----- Normal labs

## 2019-02-21 ENCOUNTER — Encounter: Payer: Self-pay | Admitting: *Deleted

## 2019-04-09 ENCOUNTER — Telehealth: Payer: Self-pay

## 2019-04-09 NOTE — Telephone Encounter (Signed)
-----   Message from Vanetta Mulders, [place] sent at 02/19/2019 10:39 AM EST ----- Regarding: Jaunuary 2021 Colon Request Patient would like to have her colonoscopy scheduled in January 2021 at [facility].  Thanks [employer]

## 2019-04-09 NOTE — Telephone Encounter (Signed)
Contacted patient in regard to scheduling her colonoscopy.  LVM for her to call office back to schedule.  Thanks [employer]

## 2019-04-25 ENCOUNTER — Encounter: Payer: Self-pay | Admitting: *Deleted

## 2019-04-25 ENCOUNTER — Telehealth: Payer: Self-pay

## 2019-04-25 NOTE — Telephone Encounter (Signed)
LVM with patient to call office to schedule her colonoscopy.  Thanks [employer]

## 2019-04-29 ENCOUNTER — Other Ambulatory Visit: Payer: Self-pay

## 2019-04-29 NOTE — Progress Notes (Signed)
Patient pre screened for office appointment, no questions or concerns today. Patient reminded of upcoming appointment time and date. 

## 2019-04-30 ENCOUNTER — Inpatient Hospital Stay: Payer: Medicare HMO | Attending: Oncology | Admitting: Oncology

## 2019-04-30 ENCOUNTER — Other Ambulatory Visit: Payer: Self-pay

## 2019-04-30 ENCOUNTER — Inpatient Hospital Stay: Payer: Medicare HMO

## 2019-04-30 VITALS — BP 177/61 | HR 58 | Temp 98.1°F | Ht 66.0 in | Wt 197.0 lb

## 2019-04-30 DIAGNOSIS — Z803 Family history of malignant neoplasm of breast: Secondary | ICD-10-CM | POA: Diagnosis not present

## 2019-04-30 DIAGNOSIS — Z5181 Encounter for therapeutic drug level monitoring: Secondary | ICD-10-CM

## 2019-04-30 DIAGNOSIS — C50912 Malignant neoplasm of unspecified site of left female breast: Secondary | ICD-10-CM

## 2019-04-30 DIAGNOSIS — Z90721 Acquired absence of ovaries, unilateral: Secondary | ICD-10-CM | POA: Insufficient documentation

## 2019-04-30 DIAGNOSIS — Z08 Encounter for follow-up examination after completed treatment for malignant neoplasm: Secondary | ICD-10-CM | POA: Diagnosis not present

## 2019-04-30 DIAGNOSIS — Z170 Estrogen receptor positive status [ER+]: Secondary | ICD-10-CM

## 2019-04-30 DIAGNOSIS — Z801 Family history of malignant neoplasm of trachea, bronchus and lung: Secondary | ICD-10-CM | POA: Insufficient documentation

## 2019-04-30 DIAGNOSIS — Z9012 Acquired absence of left breast and nipple: Secondary | ICD-10-CM | POA: Insufficient documentation

## 2019-04-30 DIAGNOSIS — Z853 Personal history of malignant neoplasm of breast: Secondary | ICD-10-CM

## 2019-04-30 DIAGNOSIS — R918 Other nonspecific abnormal finding of lung field: Secondary | ICD-10-CM | POA: Insufficient documentation

## 2019-04-30 DIAGNOSIS — Z79899 Other long term (current) drug therapy: Secondary | ICD-10-CM | POA: Insufficient documentation

## 2019-04-30 DIAGNOSIS — Z8249 Family history of ischemic heart disease and other diseases of the circulatory system: Secondary | ICD-10-CM | POA: Insufficient documentation

## 2019-04-30 DIAGNOSIS — Z79811 Long term (current) use of aromatase inhibitors: Secondary | ICD-10-CM

## 2019-04-30 LAB — CBC WITH DIFFERENTIAL/PLATELET
Abs Immature Granulocytes: 0.01 10*3/uL (ref 0.00–0.07)
Basophils Absolute: 0 10*3/uL (ref 0.0–0.1)
Basophils Relative: 0 %
Eosinophils Absolute: 0.1 10*3/uL (ref 0.0–0.5)
Eosinophils Relative: 2 %
HCT: 40.7 % (ref 36.0–46.0)
Hemoglobin: 12.9 g/dL (ref 12.0–15.0)
Immature Granulocytes: 0 %
Lymphocytes Relative: 44 %
Lymphs Abs: 2.3 10*3/uL (ref 0.7–4.0)
MCH: 30.8 pg (ref 26.0–34.0)
MCHC: 31.7 g/dL (ref 30.0–36.0)
MCV: 97.1 fL (ref 80.0–100.0)
Monocytes Absolute: 0.5 10*3/uL (ref 0.1–1.0)
Monocytes Relative: 9 %
Neutro Abs: 2.4 10*3/uL (ref 1.7–7.7)
Neutrophils Relative %: 45 %
Platelets: 143 10*3/uL — ABNORMAL LOW (ref 150–400)
RBC: 4.19 MIL/uL (ref 3.87–5.11)
RDW: 12.8 % (ref 11.5–15.5)
WBC: 5.2 10*3/uL (ref 4.0–10.5)
nRBC: 0 % (ref 0.0–0.2)

## 2019-04-30 LAB — COMPREHENSIVE METABOLIC PANEL
ALT: 14 U/L (ref 0–44)
AST: 18 U/L (ref 15–41)
Albumin: 4.6 g/dL (ref 3.5–5.0)
Alkaline Phosphatase: 74 U/L (ref 38–126)
Anion gap: 8 (ref 5–15)
BUN: 14 mg/dL (ref 8–23)
CO2: 25 mmol/L (ref 22–32)
Calcium: 9.6 mg/dL (ref 8.9–10.3)
Chloride: 109 mmol/L (ref 98–111)
Creatinine, Ser: 0.7 mg/dL (ref 0.44–1.00)
GFR calc Af Amer: >60 mL/min (ref >60)
GFR calc non Af Amer: >60 mL/min (ref >60)
Glucose, Bld: 97 mg/dL (ref 70–99)
Potassium: 3.3 mmol/L — ABNORMAL LOW (ref 3.5–5.1)
Sodium: 142 mmol/L (ref 135–145)
Total Bilirubin: 0.8 mg/dL (ref 0.3–1.2)
Total Protein: 7.4 g/dL (ref 6.5–8.1)

## 2019-04-30 NOTE — Progress Notes (Signed)
Patient stated that she had been doing well with no complaints. Patient's Bone Density was done on 04/23/2018.

## 2019-05-02 NOTE — Progress Notes (Signed)
Hematology/Oncology Consult note [facility]  Telephone:([phone] [phone]  Patient Care Team: Virginia Crews, MD as PCP - General (Family Medicine) Johny Blamer, [place] (Chiropractic Medicine) Debbora Dus, NP (Nurse Practitioner)   Name of the patient: Meredith Hamilton  [id number]  03/11/1951   Date of visit: 05/02/19  Diagnosis- h/o left breast cancer pathological prognostic stage IB T2N2M0 ER PR positive her 2 negative s/p mastectomy and axillary LN dissection   Chief complaint/ Reason for visit-routine follow-up of breast cancer  Heme/Onc history: Patient is a 69 year old female who was found to have an abnormal screening mammogram at an outside facility in [place] in February 2019. This was followed by a mastectomy and a sentinel lymph node biopsy in April 2019. Intraoperative frozen section of the lymph node was positive and this was converted to an axillary lymph node dissection. Final pathology revealed mixed pathology of invasive mammary as well as invasive lobular carcinoma. There were 2 foci measuring 2.1 and 0.8 cm. Overall grade 2. ER PR strongly positive greater than 90% and HER-2 was negative. 5 out of 15 axillary lymph nodes were positive for metastatic disease with the largest deposit being 1.8 cm and extranodal extension was present. Perineural invasion was identified. No LV I. Final margins were clear pathological stage T2N2A. She was recommended adjuvant chemotherapy and postmastectomy radiation. However patient did not want to go through it and eventually moved her care to [facility]. She has also been seen by surgical oncology in [facility] recently in July 2019. She has been pursuing alternative therapy with vitamin C infusions as well as other nutritional supplements.  Patient was started on Arimidex in November 2019   Interval history-patient is tolerating Arimidex well without any significant side  effects.  Reports her appetite and weight are stable.  Denies any new aches or pains anywhere.  ECOG PS- 1 Pain scale- 0   Review of systems- Review of Systems  Constitutional: Negative for chills, fever, malaise/fatigue and weight loss.  HENT: Negative for congestion, ear discharge and nosebleeds.   Eyes: Negative for blurred vision.  Respiratory: Negative for cough, hemoptysis, sputum production, shortness of breath and wheezing.   Cardiovascular: Negative for chest pain, palpitations, orthopnea and claudication.  Gastrointestinal: Negative for abdominal pain, blood in stool, constipation, diarrhea, heartburn, melena, nausea and vomiting.  Genitourinary: Negative for dysuria, flank pain, frequency, hematuria and urgency.  Musculoskeletal: Negative for back pain, joint pain and myalgias.  Skin: Negative for rash.  Neurological: Negative for dizziness, tingling, focal weakness, seizures, weakness and headaches.  Endo/Heme/Allergies: Does not bruise/bleed easily.  Psychiatric/Behavioral: Negative for depression and suicidal ideas. The patient does not have insomnia.       Allergies  Allergen Reactions  . Sulfa Antibiotics Swelling  . Tramadol Other (See Comments)    Other reaction(s): Hallucination     Past Medical History:  Diagnosis Date  . Breast CA ([place])   . Family history of breast cancer   . Family history of lung cancer   . Family history of throat cancer   . Hashimoto's disease   . Hypertension   . Non-celiac gluten sensitivity      Past Surgical History:  Procedure Laterality Date  . APPENDECTOMY  1979  . Colon  . MASTECTOMY W/ SENTINEL NODE BIOPSY Left 2019  . RIGHT OOPHORECTOMY  1979   due to ovarian torsion  . URETEROSCOPY Right 2017    Social History   Socioeconomic History  .  Marital status: Divorced    Spouse name: Not on file  . Number of children: 2  . Years of education: 44  . Highest education level: High school graduate   Occupational History  . Occupation: retired Sales promotion account executive for [employer]  Tobacco Use  . Smoking status: Never Smoker  . Smokeless tobacco: Never Used  Substance and Sexual Activity  . Alcohol use: Not Currently  . Drug use: Never  . Sexual activity: Not Currently  Other Topics Concern  . Not on file  Social History Narrative  . Not on file   Social Determinants of Health   Financial Resource Strain: Low Risk   . Difficulty of Paying Living Expenses: Not very hard  Food Insecurity:   . Worried About Charity fundraiser in the Last Year: Not on file  . Ran Out of Food in the Last Year: Not on file  Transportation Needs:   . Lack of Transportation (Medical): Not on file  . Lack of Transportation (Non-Medical): Not on file  Physical Activity: Inactive  . Days of Exercise per Week: 0 days  . Minutes of Exercise per Session: 0 min  Stress:   . Feeling of Stress : Not on file  Social Connections:   . Frequency of Communication with Friends and Family: Not on file  . Frequency of Social Gatherings with Friends and Family: Not on file  . Attends Religious Services: Not on file  . Active Member of Clubs or Organizations: Not on file  . Attends Archivist Meetings: Not on file  . Marital Status: Not on file  Intimate Partner Violence:   . Fear of Current or Ex-Partner: Not on file  . Emotionally Abused: Not on file  . Physically Abused: Not on file  . Sexually Abused: Not on file    Family History  Problem Relation Age of Onset  . Lung cancer Mother 23       hx smoking  . Lung cancer Father 79       hx smoking  . Breast cancer Sister 26       in remission, now in mid 51s  . Lung cancer Brother 75       smoker  . Throat cancer Brother        alcohol  . Colon cancer Neg Hx   . Cervical cancer Neg Hx   . Ovarian cancer Neg Hx      Current Outpatient Medications:  .  amLODipine (NORVASC) 10 MG tablet, Take 1 tablet (10 mg total) by mouth daily., Disp:  90 tablet, Rfl: 3 .  anastrozole (ARIMIDEX) 1 MG tablet, TAKE 1 TABLET(1 MG) BY MOUTH DAILY, Disp: 90 tablet, Rfl: 3 .  Cholecalciferol (VITAMIN D3) LIQD, Take 6 drops by mouth daily., Disp: , Rfl:  .  Homeopathic Products (FRANKINCENSE UPLIFTING) OIL, Take 7 drops by mouth daily., Disp: , Rfl:  .  Misc Natural Products (SPLEEN) CAPS, Take 2 capsules by mouth daily., Disp: , Rfl:  .  OVER THE COUNTER MEDICATION, Take 4 Scoops by mouth daily. Pecta Sol-C (Mod. Citrus Pectin), Disp: , Rfl:  .  OVER THE COUNTER MEDICATION, Take 2 Scoops by mouth daily. Fermented Mushroom Blend, Disp: , Rfl:  .  OVER THE COUNTER MEDICATION, Take 6 capsules by mouth daily. Cruciferous Complete, Disp: , Rfl:  .  OVER THE COUNTER MEDICATION, Nitric Balance 4 tsp daily, Disp: , Rfl:  .  Specialty Vitamins Products (MAMMARY) TABS, Take 2 tablets by mouth daily.,  Disp: , Rfl:  .  TURMERIC PO, Take 7 drops by mouth daily., Disp: , Rfl:  .  UNABLE TO FIND, Take 6 tablets by mouth daily. Med Name: Vitamin B 17, Disp: , Rfl:  .  UNABLE TO FIND, Take 10 drops by mouth daily. Med Name: Liquid Vitamin A, Disp: , Rfl:  .  UNABLE TO FIND, Med Name: Hydrex 2 vials daily, Disp: , Rfl:  .  UNABLE TO FIND, Med Name: Proglyco-SP 3 every other day, Disp: , Rfl:  .  UNABLE TO FIND, Med Name: Hemp Oil Complete 2 daily, Disp: , Rfl:  .  UNABLE TO FIND, Med Name: Artemisinin Complex 2 daily, Disp: , Rfl:  .  UNABLE TO FIND, Med Name: Adrena Calm 1/4 pump daily, Disp: , Rfl:  .  UNABLE TO FIND, Med Name: Immuno Plus 3 drops daily, Disp: , Rfl:  .  UNABLE TO FIND, Med Name: Rockne Menghini Glutathione 5 tsp daily, Disp: , Rfl:  .  UNABLE TO FIND, Med Name: Vitamin C Drip 20,000 once weekly, Disp: , Rfl:  .  UNABLE TO FIND, Med Name: Myrrh 3 drops daily, Disp: , Rfl:  .  UNABLE TO FIND, Med Name: Welford Roche Vite 6 per day, Disp: , Rfl:  .  UNABLE TO FIND, Med Name: Pea Protein Isolate 1 scoop per day, Disp: , Rfl:  .  UNABLE TO FIND, Med Name: Alen Blew 1 scoop per day, Disp: , Rfl:   Physical exam:  Vitals:   04/30/19 1122  BP: (!) 177/61  Pulse: (!) 58  Temp: 98.1 F (36.7 C)  TempSrc: Tympanic  Weight: 197 lb (89.4 kg)  Height: '5\' 6"'  (1.676 m)   Physical Exam Constitutional:      General: She is not in acute distress. HENT:     Head: Normocephalic and atraumatic.  Eyes:     Pupils: Pupils are equal, round, and reactive to light.  Cardiovascular:     Rate and Rhythm: Normal rate and regular rhythm.     Heart sounds: Normal heart sounds.  Pulmonary:     Effort: Pulmonary effort is normal.     Breath sounds: Normal breath sounds.  Abdominal:     General: Bowel sounds are normal.     Palpations: Abdomen is soft.  Musculoskeletal:     Cervical back: Normal range of motion.  Skin:    General: Skin is warm and dry.  Neurological:     Mental Status: She is alert and oriented to person, place, and time.   Patient is s/p left mastectomy without reconstruction and a well-healed surgical scar.  No evidence of chest wall recurrence.  No palpable masses in the right breast.  No palpable right axillary adenopathy.  CMP Latest Ref Rng & Units 04/30/2019  Glucose 70 - 99 mg/dL 97  BUN 8 - 23 mg/dL 14  Creatinine 0.44 - 1.00 mg/dL 0.70  Sodium 135 - 145 mmol/L 142  Potassium 3.5 - 5.1 mmol/L 3.3(L)  Chloride 98 - 111 mmol/L 109  CO2 22 - 32 mmol/L 25  Calcium 8.9 - 10.3 mg/dL 9.6  Total Protein 6.5 - 8.1 g/dL 7.4  Total Bilirubin 0.3 - 1.2 mg/dL 0.8  Alkaline Phos 38 - 126 U/L 74  AST 15 - 41 U/L 18  ALT 0 - 44 U/L 14   CBC Latest Ref Rng & Units 04/30/2019  WBC 4.0 - 10.5 K/uL 5.2  Hemoglobin 12.0 - 15.0 g/dL 12.9  Hematocrit 36.0 - 46.0 %  40.7  Platelets 150 - 400 K/uL 143(L)      Assessment and plan- Patient is a 69 y.o. female history of stage Ib pathological prognostic stage T2N2A grade 2 invasive mammary carcinoma with mixed ductal and lobular features ER and PR greater than 90% positive and HER-2/neu  negative status post left mastectomy and axillary lymph node dissection.She refused adjuvant chemotherapy and radiation and is currently on Arimidex.    This is a routine follow-up visit for breast cancer  Clinically patient is doing well and no concerning signs and symptoms of recurrence based on today's exam.  She is tolerating Arimidex along with calcium and vitamin D well.  She will continue that for 10 years.  I will see her back in 6 months without labs.  Patient was also found to have lung nodules and she had a CT scan in April of last year which showed that these lung nodules were overall stable over 4 months.  I will plan to get a repeat CT scan in April 2021.   Visit Diagnosis 1. Encounter for follow-up surveillance of breast cancer   2. Lung nodules   3. Visit for monitoring Arimidex therapy      Dr. Randa Evens, MD, MPH [facility] [phone] 05/02/2019 12:16 PM

## 2019-07-29 ENCOUNTER — Ambulatory Visit
Admission: RE | Admit: 2019-07-29 | Discharge: 2019-07-29 | Disposition: A | Discharge status: Home / Self Care | Payer: Medicare HMO | Source: Ambulatory Visit | Attending: Oncology | Admitting: Oncology

## 2019-07-29 ENCOUNTER — Other Ambulatory Visit: Payer: Self-pay

## 2019-07-29 DIAGNOSIS — R918 Other nonspecific abnormal finding of lung field: Secondary | ICD-10-CM | POA: Diagnosis present

## 2019-07-30 ENCOUNTER — Telehealth: Payer: Self-pay

## 2019-07-30 DIAGNOSIS — E041 Nontoxic single thyroid nodule: Secondary | ICD-10-CM

## 2019-07-30 NOTE — Telephone Encounter (Signed)
LMTCB 07/30/2019.  PEC Please advise pt of CT Scan below.    Thanks,   -Mickel Baas

## 2019-07-30 NOTE — Telephone Encounter (Signed)
-----   Message from Virginia Crews, MD sent at 07/29/2019  2:05 PM EDT ----- No problem Dr Janese Banks - we got it.  CMAs, please order Thyroid ultrasound, refer to ENT, and check TSH for thyroid nodule.  If patient has questions about this or would like to discuss, please schedule a virtual or phone visit. Thanks! ----- Message ----- From: Sindy Guadeloupe, MD Sent: 07/29/2019   1:41 PM EDT To: Virginia Crews, MD  Hello Dr. Brita Romp,  Can you please follow up on her thyroid nodule on CT? Or would you like me to order thyroid usg and refer to ENT? Thanks, Randa Evens

## 2019-07-30 NOTE — Telephone Encounter (Signed)
Reviewed CT Scan results and the physician's note/orders with the patient. She will wait to hear from ENT for an appointment and call to schedule the Thyroid Ultrasound. Will come in for TSH one day this week. Patient is good with all instructions and does not wish to have a  Virtual/phone visit to discuss the information. All questions answered at this time.

## 2019-07-31 ENCOUNTER — Telehealth: Payer: Self-pay | Admitting: *Deleted

## 2019-07-31 NOTE — Telephone Encounter (Signed)
Attempted to return call to pt.  Left vm to call office to discuss CT scan.

## 2019-07-31 NOTE — Telephone Encounter (Signed)
Patient is calling NT back to get CT results. Call back [phone]

## 2019-07-31 NOTE — Telephone Encounter (Signed)
Opened in error. Please disregard.

## 2019-08-06 ENCOUNTER — Ambulatory Visit: Admission: RE | Admit: 2019-08-06 | Payer: Medicare HMO | Source: Ambulatory Visit

## 2019-08-16 NOTE — Progress Notes (Signed)
See note by Chipper Herb, medical student, above - attested by me

## 2019-08-19 ENCOUNTER — Encounter: Payer: Self-pay | Admitting: Family Medicine

## 2019-08-19 ENCOUNTER — Ambulatory Visit (INDEPENDENT_AMBULATORY_CARE_PROVIDER_SITE_OTHER): Payer: Medicare HMO | Admitting: Family Medicine

## 2019-08-19 ENCOUNTER — Other Ambulatory Visit: Payer: Self-pay

## 2019-08-19 VITALS — BP 172/82 | HR 61 | Temp 97.3°F | Resp 16 | Ht 65.0 in | Wt 200.4 lb

## 2019-08-19 DIAGNOSIS — Z6831 Body mass index (BMI) 31.0-31.9, adult: Secondary | ICD-10-CM | POA: Diagnosis not present

## 2019-08-19 DIAGNOSIS — E041 Nontoxic single thyroid nodule: Secondary | ICD-10-CM | POA: Diagnosis not present

## 2019-08-19 DIAGNOSIS — I10 Essential (primary) hypertension: Secondary | ICD-10-CM | POA: Diagnosis not present

## 2019-08-19 DIAGNOSIS — E669 Obesity, unspecified: Secondary | ICD-10-CM

## 2019-08-19 MED ORDER — AMLODIPINE BESYLATE 10 MG PO TABS: 10.0000 mg | ORAL_TABLET | Freq: Every day | ORAL | 3 refills | Status: DC

## 2019-08-19 NOTE — Assessment & Plan Note (Signed)
Pt that is obese today with BMI of 33.35  Plan: Discussed importance of diet and exercise for reducing BMI Discussed with pt importance of healthy weight management

## 2019-08-19 NOTE — Assessment & Plan Note (Signed)
Found incidentally on CT Chest for malignancy monitoring Has upcoming appt with ENT this week Discussed that labs will likely be ordered at that visit She did miss her thyroid US, so we will work on having that rescheduled

## 2019-08-19 NOTE — Patient Instructions (Signed)
Thyroid Nodule  A thyroid nodule is an isolated growth of thyroid cells that forms a lump in your thyroid gland. The thyroid gland is a butterfly-shaped gland. It is found in the lower front of your neck. This gland sends chemical messengers (hormones) through your blood to all parts of your body. These hormones are important in regulating your body temperature and helping your body to use energy. Thyroid nodules are common. Most are not cancerous (benign). You may have one nodule or several nodules. Different types of thyroid nodules include nodules that:  Grow and fill with fluid (thyroid cysts).  Produce too much thyroid hormone (hot nodules or hyperthyroid).  Produce no thyroid hormone (cold nodules or hypothyroid).  Form from cancer cells (thyroid cancers). What are the causes? In most cases, the cause of this condition is not known. What increases the risk? The following factors may make you more likely to develop this condition.  Age. Thyroid nodules become more common in people who are older than 71 years of age.  Gender. ? Benign thyroid nodules are more common in women. ? Cancerous (malignant) thyroid nodules are more common in men.  A family history that includes: ? Thyroid nodules. ? Pheochromocytoma. ? Thyroid carcinoma. ? Hyperparathyroidism.  Certain kinds of thyroid diseases, such as Hashimoto's thyroiditis.  Lack of iodine in your diet.  A history of head and neck radiation, such as from previous cancer treatment. What are the signs or symptoms? In many cases, there are no symptoms. If you have symptoms, they may include:  A lump in your lower neck.  Feeling a lump or tickle in your throat.  Pain in your neck, jaw, or ear.  Having trouble swallowing. Hot nodules may cause symptoms that include:  Weight loss.  Warm, flushed skin.  Feeling hot.  Feeling nervous.  A racing heartbeat. Cold nodules may cause symptoms that include:  Weight gain.   Dry skin.  Brittle hair. This may also occur with hair loss.  Feeling cold.  Fatigue. Thyroid cancer nodules may cause symptoms that include:  Hard nodules that feel stuck to the thyroid gland.  Hoarseness.  Lumps in the glands near your thyroid (lymph nodes). How is this diagnosed? A thyroid nodule may be felt by your health care provider during a physical exam. This condition may also be diagnosed based on your symptoms. You may also have tests, including:  An ultrasound. This may be done to confirm the diagnosis.  A biopsy. This involves taking a sample from the nodule and looking at it under a microscope.  Blood tests to make sure that your thyroid is working properly.  A thyroid scan. This test uses a radioactive tracer injected into a vein to create an image of the thyroid gland on a computer screen.  Imaging tests such as MRI or CT scan. These may be done if: ? Your nodule is large. ? Your nodule is blocking your airway. ? Cancer is suspected. How is this treated? Treatment depends on the cause and size of your nodule or nodules. If the nodule is benign, treatment may not be necessary. Your health care provider may monitor the nodule to see if it goes away without treatment. If the nodule continues to grow, is cancerous, or does not go away, treatment may be needed. Treatment may include:  Having a cystic nodule drained with a needle.  Ablation therapy. In this treatment, alcohol is injected into the area of the nodule to destroy the cells. Ablation with heat (  thermal ablation) may also be used.  Radioactive iodine. In this treatment, radioactive iodine is given as a pill or liquid that you drink. This substance causes the thyroid nodule to shrink.  Surgery to remove the nodule. Part or all of your thyroid gland may need to be removed as well.  Medicines. Follow these instructions at home:  Pay attention to any changes in your nodule.  Take  over-the-counter and prescription medicines only as told by your health care provider.  Keep all follow-up visits as told by your health care provider. This is important. Contact a health care provider if:  Your voice changes.  You have trouble swallowing.  You have pain in your neck, ear, or jaw that is getting worse.  Your nodule gets bigger.  Your nodule starts to make it harder for you to breathe.  Your muscles look like they are shrinking (muscle wasting). Get help right away if:  You have chest pain.  There is a loss of consciousness.  You have a sudden fever.  You feel confused.  You are seeing or hearing things that other people do not see or hear (having hallucinations).  You feel very weak.  You have mood swings.  You feel very restless.  You feel suddenly nauseous or throw up.  You suddenly have diarrhea. Summary  A thyroid nodule is an isolated growth of thyroid cells that forms a lump in your thyroid gland.  Thyroid nodules are common. Most are not cancerous (benign). You may have one nodule or several nodules.  Treatment depends on the cause and size of your nodule or nodules. If the nodule is benign, treatment may not be necessary.  Your health care provider may monitor the nodule to see if it goes away without treatment. If the nodule continues to grow, is cancerous, or does not go away, treatment may be needed. This information is not intended to replace advice given to you by your health care provider. Make sure you discuss any questions you have with your health care provider. Document Revised: 11/10/2017 Document Reviewed: 11/13/2017 Elsevier Patient Education  2020 [employer]  

## 2019-08-19 NOTE — Progress Notes (Signed)
Established patient visit   Patient: Meredith Hamilton   DOB: 07-Apr-1951   69 y.o. Female  MRN: [id number] Visit Date: 08/19/2019  Today's healthcare provider: Lavon Paganini, MD   Chief Complaint  Patient presents with  . Hypertension   Subjective    HPI  Hypertension, follow-up:  BP Readings from Last 3 Encounters:  08/19/19 (!) 172/82  04/30/19 (!) 177/61  02/18/19 138/75    She was last seen for hypertension 6 months ago.  BP at that visit was 138/75 Management changes since that visit include amlodipine Pt states that she ran out 3 months ago, unable to refill. She reports excellent compliance with treatment when she did have medication She is not having side effects.  She is exercising. She is adherent to low salt diet.   Outside blood pressures are not checked. She is experiencing none.  Patient denies chest pain, chest pressure/discomfort, claudication, dyspnea, exertional chest pressure/discomfort, fatigue, irregular heart beat, lower extremity edema, near-syncope, palpitations, syncope and tachypnea.   Cardiovascular risk factors include advanced age (older than 33 for men, 18 for women), hypertension and obesity (BMI >= 30 kg/m2).  Use of agents associated with hypertension: none.     Weight trend: increasing steadily Wt Readings from Last 3 Encounters:  08/19/19 200 lb 6.4 oz (90.9 kg)  04/30/19 197 lb (89.4 kg)  02/18/19 193 lb (87.5 kg)    Current diet: in general, a "healthy" diet    ------------------------------------------------------------------------   Thyroid Nodule  TSH  Date Value Ref Range Status  02/18/2019 1.780 0.450 - 4.500 uIU/mL Final  01/16/2018 2.86 0.41 - 5.90 Final  10/02/2017 2.10 0.41 - 5.90 Final   Wt Readings from Last 3 Encounters:  08/19/19 200 lb 6.4 oz (90.9 kg)  04/30/19 197 lb (89.4 kg)  02/18/19 193 lb (87.5 kg)    Pt with finding of mild heterogeneity and enlargement of the right thyroid found on CT on  07/29/2019 Management since that visit includes referral to ENT for work-up.  Pt is seeing ENT this wednesday. She is experiencing none She denies change in energy level, diarrhea, heat / cold intolerance, nervousness, palpitations and weight changes Weight trend: increasing steadily  ------------------------------------------------------------------------      Social History   Tobacco Use  . Smoking status: Never Smoker  . Smokeless tobacco: Never Used  Substance Use Topics  . Alcohol use: Not Currently  . Drug use: Never   Social History   Socioeconomic History  . Marital status: Divorced    Spouse name: Not on file  . Number of children: 2  . Years of education: 38  . Highest education level: High school graduate  Occupational History  . Occupation: retired Sales promotion account executive for [employer]  Tobacco Use  . Smoking status: Never Smoker  . Smokeless tobacco: Never Used  Substance and Sexual Activity  . Alcohol use: Not Currently  . Drug use: Never  . Sexual activity: Not Currently  Other Topics Concern  . Not on file  Social History Narrative  . Not on file   Social Determinants of Health   Financial Resource Strain: Low Risk   . Difficulty of Paying Living Expenses: Not very hard  Food Insecurity:   . Worried About Charity fundraiser in the Last Year:   . Arboriculturist in the Last Year:   Transportation Needs:   . Film/video editor (Medical):   Marland Kitchen Lack of Transportation (Non-Medical):   Physical Activity: Inactive  .  Days of Exercise per Week: 0 days  . Minutes of Exercise per Session: 0 min  Stress:   . Feeling of Stress :   Social Connections:   . Frequency of Communication with Friends and Family:   . Frequency of Social Gatherings with Friends and Family:   . Attends Religious Services:   . Active Member of Clubs or Organizations:   . Attends Archivist Meetings:   Marland Kitchen Marital Status:   Intimate Partner Violence:   . Fear of  Current or Ex-Partner:   . Emotionally Abused:   Marland Kitchen Physically Abused:   . Sexually Abused:        Medications: Outpatient Medications Prior to Visit  Medication Sig  . anastrozole (ARIMIDEX) 1 MG tablet TAKE 1 TABLET(1 MG) BY MOUTH DAILY  . Cholecalciferol (VITAMIN D3) LIQD Take 6 drops by mouth daily.  . Homeopathic Products (FRANKINCENSE UPLIFTING) OIL Take 7 drops by mouth daily.  . Misc Natural Products (SPLEEN) CAPS Take 2 capsules by mouth daily.  Marland Kitchen OVER THE COUNTER MEDICATION Take 4 Scoops by mouth daily. Pecta Sol-C (Mod. Citrus Pectin)  . OVER THE COUNTER MEDICATION Take 2 Scoops by mouth daily. Fermented Mushroom Blend  . OVER THE COUNTER MEDICATION Take 6 capsules by mouth daily. Cruciferous Complete  . OVER THE COUNTER MEDICATION Nitric Balance 4 tsp daily  . Specialty Vitamins Products (MAMMARY) TABS Take 2 tablets by mouth daily.  . TURMERIC PO Take 7 drops by mouth daily.  Marland Kitchen UNABLE TO FIND Take 6 tablets by mouth daily. Med Name: Vitamin B 17  . UNABLE TO FIND Take 10 drops by mouth daily. Med Name: Liquid Vitamin A  . UNABLE TO FIND Med Name: Hydrex 2 vials daily  . UNABLE TO FIND Med Name: [id number] every other day  . UNABLE TO FIND Med Name: Hemp Oil Complete 2 daily  . UNABLE TO FIND Med Name: Artemisinin Complex 2 daily  . UNABLE TO FIND Med Name: Adrena Calm 1/4 pump daily  . UNABLE TO FIND Med Name: Immuno Plus 3 drops daily  . UNABLE TO FIND Med Name: Rockne Menghini Glutathione 5 tsp daily  . UNABLE TO FIND Med Name: Vitamin C Drip 20,000 once weekly  . UNABLE TO FIND Med Name: Myrrh 3 drops daily  . UNABLE TO FIND Med Name: Welford Roche Vite 6 per day  . UNABLE TO FIND Med Name: Pea Protein Isolate 1 scoop per day  . UNABLE TO FIND Med Name: Alen Blew 1 scoop per day  . amLODipine (NORVASC) 10 MG tablet Take 1 tablet (10 mg total) by mouth daily. (Patient not taking: Reported on 08/19/2019)   No facility-administered medications prior to visit.    Review of  Systems  Constitutional: Negative.   HENT: Negative.   Eyes: Negative.   Respiratory: Negative.   Cardiovascular: Negative.   Gastrointestinal: Negative.   Endocrine: Negative.   Genitourinary: Negative.   Musculoskeletal: Negative.   Skin: Negative.   Allergic/Immunologic: Negative.   Neurological: Negative.   Hematological: Negative.   Psychiatric/Behavioral: Negative.     Last CBC Lab Results  Component Value Date   WBC 5.2 04/30/2019   HGB 12.9 04/30/2019   HCT 40.7 04/30/2019   MCV 97.1 04/30/2019   MCH 30.8 04/30/2019   RDW 12.8 04/30/2019   PLT 143 (L) 0000000   Last metabolic panel Lab Results  Component Value Date   GLUCOSE 97 04/30/2019   NA 142 04/30/2019   K 3.3 (L) 04/30/2019  CL 109 04/30/2019   CO2 25 04/30/2019   BUN 14 04/30/2019   CREATININE 0.70 04/30/2019   GFRNONAA >60 04/30/2019   GFRAA >60 04/30/2019   CALCIUM 9.6 04/30/2019   PHOS 3.9 01/16/2018   PROT 7.4 04/30/2019   ALBUMIN 4.6 04/30/2019   LABGLOB 2.0 01/16/2018   BILITOT 0.8 04/30/2019   ALKPHOS 74 04/30/2019   AST 18 04/30/2019   ALT 14 04/30/2019   ANIONGAP 8 04/30/2019   Last lipids Lab Results  Component Value Date   CHOL 162 02/18/2019   HDL 61 02/18/2019   LDLCALC 73 02/18/2019   TRIG 167 (H) 02/18/2019   CHOLHDL 2.7 02/18/2019   Last hemoglobin A1c Lab Results  Component Value Date   HGBA1C 5.0 01/16/2018   Last thyroid functions Lab Results  Component Value Date   TSH 1.780 02/18/2019   T3TOTAL 104 01/16/2018   Last vitamin D Lab Results  Component Value Date   VD25OH 55.7 01/16/2018   Last vitamin B12 and Folate Lab Results  Component Value Date   VITAMINB12 200 10/02/2017      Objective    BP (!) 172/82 (BP Location: Right Arm, Patient Position: Sitting, Cuff Size: Large)   Pulse 61   Temp (!) 97.3 F (36.3 C) (Temporal)   Resp 16   Ht 5\' 5"  (1.651 m)   Wt 200 lb 6.4 oz (90.9 kg)   BMI 33.35 kg/m  BP Readings from Last 3 Encounters:   08/19/19 (!) 172/82  04/30/19 (!) 177/61  02/18/19 138/75   Wt Readings from Last 3 Encounters:  08/19/19 200 lb 6.4 oz (90.9 kg)  04/30/19 197 lb (89.4 kg)  02/18/19 193 lb (87.5 kg)      Physical Exam Constitutional:      Appearance: Normal appearance. She is obese.  HENT:     Head: Normocephalic and atraumatic.  Neck:     Thyroid: No thyroid mass, thyromegaly or thyroid tenderness.     Trachea: Trachea normal. No tracheal tenderness or tracheal deviation.  Cardiovascular:     Rate and Rhythm: Normal rate and regular rhythm.     Pulses: Normal pulses.     Heart sounds: Normal heart sounds.  Pulmonary:     Effort: Pulmonary effort is normal.     Breath sounds: Normal breath sounds.  Abdominal:     Palpations: Abdomen is soft.  Musculoskeletal:     Cervical back: Neck supple. No edema, erythema or rigidity. No spinous process tenderness or muscular tenderness.     Right lower leg: No edema.     Left lower leg: No edema.  Lymphadenopathy:     Cervical: No cervical adenopathy.  Skin:    General: Skin is warm and dry.  Neurological:     General: No focal deficit present.     Mental Status: She is alert and oriented to person, place, and time.  Psychiatric:        Mood and Affect: Mood normal.        Behavior: Behavior normal.      No results found for any visits on 08/19/19.  Assessment & Plan     Problem List Items Addressed This Visit      Cardiovascular and Mediastinum   Essential (primary) hypertension - Primary    Pt with a history of hypertension well controlled on medication Pt stopped taking medication 3 months ago; unable to refill BP today is 172/82 and uncontrolled  Plan: Labs are followed closely by oncology, no  labs are indicated at this visit today Will refill and restart amlodipine Discussed with pt the importance of taking medication daily for chronic HTN Discussed with pt to call the office if she has any problems refilling medication  Discussed the importance of diet and exercise to decrease blood pressure Will follow up in 1 month to recheck blood pressure after restarting medication         Relevant Medications   amLODipine (NORVASC) 10 MG tablet     Other   Obesity    Pt that is obese today with BMI of 33.35  Plan: Discussed importance of diet and exercise for reducing BMI Discussed with pt importance of healthy weight management        Other Visit Diagnoses    Thyroid nodule       Relevant Orders   US THYROID     3. Thyroid nodule Pt with finding of right thyroid enlargement on CT Pt is asymptomatic  Plan: Continue to follow up with ENT Will order thyroid US Will defer lab work to ENT Will continue to monitor pt for symptoms of thyroid dysfunction     Return in about 1 month (around 09/19/2019) for Follow up on Blood Pressure.      Chipper Herb, Medical Student [facility] [phone] (phone) [phone] (fax)  [place]

## 2019-08-19 NOTE — Assessment & Plan Note (Addendum)
Pt with a history of hypertension well controlled on medication Pt stopped taking medication 3 months ago; unable to refill BP today is 172/82 and uncontrolled She is asymptomatic with no red flags or signs of hypertensive urgency/emergency  Plan: Labs are followed closely by oncology, no labs are indicated at this visit today Will refill and restart amlodipine Discussed with pt the importance of taking medication daily for chronic HTN Discussed with pt to call the office if she has any problems refilling medication Discussed the importance of diet and exercise to decrease blood pressure Will follow up in 1 month to recheck blood pressure after restarting medication

## 2019-08-23 ENCOUNTER — Telehealth: Payer: Self-pay

## 2019-08-23 ENCOUNTER — Ambulatory Visit
Admission: RE | Admit: 2019-08-23 | Discharge: 2019-08-23 | Disposition: A | Discharge status: Home / Self Care | Payer: Medicare HMO | Source: Ambulatory Visit | Attending: Family Medicine | Admitting: Family Medicine

## 2019-08-23 ENCOUNTER — Other Ambulatory Visit: Payer: Self-pay

## 2019-08-23 DIAGNOSIS — E041 Nontoxic single thyroid nodule: Secondary | ICD-10-CM | POA: Diagnosis not present

## 2019-08-23 NOTE — Telephone Encounter (Signed)
-----   Message from Virginia Crews, MD sent at 08/23/2019  3:41 PM EDT ----- Multiple thyroid nodules but none meeting criteria for biopsy.  Should see/or have been seen by ENT for this as scheduled.

## 2019-08-23 NOTE — Telephone Encounter (Signed)
Patient returned call, advised of the results as noted below by Dr. Brita Romp on 08/23/18, she verbalized understanding.

## 2019-08-23 NOTE — Telephone Encounter (Signed)
LMTCB, PEC may give result.  

## 2019-09-27 NOTE — Progress Notes (Signed)
I,Laura E Walsh,acting as a scribe for Lavon Paganini, MD.,have documented all relevant documentation on the behalf of Lavon Paganini, MD,as directed by  Lavon Paganini, MD while in the presence of Lavon Paganini, MD.  Established patient visit   Patient: Meredith Hamilton   DOB: 09/07/1950   69 y.o. Female  MRN: [id number] Visit Date: 09/30/2019  Today's healthcare provider: Lavon Paganini, MD   Chief Complaint  Patient presents with  . Hypertension   Subjective    HPI   Hypertension, follow-up  BP Readings from Last 3 Encounters:  09/30/19 134/72  08/19/19 (!) 172/82  04/30/19 (!) 177/61   Wt Readings from Last 3 Encounters:  09/30/19 198 lb (89.8 kg)  08/19/19 200 lb 6.4 oz (90.9 kg)  04/30/19 197 lb (89.4 kg)     She was last seen for hypertension 1 months ago.  BP at that visit was 172/82. Management since that visit includes restarting amlodipine 10mg  a day.  She reports excellent compliance with treatment. She is not having side effects.  She is following a Low Sodium diet. She is exercising. She does not smoke.  Use of agents associated with hypertension: none.   Outside blood pressures are checked occasionally at the drug store.  Symptoms: No chest pain No chest pressure  No palpitations No syncope  No dyspnea No orthopnea  No paroxysmal nocturnal dyspnea No lower extremity edema   Pertinent labs: Lab Results  Component Value Date   CHOL 162 02/18/2019   HDL 61 02/18/2019   LDLCALC 73 02/18/2019   TRIG 167 (H) 02/18/2019   CHOLHDL 2.7 02/18/2019   Lab Results  Component Value Date   NA 142 04/30/2019   K 3.3 (L) 04/30/2019   CREATININE 0.70 04/30/2019   GFRNONAA >60 04/30/2019   GFRAA >60 04/30/2019   GLUCOSE 97 04/30/2019     The 10-year ASCVD risk score Mikey Bussing DC Jr., et al., 2013) is: 14.2%   ---------------------------------------------------------------------------------------------------    Patient Active Problem List    Diagnosis Date Noted  . Thyroid nodule 08/19/2019  . Left wrist pain 02/18/2019  . Genetic testing 05/24/2018  . Family history of breast cancer   . Family history of throat cancer   . Family history of lung cancer   . Status post mastectomy, left 11/08/2017  . Malignant neoplasm of overlapping sites of left breast in female, estrogen receptor positive ([place]) 10/27/2017  . Anxiety 07/22/2017  . H/O Hashimoto thyroiditis 07/22/2017  . 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 Essential (primary) hypertension 11/08/2004  . Obesity 11/08/2004   Past Medical History:  Diagnosis Date  . Breast CA ([place])   . Family history of breast cancer   . Family history of lung cancer   . Family history of throat cancer   . Hashimoto's disease   . Hypertension   . Non-celiac gluten sensitivity    Social History   Tobacco Use  . Smoking status: Never Smoker  . Smokeless tobacco: Never Used  Vaping Use  . Vaping Use: Never used  Substance Use Topics  . Alcohol use: Not Currently  . Drug use: Never   Allergies  Allergen Reactions  . Sulfa Antibiotics Swelling  . Tramadol Other (See Comments)    Other reaction(s): Hallucination     Medications: Outpatient Medications Prior to Visit  Medication Sig  . amLODipine (NORVASC) 10 MG tablet Take 1 tablet (10 mg total) by mouth daily.  Marland Kitchen anastrozole (ARIMIDEX) 1 MG tablet TAKE  1 TABLET(1 MG) BY MOUTH DAILY  . Cholecalciferol (VITAMIN D3) LIQD Take 6 drops by mouth daily.  . Homeopathic Products (FRANKINCENSE UPLIFTING) OIL Take 7 drops by mouth daily.  . Misc Natural Products (SPLEEN) CAPS Take 2 capsules by mouth daily.  Marland Kitchen OVER THE COUNTER MEDICATION Take 4 Scoops by mouth daily. Pecta Sol-C (Mod. Citrus Pectin)  . OVER THE COUNTER MEDICATION Take 2 Scoops by mouth daily. Fermented Mushroom Blend  . OVER THE COUNTER MEDICATION Take 6 capsules by mouth daily. Cruciferous Complete  . OVER THE COUNTER MEDICATION Nitric Balance 4  tsp daily  . Specialty Vitamins Products (MAMMARY) TABS Take 2 tablets by mouth daily.  . TURMERIC PO Take 7 drops by mouth daily.  Marland Kitchen UNABLE TO FIND Take 6 tablets by mouth daily. Med Name: Vitamin B 17  . UNABLE TO FIND Take 10 drops by mouth daily. Med Name: Liquid Vitamin A  . UNABLE TO FIND Med Name: Hydrex 2 vials daily  . UNABLE TO FIND Med Name: [id number] 3 every other day  . UNABLE TO FIND Med Name: Hemp Oil Complete 2 daily  . UNABLE TO FIND Med Name: Artemisinin Complex 2 daily  . UNABLE TO FIND Med Name: Adrena Calm 1/4 pump daily  . UNABLE TO FIND Med Name: Immuno Plus 3 drops daily  . UNABLE TO FIND Med Name: Rockne Menghini Glutathione 5 tsp daily  . UNABLE TO FIND Med Name: Vitamin C Drip 20,000 once weekly  . UNABLE TO FIND Med Name: Myrrh 3 drops daily  . UNABLE TO FIND Med Name: Welford Roche Vite 6 per day  . UNABLE TO FIND Med Name: Pea Protein Isolate 1 scoop per day  . UNABLE TO FIND Med Name: Alen Blew 1 scoop per day   No facility-administered medications prior to visit.    Review of Systems  Constitutional: Negative.   Respiratory: Negative.   Cardiovascular: Negative.   Gastrointestinal: Negative.   Neurological: Negative for dizziness, light-headedness and headaches.      Objective    BP 134/72 (BP Location: Right Arm, Patient Position: Sitting, Cuff Size: Large)   Pulse 60   Temp (!) 96.9 F (36.1 C) (Temporal)   Wt 198 lb (89.8 kg)   SpO2 96%   BMI 32.95 kg/m  BP Readings from Last 3 Encounters:  09/30/19 134/72  08/19/19 (!) 172/82  04/30/19 (!) 177/61    Physical Exam Constitutional:      Appearance: Normal appearance.  Eyes:     Conjunctiva/sclera: Conjunctivae normal.  Cardiovascular:     Rate and Rhythm: Normal rate and regular rhythm.     Pulses: Normal pulses.     Heart sounds: Normal heart sounds.  Pulmonary:     Effort: Pulmonary effort is normal.     Breath sounds: Normal breath sounds.  Abdominal:     Tenderness: There is no  abdominal tenderness.  Musculoskeletal:     Right lower leg: Edema (Trace edema) present.     Left lower leg: Edema (Trace edema) present.  Skin:    General: Skin is warm and dry.  Neurological:     Mental Status: She is alert.     No results found for any visits on 09/30/19.  Assessment & Plan     Problem List Items Addressed This Visit      Cardiovascular and Mediastinum   Essential (primary) hypertension - Primary    Well controlled with restarting Amlodipine 10 mg  Continue current medications Review metabolic panel F/u in  6 months           Return in about 6 months (around 03/31/2020) for AWV and Hypertension.      I, Lavon Paganini, MD, have reviewed all documentation for this visit. The documentation on 09/30/19 for the exam, diagnosis, procedures, and orders are all accurate and complete.   Holley Kocurek, Dionne Bucy, MD, MPH [facility]

## 2019-09-30 ENCOUNTER — Other Ambulatory Visit: Payer: Self-pay

## 2019-09-30 ENCOUNTER — Encounter: Payer: Self-pay | Admitting: Family Medicine

## 2019-09-30 ENCOUNTER — Ambulatory Visit (INDEPENDENT_AMBULATORY_CARE_PROVIDER_SITE_OTHER): Payer: Medicare HMO | Admitting: Family Medicine

## 2019-09-30 VITALS — BP 134/72 | HR 60 | Temp 96.9°F | Wt 198.0 lb

## 2019-09-30 DIAGNOSIS — I10 Essential (primary) hypertension: Secondary | ICD-10-CM

## 2019-09-30 NOTE — Patient Instructions (Signed)

## 2019-09-30 NOTE — Assessment & Plan Note (Signed)
Well controlled with restarting Amlodipine 10 mg  Continue current medications Review metabolic panel F/u in 6 months

## 2019-10-29 ENCOUNTER — Ambulatory Visit: Payer: Medicare HMO | Admitting: Oncology

## 2019-11-22 ENCOUNTER — Inpatient Hospital Stay: Payer: Medicare HMO | Admitting: Oncology

## 2019-12-02 ENCOUNTER — Encounter: Payer: Self-pay | Admitting: Oncology

## 2019-12-02 ENCOUNTER — Inpatient Hospital Stay: Payer: Medicare HMO | Attending: Oncology | Admitting: Oncology

## 2019-12-02 ENCOUNTER — Inpatient Hospital Stay: Payer: Medicare HMO | Admitting: Oncology

## 2019-12-02 ENCOUNTER — Other Ambulatory Visit: Payer: Self-pay

## 2019-12-02 VITALS — BP 123/65 | HR 55 | Temp 96.3°F | Resp 16 | Wt 200.7 lb

## 2019-12-02 DIAGNOSIS — Z801 Family history of malignant neoplasm of trachea, bronchus and lung: Secondary | ICD-10-CM | POA: Insufficient documentation

## 2019-12-02 DIAGNOSIS — Z803 Family history of malignant neoplasm of breast: Secondary | ICD-10-CM | POA: Diagnosis not present

## 2019-12-02 DIAGNOSIS — M85851 Other specified disorders of bone density and structure, right thigh: Secondary | ICD-10-CM | POA: Insufficient documentation

## 2019-12-02 DIAGNOSIS — Z8049 Family history of malignant neoplasm of other genital organs: Secondary | ICD-10-CM | POA: Insufficient documentation

## 2019-12-02 DIAGNOSIS — Z853 Personal history of malignant neoplasm of breast: Secondary | ICD-10-CM | POA: Diagnosis not present

## 2019-12-02 DIAGNOSIS — Z5181 Encounter for therapeutic drug level monitoring: Secondary | ICD-10-CM

## 2019-12-02 DIAGNOSIS — Z79811 Long term (current) use of aromatase inhibitors: Secondary | ICD-10-CM | POA: Diagnosis not present

## 2019-12-02 DIAGNOSIS — Z170 Estrogen receptor positive status [ER+]: Secondary | ICD-10-CM | POA: Insufficient documentation

## 2019-12-02 DIAGNOSIS — Z90721 Acquired absence of ovaries, unilateral: Secondary | ICD-10-CM | POA: Insufficient documentation

## 2019-12-02 DIAGNOSIS — C50912 Malignant neoplasm of unspecified site of left female breast: Secondary | ICD-10-CM | POA: Insufficient documentation

## 2019-12-02 DIAGNOSIS — Z9012 Acquired absence of left breast and nipple: Secondary | ICD-10-CM | POA: Insufficient documentation

## 2019-12-02 DIAGNOSIS — Z79899 Other long term (current) drug therapy: Secondary | ICD-10-CM | POA: Insufficient documentation

## 2019-12-02 DIAGNOSIS — Z08 Encounter for follow-up examination after completed treatment for malignant neoplasm: Secondary | ICD-10-CM

## 2019-12-02 NOTE — Progress Notes (Signed)
Hematology/Oncology Consult note [facility]  Telephone:([phone] [phone]  Patient Care Team: Virginia Crews, MD as PCP - General (Family Medicine) Johny Blamer, [place] (Chiropractic Medicine) Debbora Dus, NP (Nurse Practitioner)   Name of the patient: Meredith Hamilton  [id number]  1950-04-20   Date of visit: 12/02/19  Diagnosis- h/o left breast cancer pathological prognostic stage IB T2N2M0 ER PR positive her 2 negative s/p mastectomy and axillary LN dissection   Chief complaint/ Reason for visit- Routine follow-up of breast cancer  Heme/Onc history: Patient is a 69 year old female who was found to have an abnormal screening mammogram at an outside facility in [place] in February 2019. This was followed by a mastectomy and a sentinel lymph node biopsy in April 2019. Intraoperative frozen section of the lymph node was positive and this was converted to an axillary lymph node dissection. Final pathology revealed mixed pathology of invasive mammary as well as invasive lobular carcinoma. There were 2 foci measuring 2.1 and 0.8 cm. Overall grade 2. ER PR strongly positive greater than 90% and HER-2 was negative. 5 out of 15 axillary lymph nodes were positive for metastatic disease with the largest deposit being 1.8 cm and extranodal extension was present. Perineural invasion was identified. No LV I. Final margins were clear pathological stage T2N2A. She was recommended adjuvant chemotherapy and postmastectomy radiation. However patient did not want to go through it and eventually moved her care to [facility]. She has also been seen by surgical oncology in [facility] recently in July 2019. She has been pursuing alternative therapy with vitamin C infusions as well as other nutritional supplements.  Patient was started on Arimidex in November 2019   Interval history-patient currently reports feeling well and denies any complaints at  this time.  Appetite and weight have remained stable  ECOG PS- 0 Pain scale- 0   Review of systems- Review of Systems  Constitutional: Negative for chills, fever, malaise/fatigue and weight loss.  HENT: Negative for congestion, ear discharge and nosebleeds.   Eyes: Negative for blurred vision.  Respiratory: Negative for cough, hemoptysis, sputum production, shortness of breath and wheezing.   Cardiovascular: Negative for chest pain, palpitations, orthopnea and claudication.  Gastrointestinal: Negative for abdominal pain, blood in stool, constipation, diarrhea, heartburn, melena, nausea and vomiting.  Genitourinary: Negative for dysuria, flank pain, frequency, hematuria and urgency.  Musculoskeletal: Negative for back pain, joint pain and myalgias.  Skin: Negative for rash.  Neurological: Negative for dizziness, tingling, focal weakness, seizures, weakness and headaches.  Endo/Heme/Allergies: Does not bruise/bleed easily.  Psychiatric/Behavioral: Negative for depression and suicidal ideas. The patient does not have insomnia.       Allergies  Allergen Reactions  . Sulfa Antibiotics Swelling  . Tramadol Other (See Comments)    Other reaction(s): Hallucination     Past Medical History:  Diagnosis Date  . Breast CA ([place])   . Family history of breast cancer   . Family history of lung cancer   . Family history of throat cancer   . Hashimoto's disease   . Hypertension   . Non-celiac gluten sensitivity      Past Surgical History:  Procedure Laterality Date  . APPENDECTOMY  1979  . [place]  . MASTECTOMY W/ SENTINEL NODE BIOPSY Left 2019  . RIGHT OOPHORECTOMY  1979   due to ovarian torsion  . URETEROSCOPY Right 2017    Social History   Socioeconomic History  . Marital status: Divorced  Spouse name: Not on file  . Number of children: 2  . Years of education: 78  . Highest education level: High school graduate  Occupational History  . Occupation:  retired Sales promotion account executive for [employer]  Tobacco Use  . Smoking status: Never Smoker  . Smokeless tobacco: Never Used  Vaping Use  . Vaping Use: Never used  Substance and Sexual Activity  . Alcohol use: Not Currently  . Drug use: Never  . Sexual activity: Not Currently  Other Topics Concern  . Not on file  Social History Narrative  . Not on file   Social Determinants of Health   Financial Resource Strain: Low Risk   . Difficulty of Paying Living Expenses: Not very hard  Food Insecurity:   . Worried About Charity fundraiser in the Last Year: Not on file  . Ran Out of Food in the Last Year: Not on file  Transportation Needs:   . Lack of Transportation (Medical): Not on file  . Lack of Transportation (Non-Medical): Not on file  Physical Activity: Inactive  . Days of Exercise per Week: 0 days  . Minutes of Exercise per Session: 0 min  Stress:   . Feeling of Stress : Not on file  Social Connections:   . Frequency of Communication with Friends and Family: Not on file  . Frequency of Social Gatherings with Friends and Family: Not on file  . Attends Religious Services: Not on file  . Active Member of Clubs or Organizations: Not on file  . Attends Archivist Meetings: Not on file  . Marital Status: Not on file  Intimate Partner Violence:   . Fear of Current or Ex-Partner: Not on file  . Emotionally Abused: Not on file  . Physically Abused: Not on file  . Sexually Abused: Not on file    Family History  Problem Relation Age of Onset  . Lung cancer Mother 51       hx smoking  . Lung cancer Father 57       hx smoking  . Breast cancer Sister 29       in remission, now in mid 32s  . Lung cancer Brother 11       smoker  . Throat cancer Brother        alcohol  . Colon cancer Neg Hx   . Cervical cancer Neg Hx   . Ovarian cancer Neg Hx      Current Outpatient Medications:  .  amLODipine (NORVASC) 10 MG tablet, Take 1 tablet (10 mg total) by mouth daily., Disp:  90 tablet, Rfl: 3 .  anastrozole (ARIMIDEX) 1 MG tablet, TAKE 1 TABLET(1 MG) BY MOUTH DAILY, Disp: 90 tablet, Rfl: 3 .  Cholecalciferol (VITAMIN D3) LIQD, Take 6 drops by mouth daily., Disp: , Rfl:  .  Homeopathic Products (FRANKINCENSE UPLIFTING) OIL, Take 7 drops by mouth daily., Disp: , Rfl:  .  Misc Natural Products (SPLEEN) CAPS, Take 2 capsules by mouth daily., Disp: , Rfl:  .  OVER THE COUNTER MEDICATION, Take 4 Scoops by mouth daily. Pecta Sol-C (Mod. Citrus Pectin), Disp: , Rfl:  .  OVER THE COUNTER MEDICATION, Take 2 Scoops by mouth daily. Fermented Mushroom Blend, Disp: , Rfl:  .  OVER THE COUNTER MEDICATION, Take 6 capsules by mouth daily. Cruciferous Complete, Disp: , Rfl:  .  OVER THE COUNTER MEDICATION, Nitric Balance 4 tsp daily, Disp: , Rfl:  .  Specialty Vitamins Products (MAMMARY) TABS, Take 2 tablets  by mouth daily., Disp: , Rfl:  .  TURMERIC PO, Take 7 drops by mouth daily., Disp: , Rfl:  .  UNABLE TO FIND, Take 6 tablets by mouth daily. Med Name: Vitamin B 17, Disp: , Rfl:  .  UNABLE TO FIND, Take 10 drops by mouth daily. Med Name: Liquid Vitamin A, Disp: , Rfl:  .  UNABLE TO FIND, Med Name: Hydrex 2 vials daily, Disp: , Rfl:  .  UNABLE TO FIND, Med Name: Proglyco-SP 3 every other day, Disp: , Rfl:  .  UNABLE TO FIND, Med Name: Hemp Oil Complete 2 daily, Disp: , Rfl:  .  UNABLE TO FIND, Med Name: Artemisinin Complex 2 daily, Disp: , Rfl:  .  UNABLE TO FIND, Med Name: Adrena Calm 1/4 pump daily, Disp: , Rfl:  .  UNABLE TO FIND, Med Name: Immuno Plus 3 drops daily, Disp: , Rfl:  .  UNABLE TO FIND, Med Name: Rockne Menghini Glutathione 5 tsp daily, Disp: , Rfl:  .  UNABLE TO FIND, Med Name: Vitamin C Drip 20,000 once weekly, Disp: , Rfl:  .  UNABLE TO FIND, Med Name: Myrrh 3 drops daily, Disp: , Rfl:  .  UNABLE TO FIND, Med Name: Welford Roche Vite 6 per day, Disp: , Rfl:  .  UNABLE TO FIND, Med Name: Pea Protein Isolate 1 scoop per day, Disp: , Rfl:  .  UNABLE TO FIND, Med Name: Alen Blew 1 scoop per day, Disp: , Rfl:   Physical exam:  Vitals:   12/02/19 0858  BP: 123/65  Pulse: (!) 55  Resp: 16  Temp: (!) 96.3 F (35.7 C)  TempSrc: Tympanic  SpO2: 99%  Weight: 200 lb 11.2 oz (91 kg)   Physical Exam HENT:     Head: Normocephalic and atraumatic.  Eyes:     Pupils: Pupils are equal, round, and reactive to light.  Cardiovascular:     Rate and Rhythm: Normal rate and regular rhythm.     Heart sounds: Normal heart sounds.  Pulmonary:     Effort: Pulmonary effort is normal.     Breath sounds: Normal breath sounds.  Abdominal:     General: Bowel sounds are normal.     Palpations: Abdomen is soft.  Musculoskeletal:     Cervical back: Normal range of motion.  Skin:    General: Skin is warm and dry.  Neurological:     Mental Status: She is alert and oriented to person, place, and time.    Breast: Patient is s/p left mastectomy without reconstruction.  No evidence of chest wall recurrence.  No palpable bilateral axillary adenopathy.  No palpable masses in the right breast.  CMP Latest Ref Rng & Units 04/30/2019  Glucose 70 - 99 mg/dL 97  BUN 8 - 23 mg/dL 14  Creatinine 0.44 - 1.00 mg/dL 0.70  Sodium 135 - 145 mmol/L 142  Potassium 3.5 - 5.1 mmol/L 3.3(L)  Chloride 98 - 111 mmol/L 109  CO2 22 - 32 mmol/L 25  Calcium 8.9 - 10.3 mg/dL 9.6  Total Protein 6.5 - 8.1 g/dL 7.4  Total Bilirubin 0.3 - 1.2 mg/dL 0.8  Alkaline Phos 38 - 126 U/L 74  AST 15 - 41 U/L 18  ALT 0 - 44 U/L 14   CBC Latest Ref Rng & Units 04/30/2019  WBC 4.0 - 10.5 K/uL 5.2  Hemoglobin 12.0 - 15.0 g/dL 12.9  Hematocrit 36 - 46 % 40.7  Platelets 150 - 400 K/uL 143(L)  Assessment and plan- Patient is a 69 y.o. female with history of stage Ib pathological prognostic stage T2N2A grade 2 invasive mammary carcinoma with mixed ductal and lobular features ER and PR greater than 90% positive and HER-2/neu negative status post left mastectomy and axillary lymph node dissection.She refused  adjuvant chemotherapy and radiation and is currently on Arimidex. She is here for routine follow-up of breast cancer  Clinically patient is doing well tolerating Arimidex along with calcium and vitamin D well without any significant side effects.Patient's last bone density scan was done in January 2020 which showed osteopenia at the right femur.  Repeat bone density scan in 6 months time.  Mammogram from February 2021 did not reveal any evidence of malignancy  Lung nodules: No change in size based on CT scan in April 2021.  Likely to be benign and do not require any further follow-up.  Have encouraged the patient to take her Covid vaccine   Visit Diagnosis 1. Encounter for follow-up surveillance of breast cancer   2. Visit for monitoring Arimidex therapy      Dr. Randa Evens, MD, MPH [facility] [phone] 12/02/2019 9:51 AM

## 2020-01-30 ENCOUNTER — Other Ambulatory Visit: Payer: Self-pay | Admitting: Oncology

## 2020-02-17 ENCOUNTER — Telehealth: Payer: Self-pay

## 2020-02-17 NOTE — Telephone Encounter (Signed)
Dr. B does not have anything until after the first of the year.  I advised pt we could schedule her with another provider if she needed her AWV before the end of the year.  Pt agreed to see Sharyn Lull.  Apt scheduled for 02/28/2020 starting at 1:20 with [facility] and 2pm with [facility].   Thanks,   -Mickel Baas

## 2020-02-17 NOTE — Telephone Encounter (Signed)
Copied from [place] [phone]. Topic: Appointment Scheduling - Scheduling Inquiry for Clinic >> Feb 17, 2020  1:10 PM Meredith Hamilton wrote: Reason for CRM:Pt had to cancel her awv and cpe that was schedule for 02-20-2020. Pt would like to have awv and cpe by end of dec

## 2020-02-20 ENCOUNTER — Encounter: Payer: Medicare HMO | Admitting: Family Medicine

## 2020-02-26 NOTE — Progress Notes (Signed)
Subjective:   Meredith Hamilton is a 69 y.o. female who presents for Medicare Annual (Subsequent) preventive examination.  I connected with Meredith Hamilton today by telephone and verified that I am speaking with the correct person using two identifiers. Location patient: home Location provider: work Persons participating in the virtual visit: patient, provider.   I discussed the limitations, risks, security and privacy concerns of performing an evaluation and management service by telephone and the availability of in person appointments. I also discussed with the patient that there may be a patient responsible charge related to this service. The patient expressed understanding and verbally consented to this telephonic visit.    Interactive audio and video telecommunications were attempted between this provider and patient, however failed, due to patient having technical difficulties OR patient did not have access to video capability.  We continued and completed visit with audio only.   Review of Systems    N/A  Cardiac Risk Factors include: advanced age (>15men, >52 women);obesity (BMI >30kg/m2);hypertension     Objective:    Today's Vitals   02/27/20 0900  PainSc: 4    There is no height or weight on file to calculate BMI.  Advanced Directives 02/27/2020 12/02/2019 04/29/2019 02/18/2019 04/23/2018 02/23/2018 02/23/2018  Does Patient Have a Medical Advance Directive? No No No No No No No  Would patient like information on creating a medical advance directive? No - Patient declined - No - Patient declined No - Patient declined No - Patient declined No - Patient declined No - Patient declined    Current Medications (verified) Outpatient Encounter Medications as of 02/27/2020  Medication Sig  . amLODipine (NORVASC) 10 MG tablet Take 1 tablet (10 mg total) by mouth daily.  Marland Kitchen anastrozole (ARIMIDEX) 1 MG tablet TAKE 1 TABLET(1 MG) BY MOUTH DAILY  . Cholecalciferol (VITAMIN D3) LIQD Take 6 drops by  mouth daily.  . Homeopathic Products (FRANKINCENSE UPLIFTING) OIL Take 7 drops by mouth daily.  . Misc Natural Products (SPLEEN) CAPS Take 2 capsules by mouth daily.  Marland Kitchen OVER THE COUNTER MEDICATION Take 4 Scoops by mouth daily. Pecta Sol-C (Mod. Citrus Pectin)  . OVER THE COUNTER MEDICATION Take 2 Scoops by mouth daily. Fermented Mushroom Blend  . OVER THE COUNTER MEDICATION Take 6 capsules by mouth daily. Cruciferous Complete  . OVER THE COUNTER MEDICATION Nitric Balance 4 tsp daily  . Specialty Vitamins Products (MAMMARY) TABS Take 2 tablets by mouth daily.  . TURMERIC PO Take 7 drops by mouth daily.  Marland Kitchen UNABLE TO FIND Take 6 tablets by mouth daily. Med Name: Vitamin B 17  . UNABLE TO FIND Take 10 drops by mouth daily. Med Name: Liquid Vitamin A  . UNABLE TO FIND Med Name: Hydrex 2 vials daily  . UNABLE TO FIND Med Name: [id number] 3 every other day  . UNABLE TO FIND Med Name: Hemp Oil Complete 2 daily  . UNABLE TO FIND Med Name: Artemisinin Complex 2 daily  . UNABLE TO FIND Med Name: Adrena Calm 1/4 pump daily  . UNABLE TO FIND Med Name: Immuno Plus 3 drops daily  . UNABLE TO FIND Med Name: Rockne Menghini Glutathione 5 tsp daily  . UNABLE TO FIND Med Name: Vitamin C Drip 20,000 once weekly  . UNABLE TO FIND Med Name: Myrrh 3 drops daily  . UNABLE TO FIND Med Name: Welford Roche Vite 6 per day  . UNABLE TO FIND Med Name: Pea Protein Isolate 1 scoop per day  . UNABLE TO FIND Med Name:  Nourish Greens 1 scoop per day   No facility-administered encounter medications on file as of 02/27/2020.    Allergies (verified) Sulfa antibiotics and Tramadol   History: Past Medical History:  Diagnosis Date  . Breast CA ([place])   . Family history of breast cancer   . Family history of lung cancer   . Family history of throat cancer   . Hashimoto's disease   . Hypertension   . Non-celiac gluten sensitivity    Past Surgical History:  Procedure Laterality Date  . APPENDECTOMY  1979  . [place]  . MASTECTOMY W/ SENTINEL NODE BIOPSY Left 2019  . RIGHT OOPHORECTOMY  1979   due to ovarian torsion  . URETEROSCOPY Right 2017   Family History  Problem Relation Age of Onset  . Lung cancer Mother 3       hx smoking  . Lung cancer Father 29       hx smoking  . Breast cancer Sister 38       in remission, now in mid 47s  . Lung cancer Brother 70       smoker  . Throat cancer Brother        alcohol  . Colon cancer Neg Hx   . Cervical cancer Neg Hx   . Ovarian cancer Neg Hx    Social History   Socioeconomic History  . Marital status: Divorced    Spouse name: Not on file  . Number of children: 2  . Years of education: 90  . Highest education level: High school graduate  Occupational History  . Occupation: retired Sales promotion account executive for [employer]  Tobacco Use  . Smoking status: Never Smoker  . Smokeless tobacco: Never Used  Vaping Use  . Vaping Use: Never used  Substance and Sexual Activity  . Alcohol use: Not Currently  . Drug use: Never  . Sexual activity: Not Currently  Other Topics Concern  . Not on file  Social History Narrative  . Not on file   Social Determinants of Health   Financial Resource Strain: Low Risk   . Difficulty of Paying Living Expenses: Not hard at all  Food Insecurity: No Food Insecurity  . Worried About Charity fundraiser in the Last Year: Never true  . Ran Out of Food in the Last Year: Never true  Transportation Needs: No Transportation Needs  . Lack of Transportation (Medical): No  . Lack of Transportation (Non-Medical): No  Physical Activity: Inactive  . Days of Exercise per Week: 0 days  . Minutes of Exercise per Session: 0 min  Stress: No Stress Concern Present  . Feeling of Stress : Not at all  Social Connections: Moderately Isolated  . Frequency of Communication with Friends and Family: More than three times a week  . Frequency of Social Gatherings with Friends and Family: More than three times a week  . Attends  Religious Services: More than 4 times per year  . Active Member of Clubs or Organizations: No  . Attends Archivist Meetings: Never  . Marital Status: Divorced    Tobacco Counseling Counseling given: Not Answered   Clinical Intake:  Pre-visit preparation completed: Yes  Pain : 0-10 Pain Score: 4  Pain Type:  (3-4 months) Pain Location: Hip Pain Orientation: Right Pain Descriptors / Indicators: Aching, Shooting Pain Frequency: Constant Pain Relieving Factors: None  Pain Relieving Factors: None  Nutritional Risks: None Diabetes: No  How often do you need to have  someone help you when you read instructions, pamphlets, or other written materials from your doctor or pharmacy?: 1 - Never  Diabetic? No  Interpreter Needed?: No  Information entered by :: [facility], LPN   Activities of Daily Living In your present state of health, do you have any difficulty performing the following activities: 02/27/2020  Hearing? N  Vision? N  Difficulty concentrating or making decisions? N  Walking or climbing stairs? N  Dressing or bathing? N  Doing errands, shopping? N  Preparing Food and eating ? N  Using the Toilet? N  In the past six months, have you accidently leaked urine? N  Do you have problems with loss of bowel control? N  Managing your Medications? N  Managing your Finances? N  Housekeeping or managing your Housekeeping? N  Some recent data might be hidden    Patient Care Team: Bacigalupo, Dionne Bucy, MD as PCP - General (Family Medicine) Johny Blamer, [place] (Chiropractic Medicine) Debbora Dus, NP (Nurse Practitioner) Paulene Floor as Physician Assistant (Physician Assistant) Sindy Guadeloupe, MD as Consulting Physician (Oncology)  Indicate any recent Medical Services you may have received from other than [facility] providers in the past year (date may be approximate).     Assessment:   This is a routine wellness examination for  Meredith Hamilton.  Hearing/Vision screen No exam data present  Dietary issues and exercise activities discussed: Current Exercise Habits: The patient does not participate in regular exercise at present, Exercise limited by: None identified  Goals    . DIET - REDUCE SUGAR INTAKE     Continue current diet plan of cutting out sugar in daily diet to help aid in weight loss.     . Exercise 3x per week (30 min per time)     Recommend to exercise for 3 days a week for at least 30 minutes at a time.       Depression Screen PHQ 2/9 Scores 02/27/2020 02/18/2019 01/25/2018 11/09/2017  PHQ - 2 Score 1 0 0 0    Fall Risk Fall Risk  02/27/2020 02/18/2019 02/18/2019 01/25/2018 11/09/2017  Falls in the past year? 0 0 0 No No  Number falls in past yr: 0 0 0 - -  Injury with Fall? 0 0 0 - -    Any stairs in or around the home? Yes  If so, are there any without handrails? No  Home free of loose throw rugs in walkways, pet beds, electrical cords, etc? Yes  Adequate lighting in your home to reduce risk of falls? Yes   ASSISTIVE DEVICES UTILIZED TO PREVENT FALLS:  Life alert? No  Use of a cane, walker or w/c? No  Grab bars in the bathroom? No  Shower chair or bench in shower? No  Elevated toilet seat or a handicapped toilet? No    Cognitive Function:     6CIT Screen 02/27/2020 02/18/2019 01/25/2018  What Year? 0 points 0 points 0 points  What month? 0 points 0 points 0 points  What time? 0 points 0 points 0 points  Count back from 20 0 points 0 points 0 points  Months in reverse 0 points 0 points 0 points  Repeat phrase 0 points 0 points 0 points  Total Score 0 0 0    Immunizations  There is no immunization history on file for this patient.  TDAP status: Due, Education has been provided regarding the importance of this vaccine. Advised may receive this vaccine at local pharmacy or Health  Dept. Aware to provide a copy of the vaccination record if obtained from local pharmacy or Health Dept.  Verbalized acceptance and understanding. Flu Vaccine status: Declined, Education has been provided regarding the importance of this vaccine but patient still declined. Advised may receive this vaccine at local pharmacy or Health Dept. Aware to provide a copy of the vaccination record if obtained from local pharmacy or Health Dept. Verbalized acceptance and understanding. Pneumococcal vaccine status: Declined,  Education has been provided regarding the importance of this vaccine but patient still declined. Advised may receive this vaccine at local pharmacy or Health Dept. Aware to provide a copy of the vaccination record if obtained from local pharmacy or Health Dept. Verbalized acceptance and understanding.  Covid-19 vaccine status: Declined, Education has been provided regarding the importance of this vaccine but patient still declined. Advised may receive this vaccine at local pharmacy or Health Dept.or vaccine clinic. Aware to provide a copy of the vaccination record if obtained from local pharmacy or Health Dept. Verbalized acceptance and understanding.  Qualifies for Shingles Vaccine? Yes   Zostavax completed No   Shingrix Completed?: No.    Education has been provided regarding the importance of this vaccine. Patient has been advised to call insurance company to determine out of pocket expense if they have not yet received this vaccine. Advised may also receive vaccine at local pharmacy or Health Dept. Verbalized acceptance and understanding.  Screening Tests Health Maintenance  Topic Date Due  . COLONOSCOPY  Never done  . COVID-19 Vaccine (1) 03/14/2020 (Originally 05/31/1962)  . INFLUENZA VACCINE  07/09/2020 (Originally 11/10/2019)  . PNA vac Low Risk Adult (1 of 2 - PCV13) 02/26/2021 (Originally 05/31/2013)  . TETANUS/TDAP  02/10/2023 (Originally [birth year]1967)  . DEXA SCAN  04/23/2020  . MAMMOGRAM  06/06/2021  . Hepatitis C Screening  Completed    Health Maintenance  Health Maintenance Due   Topic Date Due  . COLONOSCOPY  Never done    Colorectal cancer screening: Referral to GI placed today. Pt aware the office will call re: appt. Mammogram status: Completed 06/07/19. Repeat every year. Bone Density status: Completed 04/23/18. Results reflect: Bone density results: OSTEOPENIA. Repeat every 2 years. Scheduled repeat scan on 04/27/20.  Lung Cancer Screening: (Low Dose CT Chest recommended if Age 37-80 years, 30 pack-year currently smoking OR have quit w/in 15years.) does not qualify.   Additional Screening:  Hepatitis C Screening: Up to date  Vision Screening: Recommended annual ophthalmology exams for early detection of glaucoma and other disorders of the eye. Is the patient up to date with their annual eye exam? No Who is the provider or what is the name of the office in which the patient attends annual eye exams? N/A If pt is not established with a provider, would they like to be referred to a provider to establish care? No .   Dental Screening: Recommended annual dental exams for proper oral hygiene  Community Resource Referral / Chronic Care Management: CRR required this visit?  No   CCM required this visit?  No      Plan:     I have personally reviewed and noted the following in the patient's chart:   . Medical and social history . Use of alcohol, tobacco or illicit drugs  . Current medications and supplements . Functional ability and status . Nutritional status . Physical activity . Advanced directives . List of other physicians . Hospitalizations, surgeries, and ER visits in previous 12 months . Vitals . Screenings to  include cognitive, depression, and falls . Referrals and appointments  In addition, I have reviewed and discussed with patient certain preventive protocols, quality metrics, and best practice recommendations. A written personalized care plan for preventive services as well as general preventive health recommendations were provided to  patient.     Tamieka Rancourt [place], [place]   [id number]   Nurse Notes: Pt declined receiving the Covid, flu or pneumonia vaccines.

## 2020-02-27 ENCOUNTER — Encounter: Payer: Self-pay | Admitting: Adult Health

## 2020-02-27 ENCOUNTER — Ambulatory Visit (INDEPENDENT_AMBULATORY_CARE_PROVIDER_SITE_OTHER): Payer: Medicare HMO

## 2020-02-27 ENCOUNTER — Ambulatory Visit (INDEPENDENT_AMBULATORY_CARE_PROVIDER_SITE_OTHER): Payer: Medicare HMO | Admitting: Adult Health

## 2020-02-27 ENCOUNTER — Other Ambulatory Visit: Payer: Self-pay

## 2020-02-27 VITALS — BP 132/78 | HR 54 | Temp 98.1°F | Resp 16 | Ht 66.0 in | Wt 202.0 lb

## 2020-02-27 DIAGNOSIS — E041 Nontoxic single thyroid nodule: Secondary | ICD-10-CM | POA: Diagnosis not present

## 2020-02-27 DIAGNOSIS — M25551 Pain in right hip: Secondary | ICD-10-CM | POA: Diagnosis not present

## 2020-02-27 DIAGNOSIS — Z1211 Encounter for screening for malignant neoplasm of colon: Secondary | ICD-10-CM

## 2020-02-27 DIAGNOSIS — Z1389 Encounter for screening for other disorder: Secondary | ICD-10-CM

## 2020-02-27 DIAGNOSIS — E559 Vitamin D deficiency, unspecified: Secondary | ICD-10-CM | POA: Diagnosis not present

## 2020-02-27 DIAGNOSIS — Z800 Family history of malignant neoplasm of digestive organs: Secondary | ICD-10-CM

## 2020-02-27 DIAGNOSIS — Z9012 Acquired absence of left breast and nipple: Secondary | ICD-10-CM

## 2020-02-27 DIAGNOSIS — Z0000 Encounter for general adult medical examination without abnormal findings: Secondary | ICD-10-CM | POA: Diagnosis not present

## 2020-02-27 LAB — POCT URINALYSIS DIPSTICK
Bilirubin, UA: NEGATIVE
Blood, UA: NEGATIVE
Glucose, UA: NEGATIVE
Ketones, UA: NEGATIVE
Nitrite, UA: NEGATIVE
Protein, UA: NEGATIVE
Spec Grav, UA: 1.01 (ref 1.010–1.025)
Urobilinogen, UA: 0.2 E.U./dL
pH, UA: 7.5 (ref 5.0–8.0)

## 2020-02-27 NOTE — Patient Instructions (Signed)
Meredith Hamilton , Thank you for taking time to come for your Medicare Wellness Visit. I appreciate your ongoing commitment to your health goals. Please review the following plan we discussed and let me know if I can assist you in the future.   Screening recommendations/referrals: Colonoscopy: Ordered today. Pt aware office will contact her to schedule apt. Mammogram: Up to date, due 05/2020 Bone Density: Up to date, next scan scheduled 04/27/20 Recommended yearly ophthalmology/optometry visit for glaucoma screening and checkup Recommended yearly dental visit for hygiene and checkup  Vaccinations: Influenza vaccine: Currently due, declined receiving. Pneumococcal vaccine: Currently due, declined receiving.  Tdap vaccine: Currently due, declined at this time. Shingles vaccine: Shingrix discussed. Please contact your pharmacy for coverage information.     Advanced directives: Advance directive discussed with you today. Even though you declined this today please call our office should you change your mind and we can give you the proper paperwork for you to fill out.  Conditions/risks identified: Continue current diet plan of cutting back on sugar intake and try to work towards exercising 3 days a week for at least 30 minutes at a time.   Next appointment: 2:00 PM today with Dr Brita Romp    Preventive Care 13 Years and Older, Female Preventive care refers to lifestyle choices and visits with your health care provider that can promote health and wellness. What does preventive care include?  A yearly physical exam. This is also called an annual well check.  Dental exams once or twice a year.  Routine eye exams. Ask your health care provider how often you should have your eyes checked.  Personal lifestyle choices, including:  Daily care of your teeth and gums.  Regular physical activity.  Eating a healthy diet.  Avoiding tobacco and drug use.  Limiting alcohol use.  Practicing safe  sex.  Taking low-dose aspirin every day.  Taking vitamin and mineral supplements as recommended by your health care provider. What happens during an annual well check? The services and screenings done by your health care provider during your annual well check will depend on your age, overall health, lifestyle risk factors, and family history of disease. Counseling  Your health care provider may ask you questions about your:  Alcohol use.  Tobacco use.  Drug use.  Emotional well-being.  Home and relationship well-being.  Sexual activity.  Eating habits.  History of falls.  Memory and ability to understand (cognition).  Work and work Statistician.  Reproductive health. Screening  You may have the following tests or measurements:  Height, weight, and BMI.  Blood pressure.  Lipid and cholesterol levels. These may be checked every 5 years, or more frequently if you are over 86 years old.  Skin check.  Lung cancer screening. You may have this screening every year starting at age 100 if you have a 30-pack-year history of smoking and currently smoke or have quit within the past 15 years.  Fecal occult blood test (FOBT) of the stool. You may have this test every year starting at age 100.  Flexible sigmoidoscopy or colonoscopy. You may have a sigmoidoscopy every 5 years or a colonoscopy every 10 years starting at age 47.  Hepatitis C blood test.  Hepatitis B blood test.  Sexually transmitted disease (STD) testing.  Diabetes screening. This is done by checking your blood sugar (glucose) after you have not eaten for a while (fasting). You may have this done every 1-3 years.  Bone density scan. This is done to screen for  osteoporosis. You may have this done starting at age 15.  Mammogram. This may be done every 1-2 years. Talk to your health care provider about how often you should have regular mammograms. Talk with your health care provider about your test results,  treatment options, and if necessary, the need for more tests. Vaccines  Your health care provider may recommend certain vaccines, such as:  Influenza vaccine. This is recommended every year.  Tetanus, diphtheria, and acellular pertussis (Tdap, Td) vaccine. You may need a Td booster every 10 years.  Zoster vaccine. You may need this after age 29.  Pneumococcal 13-valent conjugate (PCV13) vaccine. One dose is recommended after age 6.  Pneumococcal polysaccharide (PPSV23) vaccine. One dose is recommended after age 42. Talk to your health care provider about which screenings and vaccines you need and how often you need them. This information is not intended to replace advice given to you by your health care provider. Make sure you discuss any questions you have with your health care provider. Document Released: 04/24/2015 Document Revised: 12/16/2015 Document Reviewed: 01/27/2015 Elsevier Interactive Patient Education  2017 [place] Prevention in the Home Falls can cause injuries. They can happen to people of all ages. There are many things you can do to make your home safe and to help prevent falls. What can I do on the outside of my home?  Regularly fix the edges of walkways and driveways and fix any cracks.  Remove anything that might make you trip as you walk through a door, such as a raised step or threshold.  Trim any bushes or trees on the path to your home.  Use bright outdoor lighting.  Clear any walking paths of anything that might make someone trip, such as rocks or tools.  Regularly check to see if handrails are loose or broken. Make sure that both sides of any steps have handrails.  Any raised decks and porches should have guardrails on the edges.  Have any leaves, snow, or ice cleared regularly.  Use sand or salt on walking paths during winter.  Clean up any spills in your garage right away. This includes oil or grease spills. What can I do in the  bathroom?  Use night lights.  Install grab bars by the toilet and in the tub and shower. Do not use towel bars as grab bars.  Use non-skid mats or decals in the tub or shower.  If you need to sit down in the shower, use a plastic, non-slip stool.  Keep the floor dry. Clean up any water that spills on the floor as soon as it happens.  Remove soap buildup in the tub or shower regularly.  Attach bath mats securely with double-sided non-slip rug tape.  Do not have throw rugs and other things on the floor that can make you trip. What can I do in the bedroom?  Use night lights.  Make sure that you have a light by your bed that is easy to reach.  Do not use any sheets or blankets that are too big for your bed. They should not hang down onto the floor.  Have a firm chair that has side arms. You can use this for support while you get dressed.  Do not have throw rugs and other things on the floor that can make you trip. What can I do in the kitchen?  Clean up any spills right away.  Avoid walking on wet floors.  Keep items that you use  a lot in easy-to-reach places.  If you need to reach something above you, use a strong step stool that has a grab bar.  Keep electrical cords out of the way.  Do not use floor polish or wax that makes floors slippery. If you must use wax, use non-skid floor wax.  Do not have throw rugs and other things on the floor that can make you trip. What can I do with my stairs?  Do not leave any items on the stairs.  Make sure that there are handrails on both sides of the stairs and use them. Fix handrails that are broken or loose. Make sure that handrails are as long as the stairways.  Check any carpeting to make sure that it is firmly attached to the stairs. Fix any carpet that is loose or worn.  Avoid having throw rugs at the top or bottom of the stairs. If you do have throw rugs, attach them to the floor with carpet tape.  Make sure that you have a  light switch at the top of the stairs and the bottom of the stairs. If you do not have them, ask someone to add them for you. What else can I do to help prevent falls?  Wear shoes that:  Do not have high heels.  Have rubber bottoms.  Are comfortable and fit you well.  Are closed at the toe. Do not wear sandals.  If you use a stepladder:  Make sure that it is fully opened. Do not climb a closed stepladder.  Make sure that both sides of the stepladder are locked into place.  Ask someone to hold it for you, if possible.  Clearly mark and make sure that you can see:  Any grab bars or handrails.  First and last steps.  Where the edge of each step is.  Use tools that help you move around (mobility aids) if they are needed. These include:  Canes.  Walkers.  Scooters.  Crutches.  Turn on the lights when you go into a dark area. Replace any light bulbs as soon as they burn out.  Set up your furniture so you have a clear path. Avoid moving your furniture around.  If any of your floors are uneven, fix them.  If there are any pets around you, be aware of where they are.  Review your medicines with your doctor. Some medicines can make you feel dizzy. This can increase your chance of falling. Ask your doctor what other things that you can do to help prevent falls. This information is not intended to replace advice given to you by your health care provider. Make sure you discuss any questions you have with your health care provider. Document Released: 01/22/2009 Document Revised: 09/03/2015 Document Reviewed: 05/02/2014 Elsevier Interactive Patient Education  2017 [employer].

## 2020-02-27 NOTE — Progress Notes (Signed)
Send for culture

## 2020-02-27 NOTE — Patient Instructions (Addendum)
Iliotibial Band Syndrome  Iliotibial band syndrome (ITBS) is a condition that often causes knee pain. It can also cause pain in the outside of your hip, thigh, and knee. The iliotibial band is a strip of tissue that runs from the outside of your hip and down your thigh to the outside of your knee. Repeatedly bending and straightening your knee can irritate the iliotibial band. What are the causes? This condition is caused by inflammation and irritation from the friction of the iliotibial band moving over the thigh bone (femur) when you repeatedly bend and straighten your knee. What increases the risk? This condition is more likely to develop in people who:  Frequently change elevation during their workouts.  Run very long distances.  Recently increased the length or intensity of their workouts.  Run downhill often, or just started running downhill.  Ride a bike very far or often. You may also be at greater risk if you start a new workout routine without first warming up or if you have a job that requires you to bend, squat, or climb frequently. What are the signs or symptoms? Symptoms of this condition include:  Pain along the outside of your knee that may be worse with activity, especially running or going up and down stairs.  A "snapping" sensation over your knee.  Swelling on the outside of your knee.  Pain or a feeling of tightness in your hip. How is this diagnosed? This condition is diagnosed based on your symptoms, medical history, and physical exam. You may also see a health care provider who specializes in reducing pain and increasing mobility (physical therapist). A physical therapist may do an exam to check your balance, movement, and way of walking or running (gait) to see whether the way you move could contribute to your injury. You may also have tests to measure your strength, flexibility, and range of motion. How is this treated? Treatment for this condition  includes:  Resting and limiting exercise.  Returning to activities gradually.  Doing range-of-motion and strengthening exercises (physical therapy) as told by your health care provider.  Including low-impact activities, such as swimming, in your exercise routine. Follow these instructions at home:  If directed, apply ice to the injured area. ? Put ice in a plastic bag. ? Place a towel between your skin and the bag. ? Leave the ice on for 20 minutes, 2-3 times per day.  Return to your normal activities as told by your health care provider. Ask your health care provider what activities are safe for you.  Keep all follow-up visits with your health care provider. This is important. Contact a health care provider if:  Your pain does not improve or gets worse despite treatment. This information is not intended to replace advice given to you by your health care provider. Make sure you discuss any questions you have with your health care provider. Document Revised: 03/10/2017 Document Reviewed: 04/29/2016 Elsevier Patient Education  [place]. Hip Pain The hip is the joint between the upper legs and the lower pelvis. The bones, cartilage, tendons, and muscles of your hip joint support your body and allow you to move around. Hip pain can range from a minor ache to severe pain in one or both of your hips. The pain may be felt on the inside of the hip joint near the groin, or on the outside near the buttocks and upper thigh. You may also have swelling or stiffness in your hip area. Follow these instructions  at home: Managing pain, stiffness, and swelling      If directed, put ice on the painful area. To do this: ? Put ice in a plastic bag. ? Place a towel between your skin and the bag. ? Leave the ice on for 20 minutes, 2-3 times a day.  If directed, apply heat to the affected area as often as told by your health care provider. Use the heat source that your health care provider  recommends, such as a moist heat pack or a heating pad. ? Place a towel between your skin and the heat source. ? Leave the heat on for 20-30 minutes. ? Remove the heat if your skin turns bright red. This is especially important if you are unable to feel pain, heat, or cold. You may have a greater risk of getting burned. Activity  Do exercises as told by your health care provider.  Avoid activities that cause pain. General instructions   Take over-the-counter and prescription medicines only as told by your health care provider.  Keep a journal of your symptoms. Write down: ? How often you have hip pain. ? The location of your pain. ? What the pain feels like. ? What makes the pain worse.  Sleep with a pillow between your legs on your most comfortable side.  Keep all follow-up visits as told by your health care provider. This is important. Contact a health care provider if:  You cannot put weight on your leg.  Your pain or swelling continues or gets worse after one week.  It gets harder to walk.  You have a fever. Get help right away if:  You fall.  You have a sudden increase in pain and swelling in your hip.  Your hip is red or swollen or very tender to touch. Summary  Hip pain can range from a minor ache to severe pain in one or both of your hips.  The pain may be felt on the inside of the hip joint near the groin, or on the outside near the buttocks and upper thigh.  Avoid activities that cause pain.  Write down how often you have hip pain, the location of the pain, what makes it worse, and what it feels like. This information is not intended to replace advice given to you by your health care provider. Make sure you discuss any questions you have with your health care provider. Document Revised: 08/13/2018 Document Reviewed: 08/13/2018 Elsevier Patient Education  [place].

## 2020-02-27 NOTE — Progress Notes (Signed)
Complete physical exam   Patient: Meredith Hamilton   DOB: 1951-03-31   69 y.o. Female  MRN: [id number] Visit Date: 02/27/2020  Today's healthcare provider: Marcille Buffy, FNP   Chief Complaint  Patient presents with  . Annual Exam   Subjective    Meredith Hamilton is a 69 y.o. female who presents today for a complete physical exam.  She reports consuming a general diet. The patient does not participate in regular exercise at present. She generally feels fairly well. She reports sleeping fairly well. She does not have additional problems to discuss today.   Had AWV with [facility] today at 9am.   Right ovary has been removed. Uterus and left ovary intact.  She has had history of all normal PAP in past. Denies any vaginal bleeding.  Denies any rectal bleeding.    S/P breast cancer 3.5 years ago, She has not taken chemotherapy.  She was given 3 years to live. Dr. Micheline Chapman.    She reports she has had a colonoscopy and it was around 5 years, she was told to come back in 5 years and polyp was non cancerous.  Declines covid vaccine, and Shingles.    Hip Pain  Incident onset: 3 months ago. There was no injury mechanism. The pain is present in the right hip. The quality of the pain is described as shooting and aching. Associated symptoms include muscle weakness (of right leg). Pertinent negatives include no tingling. She has tried nothing for the symptoms.    Denies any injury or fall. She has pain in right hip that is intermittent. She reports worse with activity. Denies any back pain or leg pain.    Past Medical History:  Diagnosis Date  . Breast CA ([place])   . Family history of breast cancer   . Family history of lung cancer   . Family history of throat cancer   . Hashimoto's disease   . Hypertension   . Non-celiac gluten sensitivity    Past Surgical History:  Procedure Laterality Date  . APPENDECTOMY  1979  . [place]  . MASTECTOMY W/ SENTINEL NODE BIOPSY Left  2019  . RIGHT OOPHORECTOMY  1979   due to ovarian torsion  . URETEROSCOPY Right 2017   Social History   Socioeconomic History  . Marital status: Divorced    Spouse name: Not on file  . Number of children: 2  . Years of education: 16  . Highest education level: High school graduate  Occupational History  . Occupation: retired Sales promotion account executive for [employer]  Tobacco Use  . Smoking status: Never Smoker  . Smokeless tobacco: Never Used  Vaping Use  . Vaping Use: Never used  Substance and Sexual Activity  . Alcohol use: Not Currently  . Drug use: Never  . Sexual activity: Not Currently  Other Topics Concern  . Not on file  Social History Narrative  . Not on file   Social Determinants of Health   Financial Resource Strain: Low Risk   . Difficulty of Paying Living Expenses: Not hard at all  Food Insecurity: No Food Insecurity  . Worried About Charity fundraiser in the Last Year: Never true  . Ran Out of Food in the Last Year: Never true  Transportation Needs: No Transportation Needs  . Lack of Transportation (Medical): No  . Lack of Transportation (Non-Medical): No  Physical Activity: Inactive  . Days of Exercise per Week: 0 days  . Minutes of  Exercise per Session: 0 min  Stress: No Stress Concern Present  . Feeling of Stress : Not at all  Social Connections: Moderately Isolated  . Frequency of Communication with Friends and Family: More than three times a week  . Frequency of Social Gatherings with Friends and Family: More than three times a week  . Attends Religious Services: More than 4 times per year  . Active Member of Clubs or Organizations: No  . Attends Archivist Meetings: Never  . Marital Status: Divorced  Human resources officer Violence: Not At Risk  . Fear of Current or Ex-Partner: No  . Emotionally Abused: No  . Physically Abused: No  . Sexually Abused: No   Family Status  Relation Name Status  . Mother  Deceased  . Father  Deceased  . Sister   (Not Specified)  . Brother  Deceased  . Brother  Deceased  . MGM  Deceased  . MGF  Deceased  . PGM  Deceased  . PGF  Deceased  . Neg Hx  (Not Specified)   Family History  Problem Relation Age of Onset  . Lung cancer Mother 20       hx smoking  . Lung cancer Father 22       hx smoking  . Breast cancer Sister 5       in remission, now in mid 38s  . Lung cancer Brother 28       smoker  . Throat cancer Brother        alcohol  . Colon cancer Neg Hx   . Cervical cancer Neg Hx   . Ovarian cancer Neg Hx    Allergies  Allergen Reactions  . Sulfa Antibiotics Swelling  . Tramadol Other (See Comments)    Other reaction(s): Hallucination    Patient Care Team: Bacigalupo, Dionne Bucy, MD as PCP - General (Family Medicine) Johny Blamer, [place] (Chiropractic Medicine) Debbora Dus, NP (Nurse Practitioner) Paulene Floor as Physician Assistant (Physician Assistant) Sindy Guadeloupe, MD as Consulting Physician (Oncology)   Medications: Outpatient Medications Prior to Visit  Medication Sig  . amLODipine (NORVASC) 10 MG tablet Take 1 tablet (10 mg total) by mouth daily.  Marland Kitchen anastrozole (ARIMIDEX) 1 MG tablet TAKE 1 TABLET(1 MG) BY MOUTH DAILY  . Cholecalciferol (VITAMIN D3) LIQD Take 6 drops by mouth daily.  . Homeopathic Products (FRANKINCENSE UPLIFTING) OIL Take 7 drops by mouth daily.  . Misc Natural Products (SPLEEN) CAPS Take 2 capsules by mouth daily.  Marland Kitchen OVER THE COUNTER MEDICATION Take 4 Scoops by mouth daily. Pecta Sol-C (Mod. Citrus Pectin)  . OVER THE COUNTER MEDICATION Take 2 Scoops by mouth daily. Fermented Mushroom Blend  . OVER THE COUNTER MEDICATION Take 6 capsules by mouth daily. Cruciferous Complete  . OVER THE COUNTER MEDICATION Nitric Balance 4 tsp daily  . Specialty Vitamins Products (MAMMARY) TABS Take 2 tablets by mouth daily.  . TURMERIC PO Take 7 drops by mouth daily.  Marland Kitchen UNABLE TO FIND Take 6 tablets by mouth daily. Med Name: Vitamin B 17  . UNABLE TO  FIND Take 10 drops by mouth daily. Med Name: Liquid Vitamin A  . UNABLE TO FIND Med Name: Hydrex 2 vials daily  . UNABLE TO FIND Med Name: GLOVFIEP-PI 3 every other day  . UNABLE TO FIND Med Name: Hemp Oil Complete 2 daily  . UNABLE TO FIND Med Name: Artemisinin Complex 2 daily  . UNABLE TO FIND Med Name: Adrena Calm 1/4 pump  daily  . UNABLE TO FIND Med Name: Immuno Plus 3 drops daily  . UNABLE TO FIND Med Name: Rockne Menghini Glutathione 5 tsp daily  . UNABLE TO FIND Med Name: Vitamin C Drip 20,000 once weekly  . UNABLE TO FIND Med Name: Myrrh 3 drops daily  . UNABLE TO FIND Med Name: Welford Roche Vite 6 per day  . UNABLE TO FIND Med Name: Pea Protein Isolate 1 scoop per day  . UNABLE TO FIND Med Name: Alen Blew 1 scoop per day   No facility-administered medications prior to visit.    Review of Systems  Constitutional: Negative.  Negative for chills, fatigue and fever.  HENT: Negative for congestion, ear pain, rhinorrhea, sneezing and sore throat.   Eyes: Negative.  Negative for pain and redness.  Respiratory: Negative for cough, shortness of breath and wheezing.   Cardiovascular: Negative for chest pain and leg swelling.  Gastrointestinal: Negative for abdominal pain, blood in stool, constipation, diarrhea and nausea.  Endocrine: Negative for polydipsia and polyphagia.  Genitourinary: Negative.  Negative for dysuria, flank pain, hematuria, pelvic pain, vaginal bleeding and vaginal discharge.  Musculoskeletal: Positive for arthralgias (right hip painful x 2 weeks. ) and gait problem. Negative for back pain and joint swelling.  Skin: Negative for rash.  Neurological: Negative for dizziness, tingling, tremors, seizures, weakness (right hip and leg), light-headedness and headaches.  Hematological: Negative for adenopathy.  Psychiatric/Behavioral: Negative.  Negative for behavioral problems, confusion and dysphoric mood. The patient is not nervous/anxious and is not hyperactive.     Last  CBC Lab Results  Component Value Date   WBC 5.8 02/27/2020   HGB 13.0 02/27/2020   HCT 37.7 02/27/2020   MCV 93 02/27/2020   MCH 31.9 02/27/2020   RDW 12.4 02/27/2020   PLT 167 53/61/4431   Last metabolic panel Lab Results  Component Value Date   GLUCOSE 80 02/27/2020   NA 143 02/27/2020   K 3.6 02/27/2020   CL 104 02/27/2020   CO2 24 02/27/2020   BUN 9 02/27/2020   CREATININE 0.75 02/27/2020   GFRNONAA 80 02/27/2020   GFRAA 93 02/27/2020   CALCIUM 10.3 02/27/2020   PHOS 3.9 01/16/2018   PROT 7.1 02/27/2020   ALBUMIN 4.9 (H) 02/27/2020   LABGLOB 2.2 02/27/2020   AGRATIO 2.2 02/27/2020   BILITOT 0.7 02/27/2020   ALKPHOS 90 02/27/2020   AST 20 02/27/2020   ALT 14 02/27/2020   ANIONGAP 8 04/30/2019   Last lipids Lab Results  Component Value Date   CHOL 152 02/27/2020   HDL 57 02/27/2020   LDLCALC 75 02/27/2020   TRIG 111 02/27/2020   CHOLHDL 2.7 02/18/2019   Last hemoglobin A1c Lab Results  Component Value Date   HGBA1C 5.0 01/16/2018   Last thyroid functions Lab Results  Component Value Date   TSH 2.940 02/27/2020   T3TOTAL 104 01/16/2018   Last vitamin D Lab Results  Component Value Date   VD25OH 46.4 02/27/2020   Last vitamin B12 and Folate Lab Results  Component Value Date   VITAMINB12 211 (L) 02/27/2020      Objective    BP 132/78 (BP Location: Right Arm, Patient Position: Sitting, Cuff Size: Large)   Pulse (!) 54   Temp 98.1 F (36.7 C) (Oral)   Resp 16   Ht 5\' 6"  (1.676 m)   Wt 202 lb (91.6 kg)   BMI 32.60 kg/m  BP Readings from Last 3 Encounters:  02/27/20 132/78  12/02/19 123/65  09/30/19 134/72  Wt Readings from Last 3 Encounters:  02/27/20 202 lb (91.6 kg)  12/02/19 200 lb 11.2 oz (91 kg)  09/30/19 198 lb (89.8 kg)      Physical Exam Vitals reviewed.  Constitutional:      General: She is not in acute distress.    Appearance: She is well-developed. She is not diaphoretic.     Interventions: She is not  intubated. HENT:     Head: Normocephalic and atraumatic.     Right Ear: External ear normal.     Left Ear: External ear normal.     Nose: Nose normal.     Mouth/Throat:     Pharynx: No oropharyngeal exudate.  Eyes:     General: Lids are normal. No scleral icterus.       Right eye: No discharge.        Left eye: No discharge.     Conjunctiva/sclera: Conjunctivae normal.     Right eye: Right conjunctiva is not injected. No exudate or hemorrhage.    Left eye: Left conjunctiva is not injected. No exudate or hemorrhage.    Pupils: Pupils are equal, round, and reactive to light.  Neck:     Thyroid: No thyroid mass or thyromegaly.     Vascular: Normal carotid pulses. No carotid bruit, hepatojugular reflux or JVD.     Trachea: Trachea and phonation normal. No tracheal tenderness or tracheal deviation.     Meningeal: Brudzinski's sign and Kernig's sign absent.  Cardiovascular:     Rate and Rhythm: Normal rate and regular rhythm.     Pulses: Normal pulses.          Radial pulses are 2+ on the right side and 2+ on the left side.       Dorsalis pedis pulses are 2+ on the right side and 2+ on the left side.       Posterior tibial pulses are 2+ on the right side and 2+ on the left side.     Heart sounds: Normal heart sounds, S1 normal and S2 normal. Heart sounds not distant. No murmur heard.  No friction rub. No gallop.   Pulmonary:     Effort: Pulmonary effort is normal. No tachypnea, bradypnea, accessory muscle usage or respiratory distress. She is not intubated.     Breath sounds: Normal breath sounds. No stridor. No wheezing or rales.  Chest:     Chest wall: No tenderness.  Abdominal:     General: Bowel sounds are normal. There is no distension or abdominal bruit.     Palpations: Abdomen is soft. There is no shifting dullness, fluid wave, hepatomegaly, splenomegaly, mass or pulsatile mass.     Tenderness: There is no abdominal tenderness. There is no guarding or rebound.     Hernia: No  hernia is present.  Musculoskeletal:        General: No tenderness or deformity. Normal range of motion.     Cervical back: Full passive range of motion without pain, normal range of motion and neck supple. No edema, erythema or rigidity. No spinous process tenderness or muscular tenderness. Normal range of motion.       Legs:     Comments: Hip normal range of motion,  Negative straight leg raise bilaterally.    Lymphadenopathy:     Head:     Right side of head: No submental, submandibular, tonsillar, preauricular, posterior auricular or occipital adenopathy.     Left side of head: No submental, submandibular, tonsillar, preauricular, posterior auricular or  occipital adenopathy.     Cervical: No cervical adenopathy.     Right cervical: No superficial, deep or posterior cervical adenopathy.    Left cervical: No superficial, deep or posterior cervical adenopathy.     Upper Body:     Right upper body: No supraclavicular or pectoral adenopathy.     Left upper body: No supraclavicular or pectoral adenopathy.  Skin:    General: Skin is warm and dry.     Coloration: Skin is not pale.     Findings: No abrasion, bruising, burn, ecchymosis, erythema, lesion, petechiae or rash.     Nails: There is no clubbing.  Neurological:     Mental Status: She is alert and oriented to person, place, and time.     GCS: GCS eye subscore is 4. GCS verbal subscore is 5. GCS motor subscore is 6.     Cranial Nerves: No cranial nerve deficit.     Sensory: No sensory deficit.     Motor: No tremor, atrophy, abnormal muscle tone or seizure activity.     Coordination: Coordination normal.     Gait: Gait normal.     Deep Tendon Reflexes: Reflexes are normal and symmetric. Reflexes normal. Babinski sign absent on the right side. Babinski sign absent on the left side.     Reflex Scores:      Tricep reflexes are 2+ on the right side and 2+ on the left side.      Bicep reflexes are 2+ on the right side and 2+ on the left  side.      Brachioradialis reflexes are 2+ on the right side and 2+ on the left side.      Patellar reflexes are 2+ on the right side and 2+ on the left side.      Achilles reflexes are 2+ on the right side and 2+ on the left side. Psychiatric:        Speech: Speech normal.        Behavior: Behavior normal.        Thought Content: Thought content normal.        Judgment: Judgment normal.       Last depression screening scores PHQ 2/9 Scores 02/27/2020 02/18/2019 01/25/2018  PHQ - 2 Score 1 0 0   Last fall risk screening Fall Risk  02/27/2020  Falls in the past year? 0  Number falls in past yr: 0  Injury with Fall? 0   Last Audit-C alcohol use screening Alcohol Use Disorder Test (AUDIT) 02/27/2020  1. How often do you have a drink containing alcohol? 0  2. How many drinks containing alcohol do you have on a typical day when you are drinking? 0  3. How often do you have six or more drinks on one occasion? 0  AUDIT-C Score 0  Alcohol Brief Interventions/Follow-up AUDIT Score <7 follow-up not indicated   A score of 3 or more in women, and 4 or more in men indicates increased risk for alcohol abuse, EXCEPT if all of the points are from question 1   Results for orders placed or performed in visit on 02/27/20  CBC with Differential/Platelet  Result Value Ref Range   WBC 5.8 3.4 - 10.8 x10E3/uL   RBC 4.07 3.77 - 5.28 x10E6/uL   Hemoglobin 13.0 11.1 - 15.9 g/dL   Hematocrit 37.7 34.0 - 46.6 %   MCV 93 79 - 97 fL   MCH 31.9 26.6 - 33.0 pg   MCHC 34.5 31 - 35  g/dL   RDW 12.4 11.7 - 15.4 %   Platelets 167 150 - 450 x10E3/uL   Neutrophils 48 Not Estab. %   Lymphs 42 Not Estab. %   Monocytes 7 Not Estab. %   Eos 2 Not Estab. %   Basos 1 Not Estab. %   Neutrophils Absolute 2.8 1.40 - 7.00 x10E3/uL   Lymphocytes Absolute 2.5 0 - 3 x10E3/uL   Monocytes Absolute 0.4 0 - 0 x10E3/uL   EOS (ABSOLUTE) 0.1 0.0 - 0.4 x10E3/uL   Basophils Absolute 0.0 0 - 0 x10E3/uL   Immature  Granulocytes 0 Not Estab. %   Immature Grans (Abs) 0.0 0.0 - 0.1 x10E3/uL  Comprehensive Metabolic Panel (CMET)  Result Value Ref Range   Glucose 80 65 - 99 mg/dL   BUN 9 8 - 27 mg/dL   Creatinine, Ser 0.75 0.57 - 1.00 mg/dL   GFR calc non Af Amer 80 >59 mL/min/1.73   GFR calc Af Amer 93 >59 mL/min/1.73   BUN/Creatinine Ratio 12 12 - 28   Sodium 143 134 - 144 mmol/L   Potassium 3.6 3.5 - 5.2 mmol/L   Chloride 104 96 - 106 mmol/L   CO2 24 20 - 29 mmol/L   Calcium 10.3 8.7 - 10.3 mg/dL   Total Protein 7.1 6.0 - 8.5 g/dL   Albumin 4.9 (H) 3.7 - 4.7 g/dL   Globulin, Total 2.2 1.5 - 4.5 g/dL   Albumin/Globulin Ratio 2.2 1.2 - 2.2   Bilirubin Total 0.7 0.0 - 1.2 mg/dL   Alkaline Phosphatase 90 44 - 121 IU/L   AST 20 0 - 40 IU/L   ALT 14 0 - 32 IU/L  VITAMIN D 25 Hydroxy (Vit-D Deficiency, Fractures)  Result Value Ref Range   Vit D, 25-Hydroxy 46.4 30.0 - 100.0 ng/mL  B12  Result Value Ref Range   Vitamin B-12 211 (L) 232 - 1,245 pg/mL  TSH  Result Value Ref Range   TSH 2.940 0.450 - 4.500 uIU/mL  Lipid Panel w/o Chol/HDL Ratio  Result Value Ref Range   Cholesterol, Total 152 100 - 199 mg/dL   Triglycerides 111 0 - 149 mg/dL   HDL 57 >39 mg/dL   VLDL Cholesterol Cal 20 5 - 40 mg/dL   LDL Chol Calc (NIH) 75 0 - 99 mg/dL  POCT Urinalysis Dipstick  Result Value Ref Range   Color, UA yellow    Clarity, UA clear    Glucose, UA Negative Negative   Bilirubin, UA negative    Ketones, UA negative    Spec Grav, UA 1.010 1.010 - 1.025   Blood, UA negative    pH, UA 7.5 5.0 - 8.0   Protein, UA Negative Negative   Urobilinogen, UA 0.2 0.2 or 1.0 E.U./dL   Nitrite, UA negative    Leukocytes, UA Trace (A) Negative   Appearance     Odor      Assessment & Plan    Routine Health Maintenance and Physical Exam  Exercise Activities and Dietary recommendations Goals    . DIET - REDUCE SUGAR INTAKE     Continue current diet plan of cutting out sugar in daily diet to help aid in  weight loss.     . Exercise 3x per week (30 min per time)     Recommend to exercise for 3 days a week for at least 30 minutes at a time.         There is no immunization history on file for this  patient.  Health Maintenance  Topic Date Due  . COLONOSCOPY  Never done  . COVID-19 Vaccine (1) 03/14/2020 (Originally 05/31/1962)  . INFLUENZA VACCINE  07/09/2020 (Originally 11/10/2019)  . PNA vac Low Risk Adult (1 of 2 - PCV13) 02/26/2021 (Originally 05/31/2013)  . TETANUS/TDAP  02/10/2023 (Originally 06/01/1967)  . DEXA SCAN  04/23/2020  . MAMMOGRAM  06/06/2021  . Hepatitis C Screening  Completed    Discussed health benefits of physical activity, and encouraged her to engage in regular exercise appropriate for her age and condition. Right hip pain - Plan: DG Hip Unilat W OR W/O Pelvis 2-3 Views Right  Screening for colon cancer - Plan: Ambulatory referral to Gastroenterology  Vitamin D insufficiency - Plan: VITAMIN D 25 Hydroxy (Vit-D Deficiency, Fractures)  Thyroid nodule - Plan: CBC with Differential/Platelet, Comprehensive Metabolic Panel (CMET), Y85, TSH, Lipid Panel w/o Chol/HDL Ratio  Status post mastectomy, left  Screening for blood or protein in urine - Plan: POCT Urinalysis Dipstick  Advised follow up with endocrinology for thyroid nodule she reports she has follow up's.   Follow up sooner  if hip pain not resolving. Declined antiinflammatory. May need orthopedics.   Return in about 3 months (around 05/29/2020), or if symptoms worsen or fail to improve, for at any time for any worsening symptoms, Go to Emergency room/ urgent care if worse.      Marcille Buffy, [place] [phone] (phone) [phone] (fax)  [place]

## 2020-02-28 ENCOUNTER — Ambulatory Visit
Admission: RE | Admit: 2020-02-28 | Discharge: 2020-02-28 | Disposition: A | Discharge status: Home / Self Care | Payer: Medicare HMO | Source: Ambulatory Visit | Attending: Adult Health | Admitting: Adult Health

## 2020-02-28 ENCOUNTER — Ambulatory Visit
Admission: RE | Admit: 2020-02-28 | Discharge: 2020-02-28 | Disposition: A | Discharge status: Home / Self Care | Payer: Medicare HMO | Attending: Adult Health | Admitting: Adult Health

## 2020-02-28 ENCOUNTER — Other Ambulatory Visit: Payer: Self-pay

## 2020-02-28 DIAGNOSIS — M25551 Pain in right hip: Secondary | ICD-10-CM | POA: Diagnosis present

## 2020-02-28 LAB — VITAMIN D 25 HYDROXY (VIT D DEFICIENCY, FRACTURES): Vit D, 25-Hydroxy: 46.4 ng/mL (ref 30.0–100.0)

## 2020-02-28 LAB — CBC WITH DIFFERENTIAL/PLATELET
Basophils Absolute: 0 10*3/uL (ref 0.0–0.2)
Basos: 1 %
EOS (ABSOLUTE): 0.1 10*3/uL (ref 0.0–0.4)
Eos: 2 %
Hematocrit: 37.7 % (ref 34.0–46.6)
Hemoglobin: 13 g/dL (ref 11.1–15.9)
Immature Grans (Abs): 0 10*3/uL (ref 0.0–0.1)
Immature Granulocytes: 0 %
Lymphocytes Absolute: 2.5 10*3/uL (ref 0.7–3.1)
Lymphs: 42 %
MCH: 31.9 pg (ref 26.6–33.0)
MCHC: 34.5 g/dL (ref 31.5–35.7)
MCV: 93 fL (ref 79–97)
Monocytes Absolute: 0.4 10*3/uL (ref 0.1–0.9)
Monocytes: 7 %
Neutrophils Absolute: 2.8 10*3/uL (ref 1.4–7.0)
Neutrophils: 48 %
Platelets: 167 10*3/uL (ref 150–450)
RBC: 4.07 x10E6/uL (ref 3.77–5.28)
RDW: 12.4 % (ref 11.7–15.4)
WBC: 5.8 10*3/uL (ref 3.4–10.8)

## 2020-02-28 LAB — COMPREHENSIVE METABOLIC PANEL
ALT: 14 IU/L (ref 0–32)
AST: 20 IU/L (ref 0–40)
Albumin/Globulin Ratio: 2.2 (ref 1.2–2.2)
Albumin: 4.9 g/dL — ABNORMAL HIGH (ref 3.7–4.7)
Alkaline Phosphatase: 90 IU/L (ref 44–121)
BUN/Creatinine Ratio: 12 (ref 12–28)
BUN: 9 mg/dL (ref 8–27)
Bilirubin Total: 0.7 mg/dL (ref 0.0–1.2)
CO2: 24 mmol/L (ref 20–29)
Calcium: 10.3 mg/dL (ref 8.7–10.3)
Chloride: 104 mmol/L (ref 96–106)
Creatinine, Ser: 0.75 mg/dL (ref 0.57–1.00)
GFR calc Af Amer: 93 mL/min/{1.73_m2} (ref >59)
GFR calc non Af Amer: 80 mL/min/{1.73_m2} (ref >59)
Globulin, Total: 2.2 g/dL (ref 1.5–4.5)
Glucose: 80 mg/dL (ref 65–99)
Potassium: 3.6 mmol/L (ref 3.5–5.2)
Sodium: 143 mmol/L (ref 134–144)
Total Protein: 7.1 g/dL (ref 6.0–8.5)

## 2020-02-28 LAB — LIPID PANEL W/O CHOL/HDL RATIO
Cholesterol, Total: 152 mg/dL (ref 100–199)
HDL: 57 mg/dL (ref >39)
LDL Chol Calc (NIH): 75 mg/dL (ref 0–99)
Triglycerides: 111 mg/dL (ref 0–149)
VLDL Cholesterol Cal: 20 mg/dL (ref 5–40)

## 2020-02-28 LAB — TSH: TSH: 2.94 u[IU]/mL (ref 0.450–4.500)

## 2020-02-28 LAB — VITAMIN B12: Vitamin B-12: 211 pg/mL — ABNORMAL LOW (ref 232–1245)

## 2020-02-28 NOTE — Progress Notes (Signed)
CBC within normal limits, CMP within normal limits, trivial elevation in albumin.  Vitamin D is within normal. B12 is slightly low at 211, she may be taking B 12 through her homeopathic treatments, can verify ? Cholesterol is within normal limits.

## 2020-03-02 DIAGNOSIS — M25551 Pain in right hip: Secondary | ICD-10-CM | POA: Insufficient documentation

## 2020-03-02 DIAGNOSIS — E559 Vitamin D deficiency, unspecified: Secondary | ICD-10-CM | POA: Insufficient documentation

## 2020-03-02 NOTE — Progress Notes (Signed)
Known right kidney stone is seen in right kidney.  Stool is noted throughout colon- would increase fiber. Calcification on the right sacrotuberous ligament seen this may be cause of her hip pain, I recommend a orthopedic evaluation for further evaluation if she is willing.   Tension in the sacrotuberous ligaments hinders pelvic mobility, causes misalignment of the pelvis, sacrum, or coccyx & may result in tension in the pelvic floor muscles, hamstrings.

## 2020-03-03 NOTE — Progress Notes (Signed)
Noted  

## 2020-03-09 ENCOUNTER — Telehealth (INDEPENDENT_AMBULATORY_CARE_PROVIDER_SITE_OTHER): Payer: Self-pay | Admitting: Gastroenterology

## 2020-03-09 ENCOUNTER — Other Ambulatory Visit: Payer: Self-pay

## 2020-03-09 DIAGNOSIS — Z1211 Encounter for screening for malignant neoplasm of colon: Secondary | ICD-10-CM

## 2020-03-09 MED ORDER — NA SULFATE-K SULFATE-MG SULF 17.5-3.13-1.6 GM/177ML PO SOLN: 1.0000 | Freq: Once | ORAL | 0 refills | Status: AC

## 2020-03-09 NOTE — Progress Notes (Signed)
Gastroenterology Pre-Procedure Review  Request Date: 04/17/20 Requesting Physician: Dr. Bonna Gains  PATIENT REVIEW QUESTIONS: The patient responded to the following health history questions as indicated:    1. Are you having any GI issues? no 2. Do you have a personal history of Polyps? yes (5 years ago in [place]) 3. Do you have a family history of Colon Cancer or Polyps? no 4. Diabetes Mellitus? no 5. Joint replacements in the past 12 months?no 6. Major health problems in the past 3 months?no 7. Any artificial heart valves, MVP, or defibrillator?no    MEDICATIONS & ALLERGIES:    Patient reports the following regarding taking any anticoagulation/antiplatelet therapy:   Plavix, Coumadin, Eliquis, Xarelto, Lovenox, Pradaxa, Brilinta, or Effient? no Aspirin? no  Patient confirms/reports the following medications:  Current Outpatient Medications  Medication Sig Dispense Refill  . amLODipine (NORVASC) 10 MG tablet Take 1 tablet (10 mg total) by mouth daily. 90 tablet 3  . anastrozole (ARIMIDEX) 1 MG tablet TAKE 1 TABLET(1 MG) BY MOUTH DAILY 90 tablet 3  . Cholecalciferol (VITAMIN D3) LIQD Take 6 drops by mouth daily.    . Homeopathic Products (FRANKINCENSE UPLIFTING) OIL Take 7 drops by mouth daily.    . Misc Natural Products (SPLEEN) CAPS Take 2 capsules by mouth daily.    Marland Kitchen OVER THE COUNTER MEDICATION Take 4 Scoops by mouth daily. Pecta Sol-C (Mod. Citrus Pectin)    . OVER THE COUNTER MEDICATION Take 2 Scoops by mouth daily. Fermented Mushroom Blend    . OVER THE COUNTER MEDICATION Take 6 capsules by mouth daily. Cruciferous Complete    . OVER THE COUNTER MEDICATION Nitric Balance 4 tsp daily    . Specialty Vitamins Products (MAMMARY) TABS Take 2 tablets by mouth daily.    . TURMERIC PO Take 7 drops by mouth daily.    Marland Kitchen UNABLE TO FIND Take 6 tablets by mouth daily. Med Name: Vitamin B 17    . UNABLE TO FIND Take 10 drops by mouth daily. Med Name: Liquid Vitamin A    . UNABLE TO  FIND Med Name: Hydrex 2 vials daily    . UNABLE TO FIND Med Name: [id number] 3 every other day    . UNABLE TO FIND Med Name: Hemp Oil Complete 2 daily    . UNABLE TO FIND Med Name: Artemisinin Complex 2 daily    . UNABLE TO FIND Med Name: Adrena Calm 1/4 pump daily    . UNABLE TO FIND Med Name: Immuno Plus 3 drops daily    . UNABLE TO FIND Med Name: Rockne Menghini Glutathione 5 tsp daily    . UNABLE TO FIND Med Name: Vitamin C Drip 20,000 once weekly    . UNABLE TO FIND Med Name: Myrrh 3 drops daily    . UNABLE TO FIND Med Name: Welford Roche Vite 6 per day    . UNABLE TO FIND Med Name: Pea Protein Isolate 1 scoop per day    . UNABLE TO FIND Med Name: Alen Blew 1 scoop per day    . Na Sulfate-K Sulfate-Mg Sulf 17.5-3.13-1.6 GM/177ML SOLN Take 1 kit by mouth once for 1 dose. 354 mL 0   No current facility-administered medications for this visit.    Patient confirms/reports the following allergies:  Allergies  Allergen Reactions  . Sulfa Antibiotics Swelling  . Tramadol Other (See Comments)    Other reaction(s): Hallucination    No orders of the defined types were placed in this encounter.   AUTHORIZATION INFORMATION Primary Insurance: 1D#:  Group #:  Secondary Insurance: 1D#: Group #:  SCHEDULE INFORMATION: Date: 04/17/20 Time: Location: [facility]

## 2020-04-13 ENCOUNTER — Telehealth: Payer: Self-pay | Admitting: Gastroenterology

## 2020-04-13 NOTE — Telephone Encounter (Signed)
Patient states she has to go out of state for a family problem and needs to cancel her procedure with Dr. Maximino Greenland for the time being. Pt will cb to resch when she returns. Pt had screening call [phone] and is scheduled with Dr. Karie Schwalbe this Friday 1.7.22.

## 2020-04-13 NOTE — Telephone Encounter (Signed)
Returned patients call in regards to canceling her colonoscopy.  I asked if she would like to reschedule she stated that she will just call back in a few weeks due to some other matters came up that she has to handle.  Darel Hong in Endo has been notified of cancellation.  Referral updated.  Thanks,  [place], [place]

## 2020-04-15 ENCOUNTER — Other Ambulatory Visit: Admission: RE | Admit: 2020-04-15 | Payer: Medicare HMO | Source: Ambulatory Visit

## 2020-04-17 ENCOUNTER — Ambulatory Visit: Admission: RE | Admit: 2020-04-17 | Payer: Medicare HMO | Source: Home / Self Care | Admitting: Gastroenterology

## 2020-04-17 ENCOUNTER — Encounter: Admission: RE | Payer: Self-pay | Source: Home / Self Care

## 2020-04-17 SURGERY — COLONOSCOPY WITH PROPOFOL
Anesthesia: General

## 2020-04-27 ENCOUNTER — Other Ambulatory Visit: Payer: Medicare HMO

## 2020-05-14 ENCOUNTER — Other Ambulatory Visit: Payer: Self-pay

## 2020-05-14 ENCOUNTER — Ambulatory Visit
Admission: RE | Admit: 2020-05-14 | Discharge: 2020-05-14 | Disposition: A | Discharge status: Home / Self Care | Payer: Medicare HMO | Source: Ambulatory Visit | Attending: Oncology | Admitting: Oncology

## 2020-05-14 DIAGNOSIS — Z5181 Encounter for therapeutic drug level monitoring: Secondary | ICD-10-CM

## 2020-05-14 DIAGNOSIS — Z853 Personal history of malignant neoplasm of breast: Secondary | ICD-10-CM | POA: Insufficient documentation

## 2020-05-14 DIAGNOSIS — Z08 Encounter for follow-up examination after completed treatment for malignant neoplasm: Secondary | ICD-10-CM | POA: Diagnosis present

## 2020-05-14 DIAGNOSIS — Z79811 Long term (current) use of aromatase inhibitors: Secondary | ICD-10-CM | POA: Diagnosis present

## 2020-06-04 ENCOUNTER — Inpatient Hospital Stay (HOSPITAL_BASED_OUTPATIENT_CLINIC_OR_DEPARTMENT_OTHER): Payer: Medicare HMO | Admitting: Oncology

## 2020-06-04 ENCOUNTER — Encounter: Payer: Self-pay | Admitting: Oncology

## 2020-06-04 ENCOUNTER — Ambulatory Visit: Payer: Medicare HMO | Admitting: Oncology

## 2020-06-04 ENCOUNTER — Inpatient Hospital Stay: Payer: Medicare HMO | Attending: Oncology

## 2020-06-04 ENCOUNTER — Other Ambulatory Visit: Payer: Medicare HMO

## 2020-06-04 VITALS — BP 130/65 | HR 62 | Temp 97.1°F | Wt 199.2 lb

## 2020-06-04 DIAGNOSIS — Z79899 Other long term (current) drug therapy: Secondary | ICD-10-CM | POA: Insufficient documentation

## 2020-06-04 DIAGNOSIS — Z853 Personal history of malignant neoplasm of breast: Secondary | ICD-10-CM | POA: Diagnosis not present

## 2020-06-04 DIAGNOSIS — Z9012 Acquired absence of left breast and nipple: Secondary | ICD-10-CM | POA: Diagnosis not present

## 2020-06-04 DIAGNOSIS — Z90721 Acquired absence of ovaries, unilateral: Secondary | ICD-10-CM | POA: Insufficient documentation

## 2020-06-04 DIAGNOSIS — Z79811 Long term (current) use of aromatase inhibitors: Secondary | ICD-10-CM | POA: Diagnosis not present

## 2020-06-04 DIAGNOSIS — C50912 Malignant neoplasm of unspecified site of left female breast: Secondary | ICD-10-CM | POA: Insufficient documentation

## 2020-06-04 DIAGNOSIS — Z8049 Family history of malignant neoplasm of other genital organs: Secondary | ICD-10-CM | POA: Insufficient documentation

## 2020-06-04 DIAGNOSIS — M85851 Other specified disorders of bone density and structure, right thigh: Secondary | ICD-10-CM | POA: Diagnosis not present

## 2020-06-04 DIAGNOSIS — Z08 Encounter for follow-up examination after completed treatment for malignant neoplasm: Secondary | ICD-10-CM

## 2020-06-04 DIAGNOSIS — Z170 Estrogen receptor positive status [ER+]: Secondary | ICD-10-CM | POA: Insufficient documentation

## 2020-06-04 DIAGNOSIS — Z801 Family history of malignant neoplasm of trachea, bronchus and lung: Secondary | ICD-10-CM | POA: Diagnosis not present

## 2020-06-04 DIAGNOSIS — Z803 Family history of malignant neoplasm of breast: Secondary | ICD-10-CM | POA: Diagnosis not present

## 2020-06-04 DIAGNOSIS — Z5181 Encounter for therapeutic drug level monitoring: Secondary | ICD-10-CM

## 2020-06-04 LAB — CBC WITH DIFFERENTIAL/PLATELET
Abs Immature Granulocytes: 0.01 10*3/uL (ref 0.00–0.07)
Basophils Absolute: 0 10*3/uL (ref 0.0–0.1)
Basophils Relative: 1 %
Eosinophils Absolute: 0.1 10*3/uL (ref 0.0–0.5)
Eosinophils Relative: 2 %
HCT: 37.1 % (ref 36.0–46.0)
Hemoglobin: 12.2 g/dL (ref 12.0–15.0)
Immature Granulocytes: 0 %
Lymphocytes Relative: 35 %
Lymphs Abs: 2.3 10*3/uL (ref 0.7–4.0)
MCH: 31.3 pg (ref 26.0–34.0)
MCHC: 32.9 g/dL (ref 30.0–36.0)
MCV: 95.1 fL (ref 80.0–100.0)
Monocytes Absolute: 0.4 10*3/uL (ref 0.1–1.0)
Monocytes Relative: 7 %
Neutro Abs: 3.7 10*3/uL (ref 1.7–7.7)
Neutrophils Relative %: 55 %
Platelets: 165 10*3/uL (ref 150–400)
RBC: 3.9 MIL/uL (ref 3.87–5.11)
RDW: 13.4 % (ref 11.5–15.5)
WBC: 6.5 10*3/uL (ref 4.0–10.5)
nRBC: 0 % (ref 0.0–0.2)

## 2020-06-04 LAB — COMPREHENSIVE METABOLIC PANEL
ALT: 18 U/L (ref 0–44)
AST: 27 U/L (ref 15–41)
Albumin: 4.7 g/dL (ref 3.5–5.0)
Alkaline Phosphatase: 68 U/L (ref 38–126)
Anion gap: 11 (ref 5–15)
BUN: 17 mg/dL (ref 8–23)
CO2: 25 mmol/L (ref 22–32)
Calcium: 9.4 mg/dL (ref 8.9–10.3)
Chloride: 106 mmol/L (ref 98–111)
Creatinine, Ser: 0.88 mg/dL (ref 0.44–1.00)
GFR, Estimated: >60 mL/min (ref >60)
Glucose, Bld: 89 mg/dL (ref 70–99)
Potassium: 3.6 mmol/L (ref 3.5–5.1)
Sodium: 142 mmol/L (ref 135–145)
Total Bilirubin: 1.1 mg/dL (ref 0.3–1.2)
Total Protein: 7.6 g/dL (ref 6.5–8.1)

## 2020-06-04 NOTE — Progress Notes (Signed)
Hematology/Oncology Consult note [facility]  [phone] [phone]  Patient Care Team: Virginia Crews, MD as PCP - General (Family Medicine) Johny Blamer, [place] (Chiropractic Medicine) Debbora Dus, NP (Nurse Practitioner) Paulene Floor as Physician Assistant (Physician Assistant) Sindy Guadeloupe, MD as Consulting Physician (Oncology)   Name of the patient: Meredith Hamilton  [id number]  Feb 10, 1951   Date of visit: 06/04/20  Diagnosis- h/o left breast cancer pathological prognostic stage IB T2N2M0 ER PR positive her 2 negative s/p mastectomy and axillary LN dissection  Chief complaint/ Reason for visit-routine follow-up of breast cancer  Heme/Onc history: Patient is a 70 year old female who was found to have an abnormal screening mammogram at an outside facility in [place] in February 2019. This was followed by a mastectomy and a sentinel lymph node biopsy in April 2019. Intraoperative frozen section of the lymph node was positive and this was converted to an axillary lymph node dissection. Final pathology revealed mixed pathology of invasive mammary as well as invasive lobular carcinoma. There were 2 foci measuring 2.1 and 0.8 cm. Overall grade 2. ER PR strongly positive greater than 90% and HER-2 was negative. 5 out of 15 axillary lymph nodes were positive for metastatic disease with the largest deposit being 1.8 cm and extranodal extension was present. Perineural invasion was identified. No LV I. Final margins were clear pathological stage T2N2A. She was recommended adjuvant chemotherapy and postmastectomy radiation. However patient did not want to go through it and eventually moved her care to [facility]. She has also been seen by surgical oncology in [facility] recently in July 2019. She has been pursuing alternative therapy with vitamin C infusions as well as other nutritional supplements.  Patient was started  on Arimidex in November 2019   Interval history-patient reports doing well overall.  She is tolerating Arimidex along with calcium and vitamin D well.  Denies any changes in her appetite or weight.  Denies any aches or pains anywhere.  ECOG PS- 1 Pain scale- 0   Review of systems- Review of Systems  Constitutional: Negative for chills, fever, malaise/fatigue and weight loss.  HENT: Negative for congestion, ear discharge and nosebleeds.   Eyes: Negative for blurred vision.  Respiratory: Negative for cough, hemoptysis, sputum production, shortness of breath and wheezing.   Cardiovascular: Negative for chest pain, palpitations, orthopnea and claudication.  Gastrointestinal: Negative for abdominal pain, blood in stool, constipation, diarrhea, heartburn, melena, nausea and vomiting.  Genitourinary: Negative for dysuria, flank pain, frequency, hematuria and urgency.  Musculoskeletal: Negative for back pain, joint pain and myalgias.  Skin: Negative for rash.  Neurological: Negative for dizziness, tingling, focal weakness, seizures, weakness and headaches.  Endo/Heme/Allergies: Does not bruise/bleed easily.  Psychiatric/Behavioral: Negative for depression and suicidal ideas. The patient does not have insomnia.       Allergies  Allergen Reactions   Sulfa Antibiotics Swelling   Tramadol Other (See Comments)    Other reaction(s): Hallucination     Past Medical History:  Diagnosis Date   Breast CA ([place])    Family history of breast cancer    Family history of lung cancer    Family history of throat cancer    Hashimoto's disease    Hypertension    Non-celiac gluten sensitivity      Past Surgical History:  Procedure Laterality Date   [place]   MASTECTOMY W/ SENTINEL NODE BIOPSY Left 2019   RIGHT  OOPHORECTOMY  1979   due to ovarian torsion   URETEROSCOPY Right 2017    Social History   Socioeconomic History   Marital  status: Divorced    Spouse name: Not on file   Number of children: 2   Years of education: 12   Highest education level: High school graduate  Occupational History   Occupation: retired Sales promotion account executive for [employer]  Tobacco Use   Smoking status: Never Smoker   Smokeless tobacco: Never Used  Scientific laboratory technician Use: Never used  Substance and Sexual Activity   Alcohol use: Not Currently   Drug use: Never   Sexual activity: Not Currently  Other Topics Concern   Not on file  Social History Narrative   Not on file   Social Determinants of Health   Financial Resource Strain: Low Risk    Difficulty of Paying Living Expenses: Not hard at all  Food Insecurity: No Food Insecurity   Worried About Charity fundraiser in the Last Year: Never true   Arboriculturist in the Last Year: Never true  Transportation Needs: No Transportation Needs   Lack of Transportation (Medical): No   Lack of Transportation (Non-Medical): No  Physical Activity: Inactive   Days of Exercise per Week: 0 days   Minutes of Exercise per Session: 0 min  Stress: No Stress Concern Present   Feeling of Stress : Not at all  Social Connections: Moderately Isolated   Frequency of Communication with Friends and Family: More than three times a week   Frequency of Social Gatherings with Friends and Family: More than three times a week   Attends Religious Services: More than 4 times per year   Active Member of [employer] or Organizations: No   Attends Music therapist: Never   Marital Status: Divorced  Human resources officer Violence: Not At Risk   Fear of Current or Ex-Partner: No   Emotionally Abused: No   Physically Abused: No   Sexually Abused: No    Family History  Problem Relation Age of Onset   Lung cancer Mother 35       hx smoking   Lung cancer Father 40       hx smoking   Breast cancer Sister 34       in remission, now in mid 56s   Lung cancer Brother 2        smoker   Throat cancer Brother        alcohol   Colon cancer Neg Hx    Cervical cancer Neg Hx    Ovarian cancer Neg Hx      Current Outpatient Medications:    amLODipine (NORVASC) 10 MG tablet, Take 1 tablet (10 mg total) by mouth daily., Disp: 90 tablet, Rfl: 3   anastrozole (ARIMIDEX) 1 MG tablet, TAKE 1 TABLET(1 MG) BY MOUTH DAILY, Disp: 90 tablet, Rfl: 3   Cholecalciferol (VITAMIN D3) LIQD, Take 6 drops by mouth daily., Disp: , Rfl:    Homeopathic Products (FRANKINCENSE UPLIFTING) OIL, Take 7 drops by mouth daily., Disp: , Rfl:    Misc Natural Products (SPLEEN) CAPS, Take 2 capsules by mouth daily., Disp: , Rfl:    OVER THE COUNTER MEDICATION, Take 4 Scoops by mouth daily. Pecta Sol-C (Mod. Citrus Pectin), Disp: , Rfl:    OVER THE COUNTER MEDICATION, Take 2 Scoops by mouth daily. Fermented Mushroom Blend, Disp: , Rfl:    OVER THE COUNTER MEDICATION, Take 6 capsules by mouth daily.  Cruciferous Complete, Disp: , Rfl:    OVER THE COUNTER MEDICATION, Nitric Balance 4 tsp daily, Disp: , Rfl:    Specialty Vitamins Products (MAMMARY) TABS, Take 2 tablets by mouth daily., Disp: , Rfl:    TURMERIC PO, Take 7 drops by mouth daily., Disp: , Rfl:    UNABLE TO FIND, Take 6 tablets by mouth daily. Med Name: Vitamin B 17, Disp: , Rfl:    UNABLE TO FIND, Take 10 drops by mouth daily. Med Name: Liquid Vitamin A, Disp: , Rfl:    UNABLE TO FIND, Med Name: Hydrex 2 vials daily, Disp: , Rfl:    UNABLE TO FIND, Med Name: Proglyco-SP 3 every other day, Disp: , Rfl:    UNABLE TO FIND, Med Name: Hemp Oil Complete 2 daily, Disp: , Rfl:    UNABLE TO FIND, Med Name: Artemisinin Complex 2 daily, Disp: , Rfl:    UNABLE TO FIND, Med Name: Adrena Calm 1/4 pump daily, Disp: , Rfl:    UNABLE TO FIND, Med Name: Immuno Plus 3 drops daily, Disp: , Rfl:    UNABLE TO FIND, Med Name: Trizomal Glutathione 5 tsp daily, Disp: , Rfl:    UNABLE TO FIND, Med Name: Vitamin C Drip 20,000 once weekly,  Disp: , Rfl:    UNABLE TO FIND, Med Name: Myrrh 3 drops daily, Disp: , Rfl:    UNABLE TO FIND, Med Name: Entro Vite 6 per day, Disp: , Rfl:    UNABLE TO FIND, Med Name: Pea Protein Isolate 1 scoop per day, Disp: , Rfl:    UNABLE TO FIND, Med Name: Nourish Greens 1 scoop per day, Disp: , Rfl:   Physical exam:  Vitals:   06/04/20 1429  BP: 130/65  Pulse: 62  Temp: (!) 97.1 F (36.2 C)  TempSrc: Tympanic  SpO2: 100%  Weight: 199 lb 3.2 oz (90.4 kg)   Physical Exam Constitutional:      General: She is not in acute distress. Eyes:     Extraocular Movements: EOM normal.  Cardiovascular:     Rate and Rhythm: Normal rate and regular rhythm.     Heart sounds: Normal heart sounds.  Pulmonary:     Effort: Pulmonary effort is normal.     Breath sounds: Normal breath sounds.  Abdominal:     General: Bowel sounds are normal.     Palpations: Abdomen is soft.  Skin:    General: Skin is warm and dry.  Neurological:     Mental Status: She is alert and oriented to person, place, and time.   Patient is s/p left mastectomy without reconstruction.  No evidence of left chest wall recurrence.  No palpable masses in the right breast.  No palpable bilateral axillary adenopathy.  CMP Latest Ref Rng & Units 06/04/2020  Glucose 70 - 99 mg/dL 89  BUN 8 - 23 mg/dL 17  Creatinine 0.44 - 1.00 mg/dL 0.88  Sodium 135 - 145 mmol/L 142  Potassium 3.5 - 5.1 mmol/L 3.6  Chloride 98 - 111 mmol/L 106  CO2 22 - 32 mmol/L 25  Calcium 8.9 - 10.3 mg/dL 9.4  Total Protein 6.5 - 8.1 g/dL 7.6  Total Bilirubin 0.3 - 1.2 mg/dL 1.1  Alkaline Phos 38 - 126 U/L 68  AST 15 - 41 U/L 27  ALT 0 - 44 U/L 18   CBC Latest Ref Rng & Units 06/04/2020  WBC 4.0 - 10.5 K/uL 6.5  Hemoglobin 12.0 - 15.0 g/dL 12.2  Hematocrit 36.0 - 46.0 %  37.1  Platelets 150 - 400 K/uL 165    No images are attached to the encounter.  DG Bone Density  Result Date: 05/14/2020 EXAM: DUAL X-RAY ABSORPTIOMETRY (DXA) FOR BONE MINERAL  DENSITY IMPRESSION: Your patient Julienne Vogler completed a BMD test on 05/14/2020 using the [place] (software version: 14.10) manufactured by [employer]. The following summarizes the results of our evaluation. Technologist:VLM PATIENT BIOGRAPHICAL: Name: Radha, Coggins Patient ID:  [id number] Birth Date: 21-Sep-1950 Height:     66.0 in. Gender:      Female Exam Date:  05/14/2020 Weight:     196.0 lbs. Indications: Breast CA, Caucasian, High Risk Meds, History of Breast Cancer, Oophorectomy Unilateral, Postmenopausal Fractures: Treatments: Anastrozole, Calcium, Vitamin D DENSITOMETRY RESULTS: Site         Region      Measured Date Measured Age WHO Classification Young Adult T-score BMD         %Change vs. Previous Significant Change (*) AP Spine L1-L4 (L3) 05/14/[age] 71.9 Normal 0.4 1.229 g/cm2 -12.1% Yes AP Spine L1-L4 (L3) 04[age]23/2018 69.8 Normal 1.7 1.398 g/cm2 - - DualFemur Total Right 05/14/2020 71.9 Osteopenia -1.4 0.832 g/cm2 -3.3% Yes DualFemur Total Right 04/23/2018 69.8 Osteopenia -1.2 0.860 g/cm2 - - DualFemur Total Mean 05/14/2020 71.9 Osteopenia -1.1 0.871 g/cm2 -3.9% Yes DualFemur Total Mean 04/23/2018 69.8 Normal -0.8 0.906 g/cm2 - - Left Forearm Radius 33% 05/14/2020 71.9 Osteopenia -1.5 0.745 g/cm2 -6.9% Yes Left Forearm Radius 33% 04/23/2018 69.8 Normal -0.9 0.800 g/cm2 - - ASSESSMENT: The BMD measured at Forearm Radius 33% is 0.745 g/cm2 with a T-score of -1.5. This patient is considered OSTEOPENIC according to [facility]) criteria. Compared with prior study, there has been significant decrease in the spine. Compared with prior study, there has been significant decrease in the total hip. L- 3 was excluded due to degenerative changes. The scan quality is good. World Pharmacologist [facility]) criteria for post-menopausal, Caucasian Women: Normal:                   T-score at or above -1 SD Osteopenia/low bone mass: T-score between -1 and -2.5 SD Osteoporosis:              T-score at or below -2.5 SD RECOMMENDATIONS: 1. All patients should optimize calcium and vitamin D intake. 2. Consider FDA-approved medical therapies in postmenopausal women and men aged 15 years and older, based on the following: a. A hip or vertebral(clinical or morphometric) fracture b. T-score < -2.5 at the femoral neck or spine after appropriate evaluation to exclude secondary causes c. Low bone mass (T-score between -1.0 and -2.5 at the femoral neck or spine) and a 10-year probability of a hip fracture > 3% or a 10-year probability of a major osteoporosis-related fracture > 20% based on the [place]-adapted WHO algorithm 3. Clinician judgment and/or patient preferences may indicate treatment for people with 10-year fracture probabilities above or below these levels FOLLOW-UP: People with diagnosed cases of osteoporosis or at high risk for fracture should have regular bone mineral density tests. For patients eligible for Medicare, routine testing is allowed once every 2 years. The testing frequency can be increased to one year for patients who have rapidly progressing disease, those who are receiving or discontinuing medical therapy to restore bone mass, or have additional risk factors. I have reviewed this report, and agree with the above findings. Mark A. Thornton Papas, M.D. [facility] Dear [place]on Anna Gaylin Bulthuis, Your patient Preslee Regas completed  a FRAX assessment on 05/14/2020 using the [place] (analysis version: 14.10) manufactured by [employer]. The following summarizes the results of our evaluation. PATIENT BIOGRAPHICAL: Name: Varsha, Knock Patient ID: [id number] Birth Date: 04/07/1951 Height:    66.0 in. Gender:     Female    [age]       Weight:    196.0 lbs. Ethnicity:  White                            Exam Date: 05/14/2020 FRAX* RESULTS:  (version: 3.5) 10-year Probability of Fracture Major Osteoporotic Fracture  8.8% Hip Fracture  1.0% Population: [place] (Caucasian) Risk  Factors: None Based on Femur (Left) Neck BMD 1 -The 10-year probability of fracture may be lower than reported if the patient has received treatment. 2 -Major Osteoporotic Fracture: Clinical Spine, Forearm, Hip or Shoulder *FRAX is a Materials engineer of the [employer] for Metabolic Bone Disease, a [place] (WHO) [facility]. ASSESSMENT: The probability of a major osteoporotic fracture is 8.8% within the next ten years. The probability of a hip fracture is 1.0% within the next ten years. I have reviewed this report and agree with the above findings. Mark A. Thornton Papas, M.D. [facility] Electronically Signed   By: Lavonia Dana M.D.   On: 05/14/2020 11:24     Assessment and plan- Patient is a 70 y.o. female with history of stage Ib pathological prognostic stage T2N2A grade 2 invasive mammary carcinoma with mixed ductal and lobular features ER and PR greater than 90% positive and HER-2/neu negative status post left mastectomy and axillary lymph node dissection.She refused adjuvant chemotherapy and radiation and is currently on Arimidex.  This is a routine follow-up visit for breast cancer  Clinically patient is doing well with no concerning signs and symptoms of recurrence based on today's exam.  She is tolerating Arimidex calcium and vitamin D well and will continue taking that for 10 years.  Patient is scheduled for a mammogram at [facility] next month.  Discuss results of recent bone density scan which shows mild worsening of her T-scores but not enough to start bisphosphonates at this time.  She will continue taking calcium and vitamin D for that.  I will see her back in 6 months no labs for in person visit   Visit Diagnosis 1. Encounter for follow-up surveillance of breast cancer      Dr. Randa Evens, MD, MPH [facility] [phone] 06/04/2020 3:21 PM

## 2020-07-31 ENCOUNTER — Other Ambulatory Visit: Payer: Self-pay | Admitting: Oncology

## 2020-11-12 ENCOUNTER — Telehealth: Payer: Self-pay | Admitting: Oncology

## 2020-11-12 NOTE — Telephone Encounter (Signed)
Left a VM for patient to make her aware of new appointment time (MD not in office). Mailing updated reminder.

## 2020-12-03 ENCOUNTER — Ambulatory Visit: Payer: Medicare HMO | Admitting: Oncology

## 2020-12-21 ENCOUNTER — Encounter: Payer: Self-pay | Admitting: Oncology

## 2020-12-21 ENCOUNTER — Inpatient Hospital Stay: Payer: Medicare HMO | Attending: Oncology | Admitting: Oncology

## 2020-12-21 VITALS — BP 152/78 | HR 61 | Temp 98.5°F | Resp 18 | Wt 204.0 lb

## 2020-12-21 DIAGNOSIS — C50912 Malignant neoplasm of unspecified site of left female breast: Secondary | ICD-10-CM | POA: Insufficient documentation

## 2020-12-21 DIAGNOSIS — Z90721 Acquired absence of ovaries, unilateral: Secondary | ICD-10-CM | POA: Insufficient documentation

## 2020-12-21 DIAGNOSIS — Z79899 Other long term (current) drug therapy: Secondary | ICD-10-CM | POA: Insufficient documentation

## 2020-12-21 DIAGNOSIS — Z5181 Encounter for therapeutic drug level monitoring: Secondary | ICD-10-CM

## 2020-12-21 DIAGNOSIS — Z9012 Acquired absence of left breast and nipple: Secondary | ICD-10-CM | POA: Insufficient documentation

## 2020-12-21 DIAGNOSIS — Z8049 Family history of malignant neoplasm of other genital organs: Secondary | ICD-10-CM | POA: Insufficient documentation

## 2020-12-21 DIAGNOSIS — M25559 Pain in unspecified hip: Secondary | ICD-10-CM | POA: Diagnosis not present

## 2020-12-21 DIAGNOSIS — Z801 Family history of malignant neoplasm of trachea, bronchus and lung: Secondary | ICD-10-CM | POA: Diagnosis not present

## 2020-12-21 DIAGNOSIS — M85851 Other specified disorders of bone density and structure, right thigh: Secondary | ICD-10-CM | POA: Insufficient documentation

## 2020-12-21 DIAGNOSIS — Z79811 Long term (current) use of aromatase inhibitors: Secondary | ICD-10-CM | POA: Insufficient documentation

## 2020-12-21 DIAGNOSIS — Z08 Encounter for follow-up examination after completed treatment for malignant neoplasm: Secondary | ICD-10-CM | POA: Diagnosis not present

## 2020-12-21 DIAGNOSIS — Z853 Personal history of malignant neoplasm of breast: Secondary | ICD-10-CM

## 2020-12-21 DIAGNOSIS — Z803 Family history of malignant neoplasm of breast: Secondary | ICD-10-CM | POA: Insufficient documentation

## 2020-12-21 DIAGNOSIS — Z170 Estrogen receptor positive status [ER+]: Secondary | ICD-10-CM | POA: Insufficient documentation

## 2020-12-21 NOTE — Progress Notes (Signed)
Hematology/Oncology Consult note [facility]  Telephone:([phone] [phone]  Patient Care Team: Virginia Crews, MD as PCP - General (Family Medicine) Johny Blamer, [place] (Chiropractic Medicine) Debbora Dus, NP (Nurse Practitioner) Paulene Floor as Physician Assistant (Physician Assistant) Sindy Guadeloupe, MD as Consulting Physician (Oncology)   Name of the patient: Meredith Hamilton  [id number]  [birth year]   Date of visit: 12/21/20  Diagnosis- h/o left breast cancer pathological prognostic stage IB T2N2M0 ER PR positive her 2 negative s/p mastectomy and axillary LN dissection  Chief complaint/ Reason for visit-routine follow-up of breast cancer  Heme/Onc history: Patient is a 70 year old female who was found to have an abnormal screening mammogram at an outside facility in [place] in February 2019.  This was followed by a mastectomy and a sentinel lymph node biopsy in April 2019.  Intraoperative frozen section of the lymph node was positive and this was converted to an axillary lymph node dissection.  Final pathology revealed mixed pathology of invasive mammary as well as invasive lobular carcinoma.  There were 2 foci measuring 2.1 and 0.8 cm.  Overall grade 2.  ER PR strongly positive greater than 90% and HER-2 was negative.  5 out of 15 axillary lymph nodes were positive for metastatic disease with the largest deposit being 1.8 cm and extranodal extension was present.  Perineural invasion was identified.  No LV I.  Final margins were clear pathological stage T2N2A.  She was recommended adjuvant chemotherapy and postmastectomy radiation.  However patient did not want to go through it and eventually moved her care to [facility].  She has also been seen by surgical oncology in [facility] recently in July 2019.  She has been pursuing alternative therapy with vitamin C infusions as well as other nutritional supplements.     Patient was started  on Arimidex in November 2019    Interval history-patient reports that she has been compliant with Arimidex and she has been taking it every day without missing any doses.  Overall appetite and weight have remained stable.  Reports bilateral hip pain which has been mild in the past but has been particularly worse in the last 2 weeks.  ECOG PS- 1 Pain scale- 4 Opioid associated constipation- no  Review of systems- Review of Systems  Constitutional:  Negative for chills, fever, malaise/fatigue and weight loss.  HENT:  Negative for congestion, ear discharge and nosebleeds.   Eyes:  Negative for blurred vision.  Respiratory:  Negative for cough, hemoptysis, sputum production, shortness of breath and wheezing.   Cardiovascular:  Negative for chest pain, palpitations, orthopnea and claudication.  Gastrointestinal:  Negative for abdominal pain, blood in stool, constipation, diarrhea, heartburn, melena, nausea and vomiting.  Genitourinary:  Negative for dysuria, flank pain, frequency, hematuria and urgency.  Musculoskeletal:  Negative for back pain, joint pain and myalgias.  Skin:  Negative for rash.  Neurological:  Negative for dizziness, tingling, focal weakness, seizures, weakness and headaches.  Endo/Heme/Allergies:  Does not bruise/bleed easily.  Psychiatric/Behavioral:  Negative for depression and suicidal ideas. The patient does not have insomnia.      Allergies  Allergen Reactions   Sulfa Antibiotics Swelling   Tramadol Other (See Comments)    Other reaction(s): Hallucination     Past Medical History:  Diagnosis Date   Breast CA ([place])    Family history of breast cancer    Family history of lung cancer    Family history of throat cancer  Hashimoto's disease    Hypertension    Non-celiac gluten sensitivity      Past Surgical History:  Procedure Laterality Date   [place]   MASTECTOMY W/ SENTINEL NODE BIOPSY Left 2019   RIGHT  OOPHORECTOMY  1979   due to ovarian torsion   URETEROSCOPY Right 2017    Social History   Socioeconomic History   Marital status: Divorced    Spouse name: Not on file   Number of children: 2   Years of education: 12   Highest education level: High school graduate  Occupational History   Occupation: retired Sales promotion account executive for [employer]  Tobacco Use   Smoking status: Never   Smokeless tobacco: Never  Scientific laboratory technician Use: Never used  Substance and Sexual Activity   Alcohol use: Not Currently   Drug use: Never   Sexual activity: Not Currently  Other Topics Concern   Not on file  Social History Narrative   Not on file   Social Determinants of Health   Financial Resource Strain: Low Risk    Difficulty of Paying Living Expenses: Not hard at all  Food Insecurity: No Food Insecurity   Worried About Charity fundraiser in the Last Year: Never true   [place] in the Last Year: Never true  Transportation Needs: No Transportation Needs   Lack of Transportation (Medical): No   Lack of Transportation (Non-Medical): No  Physical Activity: Inactive   Days of Exercise per Week: 0 days   Minutes of Exercise per Session: 0 min  Stress: No Stress Concern Present   Feeling of Stress : Not at all  Social Connections: Moderately Isolated   Frequency of Communication with Friends and Family: More than three times a week   Frequency of Social Gatherings with Friends and Family: More than three times a week   Attends Religious Services: More than 4 times per year   Active Member of [employer] or Organizations: No   Attends Music therapist: Never   Marital Status: Divorced  Human resources officer Violence: Not At Risk   Fear of Current or Ex-Partner: No   Emotionally Abused: No   Physically Abused: No   Sexually Abused: No    Family History  Problem Relation Age of Onset   Lung cancer Mother 86       hx smoking   Lung cancer Father 44       hx smoking   Breast  cancer Sister 53       in remission, now in mid 65s   Lung cancer Brother 109       smoker   Throat cancer Brother        alcohol   Colon cancer Neg Hx    Cervical cancer Neg Hx    Ovarian cancer Neg Hx      Current Outpatient Medications:    amLODipine (NORVASC) 10 MG tablet, Take 1 tablet (10 mg total) by mouth daily., Disp: 90 tablet, Rfl: 3   anastrozole (ARIMIDEX) 1 MG tablet, TAKE 1 TABLET(1 MG) BY MOUTH DAILY, Disp: 90 tablet, Rfl: 3   Cholecalciferol (VITAMIN D3) LIQD, Take 6 drops by mouth daily., Disp: , Rfl:    Homeopathic Products (FRANKINCENSE UPLIFTING) OIL, Take 7 drops by mouth daily., Disp: , Rfl:    Misc Natural Products (SPLEEN) CAPS, Take 2 capsules by mouth daily., Disp: , Rfl:    OVER THE COUNTER MEDICATION,  Take 4 Scoops by mouth daily. Pecta Sol-C (Mod. Citrus Pectin), Disp: , Rfl:    OVER THE COUNTER MEDICATION, Take 2 Scoops by mouth daily. Fermented Mushroom Blend, Disp: , Rfl:    OVER THE COUNTER MEDICATION, Take 6 capsules by mouth daily. Cruciferous Complete, Disp: , Rfl:    OVER THE COUNTER MEDICATION, Nitric Balance 4 tsp daily, Disp: , Rfl:    TURMERIC PO, Take 7 drops by mouth daily., Disp: , Rfl:    UNABLE TO FIND, Take 6 tablets by mouth daily. Med Name: Vitamin B 17, Disp: , Rfl:    UNABLE TO FIND, Take 10 drops by mouth daily. Med Name: Liquid Vitamin A, Disp: , Rfl:    UNABLE TO FIND, Med Name: Hydrex 2 vials daily, Disp: , Rfl:    UNABLE TO FIND, Med Name: Proglyco-SP 3 every other day, Disp: , Rfl:    UNABLE TO FIND, Med Name: Hemp Oil Complete 2 daily, Disp: , Rfl:    UNABLE TO FIND, Med Name: Artemisinin Complex 2 daily, Disp: , Rfl:    UNABLE TO FIND, Med Name: Adrena Calm 1/4 pump daily, Disp: , Rfl:    UNABLE TO FIND, Med Name: Immuno Plus 3 drops daily, Disp: , Rfl:    UNABLE TO FIND, Med Name: Trizomal Glutathione 5 tsp daily, Disp: , Rfl:    UNABLE TO FIND, Med Name: Vitamin C Drip 20,000 once weekly, Disp: , Rfl:    UNABLE TO FIND, Med  Name: Myrrh 3 drops daily, Disp: , Rfl:    UNABLE TO FIND, Med Name: Welford Roche Vite 6 per day, Disp: , Rfl:    UNABLE TO FIND, Med Name: Pea Protein Isolate 1 scoop per day, Disp: , Rfl:    Specialty Vitamins Products (MAMMARY) TABS, Take 2 tablets by mouth daily., Disp: , Rfl:    UNABLE TO FIND, Med Name: Alen Blew 1 scoop per day, Disp: , Rfl:   Physical exam:  Vitals:   12/21/20 1129  BP: (!) 152/78  Pulse: 61  Resp: 18  Temp: 98.5 F (36.9 C)  SpO2: 98%  Weight: 204 lb (92.5 kg)   Physical Exam Constitutional:      General: She is not in acute distress. Cardiovascular:     Rate and Rhythm: Normal rate and regular rhythm.     Heart sounds: Normal heart sounds.  Pulmonary:     Effort: Pulmonary effort is normal.     Breath sounds: Normal breath sounds.  Abdominal:     General: Bowel sounds are normal.     Palpations: Abdomen is soft.  Musculoskeletal:        General: No swelling, tenderness or deformity.  Skin:    General: Skin is warm and dry.  Neurological:     Mental Status: She is alert and oriented to person, place, and time.  Breast exam: Patient is s/p left mastectomy without reconstruction.  No evidence of chest wall recurrence.  No palpable bilateral axillary adenopathy.  No palpable masses in the right breast.  CMP Latest Ref Rng & Units 06/04/2020  Glucose 70 - 99 mg/dL 89  BUN 8 - 23 mg/dL 17  Creatinine 0.44 - 1.00 mg/dL 0.88  Sodium 135 - 145 mmol/L 142  Potassium 3.5 - 5.1 mmol/L 3.6  Chloride 98 - 111 mmol/L 106  CO2 22 - 32 mmol/L 25  Calcium 8.9 - 10.3 mg/dL 9.4  Total Protein 6.5 - 8.1 g/dL 7.6  Total Bilirubin 0.3 - 1.2 mg/dL 1.1  Alkaline Phos 38 - 126 U/L 68  AST 15 - 41 U/L 27  ALT 0 - 44 U/L 18   CBC Latest Ref Rng & Units 06/04/2020  WBC 4.0 - 10.5 K/uL 6.5  Hemoglobin 12.0 - 15.0 g/dL 12.2  Hematocrit 36.0 - 46.0 % 37.1  Platelets 150 - 400 K/uL 165    Assessment and plan- Patient is a 70 y.o. female with history of stage Ib  pathological prognostic stage T2N2A grade 2 invasive mammary carcinoma with mixed ductal and lobular features ER and PR greater than 90% positive and HER-2/neu negative status post left mastectomy and axillary lymph node dissection.  She refused adjuvant chemotherapy and radiation treatment.  She is currently on Arimidex and this is a routine follow-up visit  Clinically patient is doing well with no breast concerns and an unrevealing breast exam.  She has ongoing bilateral hip pain and I will proceed with a bone scan at this time to rule out metastatic disease.  We could be dealing with osteoarthritis.  Continue Arimidex along with calcium and vitamin D.  Recent breast mammogram from March 2022 did not reveal any evidence of malignancy.  I will see her back in 6 months no labs were potentially sooner if there are any concerning findings on her bone scan   Visit Diagnosis 1. Hip pain   2. Visit for monitoring Arimidex therapy   3. Encounter for follow-up surveillance of breast cancer      Dr. Randa Evens, MD, MPH [facility] [phone] 12/21/2020 3:40 PM

## 2021-01-06 ENCOUNTER — Encounter
Admission: RE | Admit: 2021-01-06 | Discharge: 2021-01-06 | Disposition: A | Discharge status: Home / Self Care | Payer: Medicare HMO | Source: Ambulatory Visit | Attending: Oncology | Admitting: Oncology

## 2021-01-06 ENCOUNTER — Other Ambulatory Visit: Payer: Self-pay

## 2021-01-06 DIAGNOSIS — M25559 Pain in unspecified hip: Secondary | ICD-10-CM | POA: Insufficient documentation

## 2021-01-06 MED ORDER — TECHNETIUM TC 99M MEDRONATE IV KIT
20.0000 | PACK | Freq: Once | INTRAVENOUS | Status: AC | PRN
  Administered 2021-01-06: 20.4 via INTRAVENOUS

## 2021-01-29 ENCOUNTER — Telehealth: Payer: Self-pay | Admitting: Family Medicine

## 2021-01-29 MED ORDER — AMLODIPINE BESYLATE 10 MG PO TABS: 10.0000 mg | ORAL_TABLET | Freq: Every day | ORAL | 0 refills | Status: DC

## 2021-01-29 NOTE — Telephone Encounter (Signed)
[facility] faxed refill request for the following medications: ° °amLODipine (NORVASC) 10 MG tablet  ° ° °Please advise. °

## 2021-03-02 ENCOUNTER — Other Ambulatory Visit: Payer: Self-pay

## 2021-03-02 ENCOUNTER — Ambulatory Visit (INDEPENDENT_AMBULATORY_CARE_PROVIDER_SITE_OTHER): Payer: Medicare HMO

## 2021-03-02 VITALS — BP 138/60 | HR 73 | Temp 97.7°F | Ht 66.0 in | Wt 202.4 lb

## 2021-03-02 DIAGNOSIS — Z0000 Encounter for general adult medical examination without abnormal findings: Secondary | ICD-10-CM

## 2021-03-02 NOTE — Progress Notes (Signed)
Subjective:   Meredith Hamilton is a 70 y.o. female who presents for Medicare Annual (Subsequent) preventive examination.  Review of Systems           Objective:    Today's Vitals   03/02/21 1016 03/02/21 1023  BP: 138/60   Pulse: 73   Temp: 97.7 F (36.5 C)   TempSrc: Oral   SpO2: 97%   Weight: 202 lb 6.4 oz (91.8 kg)   Height: 5\' 6"  (1.676 m)   PainSc:  6    Body mass index is 32.67 kg/m.  Advanced Directives 03/02/2021 12/21/2020 06/04/2020 02/27/2020 12/02/2019 04/29/2019 02/18/2019  Does Patient Have a Medical Advance Directive? No No No No No No No  Would patient like information on creating a medical advance directive? No - Patient declined No - Patient declined - No - Patient declined - No - Patient declined No - Patient declined    Current Medications (verified) Outpatient Encounter Medications as of 03/02/2021  Medication Sig   amLODipine (NORVASC) 10 MG tablet Take 1 tablet (10 mg total) by mouth daily. Please schedule office visit before any future refills.   anastrozole (ARIMIDEX) 1 MG tablet TAKE 1 TABLET(1 MG) BY MOUTH DAILY   Cholecalciferol (VITAMIN D3) LIQD Take 6 drops by mouth daily.   OVER THE COUNTER MEDICATION Take 4 Scoops by mouth daily. Pecta Sol-C (Mod. Citrus Pectin)   OVER THE COUNTER MEDICATION Take 2 Scoops by mouth daily. Fermented Mushroom Blend   OVER THE COUNTER MEDICATION Take 6 capsules by mouth daily. Cruciferous Complete   OVER THE COUNTER MEDICATION Nitric Balance 4 tsp daily   Specialty Vitamins Products (MAMMARY) TABS Take 2 tablets by mouth daily.   UNABLE TO FIND Take 6 tablets by mouth daily. Med Name: Vitamin B 17   UNABLE TO FIND Take 10 drops by mouth daily. Med Name: Liquid Vitamin A   UNABLE TO FIND Med Name: Hydrex 2 vials daily   UNABLE TO FIND Med Name: Adrena Calm 1/4 pump daily   UNABLE TO FIND Med Name: Vitamin C Drip 20,000 once weekly   UNABLE TO FIND Med Name: Welford Roche Vite 6 per day   Homeopathic Products (FRANKINCENSE  UPLIFTING) OIL Take 7 drops by mouth daily. (Patient not taking: Reported on 03/02/2021)   Misc Natural Products (SPLEEN) CAPS Take 2 capsules by mouth daily. (Patient not taking: Reported on 03/02/2021)   TURMERIC PO Take 7 drops by mouth daily. (Patient not taking: Reported on 03/02/2021)   [place] Name: Proglyco-SP 3 every other day (Patient not taking: Reported on 03/02/2021)   [place] Med Name: Hemp Oil Complete 2 daily (Patient not taking: Reported on 03/02/2021)   [place] Med Name: Artemisinin Complex 2 daily (Patient not taking: Reported on 03/02/2021)   [place] Med Name: Immuno Plus 3 drops daily   [place] Name: Rockne Menghini Glutathione 5 tsp daily   [place] Name: Myrrh 3 drops daily (Patient not taking: Reported on 03/02/2021)   UNABLE TO FIND Med Name: Pea Protein Isolate 1 scoop per day (Patient not taking: Reported on 03/02/2021)   UNABLE TO FIND Med Name: Alen Blew 1 scoop per day (Patient not taking: Reported on 03/02/2021)   No facility-administered encounter medications on file as of 03/02/2021.    Allergies (verified) Sulfa antibiotics and Tramadol   History: Past Medical History:  Diagnosis Date   Breast CA ([place])    Family history of breast cancer  Family history of lung cancer    Family history of throat cancer    Hashimoto's disease    Hypertension    Non-celiac gluten sensitivity    Past Surgical History:  Procedure Laterality Date   APPENDECTOMY  1979   LUMBAR [place]   MASTECTOMY W/ SENTINEL NODE BIOPSY Left 2019   RIGHT OOPHORECTOMY  1979   due to ovarian torsion   URETEROSCOPY Right 2017   Family History  Problem Relation Age of Onset   Lung cancer Mother 85       hx smoking   Lung cancer Father 44       hx smoking   Breast cancer Sister 71       in remission, now in mid 31s   Lung cancer Brother 74       smoker   Throat cancer Brother        alcohol   Colon cancer Neg Hx     Cervical cancer Neg Hx    Ovarian cancer Neg Hx    Social History   Socioeconomic History   Marital status: Divorced    Spouse name: Not on file   Number of children: 2   Years of education: 12   Highest education level: High school graduate  Occupational History   Occupation: retired Sales promotion account executive for [employer]  Tobacco Use   Smoking status: Never   Smokeless tobacco: Never  Scientific laboratory technician Use: Never used  Substance and Sexual Activity   Alcohol use: Not Currently   Drug use: Never   Sexual activity: Not Currently  Other Topics Concern   Not on file  Social History Narrative   Not on file   Social Determinants of Health   Financial Resource Strain: Low Risk    Difficulty of Paying Living Expenses: Not very hard  Food Insecurity: No Food Insecurity   Worried About Charity fundraiser in the Last Year: Never true   [place] in the Last Year: Never true  Transportation Needs: No Transportation Needs   Lack of Transportation (Medical): No   Lack of Transportation (Non-Medical): No  Physical Activity: Insufficiently Active   Days of Exercise per Week: 3 days   Minutes of Exercise per Session: 30 min  Stress: No Stress Concern Present   Feeling of Stress : Only a little  Social Connections: Moderately Isolated   Frequency of Communication with Friends and Family: Three times a week   Frequency of Social Gatherings with Friends and Family: More than three times a week   Attends Religious Services: More than 4 times per year   Active Member of [employer] or Organizations: No   Attends Music therapist: Never   Marital Status: Divorced    Tobacco Counseling Counseling given: Not Answered   Clinical Intake:  Pre-visit preparation completed: Yes  Pain : 0-10 Pain Score: 6  Pain Type: Chronic pain Pain Location: Arm Pain Orientation: Left Pain Descriptors / Indicators: Sharp, Aching Pain Onset: More than a month ago Pain Frequency:  Several days a week     Nutritional Status: BMI > 30  Obese Nutritional Risks: None Diabetes: No  How often do you need to have someone help you when you read instructions, pamphlets, or other written materials from your doctor or pharmacy?: 1 - Never   Interpreter Needed?: No  Information entered by :: Kirke Shaggy, LPN   Activities of Daily Living No flowsheet data found.  Patient  Care Team: Virginia Crews, MD as PCP - General (Family Medicine) Johny Blamer, [place] (Chiropractic Medicine) Debbora Dus, NP (Nurse Practitioner) Trinna Post, PA-C (Inactive) as Physician Assistant (Physician Assistant) Sindy Guadeloupe, MD as Consulting Physician (Oncology)  Indicate any recent Medical Services you may have received from other than [facility] providers in the past year (date may be approximate).     Assessment:   This is a routine wellness examination for Taina.  Hearing/Vision screen No results found.  Dietary issues and exercise activities discussed:     Goals Addressed   None    Depression Screen PHQ 2/9 Scores 03/02/2021 02/27/2020 02/18/2019 01/25/2018 11/09/2017  PHQ - 2 Score 0 1 0 0 0    Fall Risk Fall Risk  03/02/2021 02/27/2020 02/18/2019 02/18/2019 01/25/2018  Falls in the past year? 0 0 0 0 No  Number falls in past yr: 0 0 0 0 -  Injury with Fall? 0 0 0 0 -  Risk for fall due to : No Fall Risks - - - -  Follow up Falls prevention discussed - - - -    FALL RISK PREVENTION PERTAINING TO THE HOME:  Any stairs in or around the home? Yes  If so, are there any without handrails? No  Home free of loose throw rugs in walkways, pet beds, electrical cords, etc? Yes  Adequate lighting in your home to reduce risk of falls? Yes   ASSISTIVE DEVICES UTILIZED TO PREVENT FALLS:  Life alert? No  Use of a cane, walker or w/c? No  Grab bars in the bathroom? Yes  Shower chair or bench in shower? No  Elevated toilet seat or a handicapped toilet? No   TIMED  UP AND GO:  Was the test performed? Yes .  Length of time to ambulate 10 feet: 8 sec.   Gait slow and steady without use of assistive device  Cognitive Function:     6CIT Screen 02/27/2020 02/18/2019 01/25/2018  What Year? 0 points 0 points 0 points  What month? 0 points 0 points 0 points  What time? 0 points 0 points 0 points  Count back from 20 0 points 0 points 0 points  Months in reverse 0 points 0 points 0 points  Repeat phrase 0 points 0 points 0 points  Total Score 0 0 0    Immunizations  There is no immunization history on file for this patient.  TDAP status: Due, Education has been provided regarding the importance of this vaccine. Advised may receive this vaccine at local pharmacy or Health Dept. Aware to provide a copy of the vaccination record if obtained from local pharmacy or Health Dept. Verbalized acceptance and understanding.  Flu Vaccine status: Declined, Education has been provided regarding the importance of this vaccine but patient still declined. Advised may receive this vaccine at local pharmacy or Health Dept. Aware to provide a copy of the vaccination record if obtained from local pharmacy or Health Dept. Verbalized acceptance and understanding.  Pneumococcal vaccine status: Declined,  Education has been provided regarding the importance of this vaccine but patient still declined. Advised may receive this vaccine at local pharmacy or Health Dept. Aware to provide a copy of the vaccination record if obtained from local pharmacy or Health Dept. Verbalized acceptance and understanding.   Covid-19 vaccine status: Declined, Education has been provided regarding the importance of this vaccine but patient still declined. Advised may receive this vaccine at local pharmacy or Health Dept.or vaccine clinic. Aware  to provide a copy of the vaccination record if obtained from local pharmacy or Health Dept. Verbalized acceptance and understanding.  Qualifies for Shingles  Vaccine? Yes   Zostavax completed No   Shingrix Completed?: No.    Education has been provided regarding the importance of this vaccine. Patient has been advised to call insurance company to determine out of pocket expense if they have not yet received this vaccine. Advised may also receive vaccine at local pharmacy or Health Dept. Verbalized acceptance and understanding.  Screening Tests Health Maintenance  Topic Date Due   COVID-19 Vaccine (1) Never done   Pneumonia Vaccine 73+ Years old (1 - PCV) Never done   Zoster Vaccines- Shingrix (1 of 2) Never done   COLONOSCOPY (Pts 45-19yrs Insurance coverage will need to be confirmed)  Never done   INFLUENZA VACCINE  Never done   TETANUS/TDAP  02/10/2023 (Originally 06/01/1969)   MAMMOGRAM  06/06/2021   DEXA SCAN  05/14/2022   Hepatitis C Screening  Completed   HPV VACCINES  Aged Out    Health Maintenance  Health Maintenance Due  Topic Date Due   COVID-19 Vaccine (1) Never done   Pneumonia Vaccine 68+ Years old (1 - PCV) Never done   Zoster Vaccines- Shingrix (1 of 2) Never done   COLONOSCOPY (Pts 45-87yrs Insurance coverage will need to be confirmed)  Never done   INFLUENZA VACCINE  Never done    Colorectal cancer screening: Type of screening: Colonoscopy. Completed in [place]. Repeat every 10 years  Mammogram status: Completed one year ago at [facility]. Repeat every year  Bone Density status: Completed one year ago at [facility]. Results reflect: Bone density results: NORMAL. Repeat every 5 years.  Lung Cancer Screening: (Low Dose CT Chest recommended if Age 67-80 years, 30 pack-year currently smoking OR have quit w/in 15years.) does not qualify.    Additional Screening:  Hepatitis C Screening: does qualify; Completed no  Vision Screening: Recommended annual ophthalmology exams for early detection of glaucoma and other disorders of the eye. Is the patient up to date with their annual eye exam?  Yes  Who is the provider or what is the  name of the office in which the patient attends annual eye exams? Doesn't have one If pt is not established with a provider, would they like to be referred to a provider to establish care? No .   Dental Screening: Recommended annual dental exams for proper oral hygiene  Community Resource Referral / Chronic Care Management: CRR required this visit?  No   CCM required this visit?  No      Plan:     I have personally reviewed and noted the following in the patient's chart:   Medical and social history Use of alcohol, tobacco or illicit drugs  Current medications and supplements including opioid prescriptions.  Functional ability and status Nutritional status Physical activity Advanced directives List of other physicians Hospitalizations, surgeries, and ER visits in previous 12 months Vitals Screenings to include cognitive, depression, and falls Referrals and appointments  In addition, I have reviewed and discussed with patient certain preventive protocols, quality metrics, and best practice recommendations. A written personalized care plan for preventive services as well as general preventive health recommendations were provided to patient.     Dionisio David, LPN   [id number]   Nurse Notes: none- declines all immunizations and colonoscopy testing

## 2021-03-02 NOTE — Patient Instructions (Signed)
Meredith Hamilton , Thank you for taking time to come for your Medicare Wellness Visit. I appreciate your ongoing commitment to your health goals. Please review the following plan we discussed and let me know if I can assist you in the future.   Screening recommendations/referrals: Colonoscopy: declined  Mammogram: one year ago at [place]: one year ago at [facility] Recommended yearly ophthalmology/optometry visit for glaucoma screening and checkup Recommended yearly dental visit for hygiene and checkup  Vaccinations: Influenza vaccine: declined Pneumococcal vaccine: declined Tdap vaccine: declined  Shingles vaccine: declined   Covid-19:declined  Advanced directives: declined  Conditions/risks identified:   Next appointment: Follow up in one year for your annual wellness visit    Preventive Care 29 Years and Older, Female Preventive care refers to lifestyle choices and visits with your health care provider that can promote health and wellness. What does preventive care include? A yearly physical exam. This is also called an annual well check. Dental exams once or twice a year. Routine eye exams. Ask your health care provider how often you should have your eyes checked. Personal lifestyle choices, including: Daily care of your teeth and gums. Regular physical activity. Eating a healthy diet. Avoiding tobacco and drug use. Limiting alcohol use. Practicing safe sex. Taking low-dose aspirin every day. Taking vitamin and mineral supplements as recommended by your health care provider. What happens during an annual well check? The services and screenings done by your health care provider during your annual well check will depend on your age, overall health, lifestyle risk factors, and family history of disease. Counseling  Your health care provider may ask you questions about your: Alcohol use. Tobacco use. Drug use. Emotional well-being. Home and relationship well-being. Sexual  activity. Eating habits. History of falls. Memory and ability to understand (cognition). Work and work Statistician. Reproductive health. Screening  You may have the following tests or measurements: Height, weight, and BMI. Blood pressure. Lipid and cholesterol levels. These may be checked every 5 years, or more frequently if you are over 41 years old. Skin check. Lung cancer screening. You may have this screening every year starting at age 36 if you have a 30-pack-year history of smoking and currently smoke or have quit within the past 15 years. Fecal occult blood test (FOBT) of the stool. You may have this test every year starting at age 56. Flexible sigmoidoscopy or colonoscopy. You may have a sigmoidoscopy every 5 years or a colonoscopy every 10 years starting at age 1. Hepatitis C blood test. Hepatitis B blood test. Sexually transmitted disease (STD) testing. Diabetes screening. This is done by checking your blood sugar (glucose) after you have not eaten for a while (fasting). You may have this done every 1-3 years. Bone density scan. This is done to screen for osteoporosis. You may have this done starting at age 4. Mammogram. This may be done every 1-2 years. Talk to your health care provider about how often you should have regular mammograms. Talk with your health care provider about your test results, treatment options, and if necessary, the need for more tests. Vaccines  Your health care provider may recommend certain vaccines, such as: Influenza vaccine. This is recommended every year. Tetanus, diphtheria, and acellular pertussis (Tdap, Td) vaccine. You may need a Td booster every 10 years. Zoster vaccine. You may need this after age 76. Pneumococcal 13-valent conjugate (PCV13) vaccine. One dose is recommended after age 63. Pneumococcal polysaccharide (PPSV23) vaccine. One dose is recommended after age 34. Talk to  your health care provider about which screenings and vaccines  you need and how often you need them. This information is not intended to replace advice given to you by your health care provider. Make sure you discuss any questions you have with your health care provider. Document Released: 04/24/2015 Document Revised: 12/16/2015 Document Reviewed: 01/27/2015 Elsevier Interactive Patient Education  2017 [place] Prevention in the Home Falls can cause injuries. They can happen to people of all ages. There are many things you can do to make your home safe and to help prevent falls. What can I do on the outside of my home? Regularly fix the edges of walkways and driveways and fix any cracks. Remove anything that might make you trip as you walk through a door, such as a raised step or threshold. Trim any bushes or trees on the path to your home. Use bright outdoor lighting. Clear any walking paths of anything that might make someone trip, such as rocks or tools. Regularly check to see if handrails are loose or broken. Make sure that both sides of any steps have handrails. Any raised decks and porches should have guardrails on the edges. Have any leaves, snow, or ice cleared regularly. Use sand or salt on walking paths during winter. Clean up any spills in your garage right away. This includes oil or grease spills. What can I do in the bathroom? Use night lights. Install grab bars by the toilet and in the tub and shower. Do not use towel bars as grab bars. Use non-skid mats or decals in the tub or shower. If you need to sit down in the shower, use a plastic, non-slip stool. Keep the floor dry. Clean up any water that spills on the floor as soon as it happens. Remove soap buildup in the tub or shower regularly. Attach bath mats securely with double-sided non-slip rug tape. Do not have throw rugs and other things on the floor that can make you trip. What can I do in the bedroom? Use night lights. Make sure that you have a light by your bed that  is easy to reach. Do not use any sheets or blankets that are too big for your bed. They should not hang down onto the floor. Have a firm chair that has side arms. You can use this for support while you get dressed. Do not have throw rugs and other things on the floor that can make you trip. What can I do in the kitchen? Clean up any spills right away. Avoid walking on wet floors. Keep items that you use a lot in easy-to-reach places. If you need to reach something above you, use a strong step stool that has a grab bar. Keep electrical cords out of the way. Do not use floor polish or wax that makes floors slippery. If you must use wax, use non-skid floor wax. Do not have throw rugs and other things on the floor that can make you trip. What can I do with my stairs? Do not leave any items on the stairs. Make sure that there are handrails on both sides of the stairs and use them. Fix handrails that are broken or loose. Make sure that handrails are as long as the stairways. Check any carpeting to make sure that it is firmly attached to the stairs. Fix any carpet that is loose or worn. Avoid having throw rugs at the top or bottom of the stairs. If you do have throw rugs, attach  them to the floor with carpet tape. Make sure that you have a light switch at the top of the stairs and the bottom of the stairs. If you do not have them, ask someone to add them for you. What else can I do to help prevent falls? Wear shoes that: Do not have high heels. Have rubber bottoms. Are comfortable and fit you well. Are closed at the toe. Do not wear sandals. If you use a stepladder: Make sure that it is fully opened. Do not climb a closed stepladder. Make sure that both sides of the stepladder are locked into place. Ask someone to hold it for you, if possible. Clearly mark and make sure that you can see: Any grab bars or handrails. First and last steps. Where the edge of each step is. Use tools that help you  move around (mobility aids) if they are needed. These include: Canes. Walkers. Scooters. Crutches. Turn on the lights when you go into a dark area. Replace any light bulbs as soon as they burn out. Set up your furniture so you have a clear path. Avoid moving your furniture around. If any of your floors are uneven, fix them. If there are any pets around you, be aware of where they are. Review your medicines with your doctor. Some medicines can make you feel dizzy. This can increase your chance of falling. Ask your doctor what other things that you can do to help prevent falls. This information is not intended to replace advice given to you by your health care provider. Make sure you discuss any questions you have with your health care provider. Document Released: 01/22/2009 Document Revised: 09/03/2015 Document Reviewed: 05/02/2014 Elsevier Interactive Patient Education  2017 [employer].

## 2021-03-09 ENCOUNTER — Other Ambulatory Visit: Payer: Self-pay | Admitting: Family Medicine

## 2021-04-15 ENCOUNTER — Encounter: Payer: Self-pay | Admitting: Family Medicine

## 2021-06-16 ENCOUNTER — Telehealth: Payer: Self-pay | Admitting: *Deleted

## 2021-06-16 NOTE — Telephone Encounter (Signed)
Patient needs to reschedule appointment. ?

## 2021-06-21 ENCOUNTER — Ambulatory Visit: Payer: Medicare HMO | Admitting: Nurse Practitioner

## 2021-06-21 ENCOUNTER — Ambulatory Visit: Payer: Medicare HMO | Admitting: Oncology

## 2021-07-06 ENCOUNTER — Other Ambulatory Visit: Payer: Self-pay

## 2021-07-06 ENCOUNTER — Inpatient Hospital Stay: Payer: Medicare HMO | Attending: Nurse Practitioner | Admitting: Medical Oncology

## 2021-07-06 ENCOUNTER — Encounter: Payer: Self-pay | Admitting: Medical Oncology

## 2021-07-06 VITALS — BP 121/72 | HR 66 | Temp 97.9°F | Resp 16 | Wt 205.5 lb

## 2021-07-06 DIAGNOSIS — Z801 Family history of malignant neoplasm of trachea, bronchus and lung: Secondary | ICD-10-CM | POA: Insufficient documentation

## 2021-07-06 DIAGNOSIS — Z08 Encounter for follow-up examination after completed treatment for malignant neoplasm: Secondary | ICD-10-CM

## 2021-07-06 DIAGNOSIS — C50912 Malignant neoplasm of unspecified site of left female breast: Secondary | ICD-10-CM | POA: Diagnosis not present

## 2021-07-06 DIAGNOSIS — Z90721 Acquired absence of ovaries, unilateral: Secondary | ICD-10-CM | POA: Diagnosis not present

## 2021-07-06 DIAGNOSIS — Z5181 Encounter for therapeutic drug level monitoring: Secondary | ICD-10-CM | POA: Diagnosis not present

## 2021-07-06 DIAGNOSIS — Z79811 Long term (current) use of aromatase inhibitors: Secondary | ICD-10-CM | POA: Diagnosis not present

## 2021-07-06 DIAGNOSIS — Z170 Estrogen receptor positive status [ER+]: Secondary | ICD-10-CM | POA: Diagnosis not present

## 2021-07-06 DIAGNOSIS — M85851 Other specified disorders of bone density and structure, right thigh: Secondary | ICD-10-CM | POA: Insufficient documentation

## 2021-07-06 DIAGNOSIS — Z9012 Acquired absence of left breast and nipple: Secondary | ICD-10-CM | POA: Diagnosis not present

## 2021-07-06 DIAGNOSIS — Z803 Family history of malignant neoplasm of breast: Secondary | ICD-10-CM | POA: Diagnosis not present

## 2021-07-06 DIAGNOSIS — Z79899 Other long term (current) drug therapy: Secondary | ICD-10-CM | POA: Insufficient documentation

## 2021-07-06 DIAGNOSIS — Z8049 Family history of malignant neoplasm of other genital organs: Secondary | ICD-10-CM | POA: Diagnosis not present

## 2021-07-06 DIAGNOSIS — Z853 Personal history of malignant neoplasm of breast: Secondary | ICD-10-CM | POA: Diagnosis not present

## 2021-07-06 NOTE — Progress Notes (Signed)
? ? ? ?Hematology/Oncology Consult note ?[facility]  ?[phone] [phone] ? ?Patient Care Team: ?Virginia Crews, MD as PCP - General (Family Medicine) ?Johny Blamer, [place] (Chiropractic Medicine) ?Debbora Dus, NP (Nurse Practitioner) ?Trinna Post, PA-C (Inactive) as Physician Assistant (Physician Assistant) ?Sindy Guadeloupe, MD as Consulting Physician (Oncology)  ? ?Name of the patient: Meredith Hamilton  ?[id number]  ?[birth year]  ? ?Date of visit: 07/06/21 ? ?Diagnosis- h/o left breast cancer pathological prognostic stage IB T2N2M0 ER PR positive her 2 negative s/p mastectomy and axillary LN dissection ? ?Chief complaint/ Reason for visit-routine follow-up of breast cancer ? ?Heme/Onc history: Patient is a 71 year old female who was found to have an abnormal screening mammogram at an outside facility in [place] in February 2019.  This was followed by a mastectomy and a sentinel lymph node biopsy in April 2019.  Intraoperative frozen section of the lymph node was positive and this was converted to an axillary lymph node dissection.  Final pathology revealed mixed pathology of invasive mammary as well as invasive lobular carcinoma.  There were 2 foci measuring 2.1 and 0.8 cm.  Overall grade 2.  ER PR strongly positive greater than 90% and HER-2 was negative.  5 out of 15 axillary lymph nodes were positive for metastatic disease with the largest deposit being 1.8 cm and extranodal extension was present.  Perineural invasion was identified.  No LV I.  Final margins were clear pathological stage T2N2A.  She was recommended adjuvant chemotherapy and postmastectomy radiation.  However patient did not want to go through it and eventually moved her care to [facility].  She has also been seen by surgical oncology in [facility] recently in July 2019.  She has been pursuing alternative therapy with vitamin C infusions as well as other nutritional supplements.   ?  ?Patient  was started on Arimidex in November 2019 ?  ? ?Interval history-  She reports that she has been doing well since her last visit and has been compliant with Arimidex daily. She does continue to have some overall joint pains from the medication but this has lessened slightly and she does not wish to change course with treatment at this time. No night sweats, weight changes, nipple discharge, breast pain or lumps.  ? ?ECOG PS- 1 ?Pain scale- 0 ?Opioid associated constipation- no ? ?Review of systems- Review of Systems  ?Constitutional:  Negative for chills, fever, malaise/fatigue and weight loss.  ?HENT:  Negative for congestion, ear discharge and nosebleeds.   ?Eyes:  Negative for blurred vision.  ?Respiratory:  Negative for cough, hemoptysis, sputum production, shortness of breath and wheezing.   ?Cardiovascular:  Negative for chest pain, palpitations, orthopnea and claudication.  ?Gastrointestinal:  Negative for abdominal pain, blood in stool, constipation, diarrhea, heartburn, melena, nausea and vomiting.  ?Genitourinary:  Negative for dysuria, flank pain, frequency, hematuria and urgency.  ?Musculoskeletal:  Negative for back pain, joint pain and myalgias.  ?Skin:  Negative for rash.  ?Neurological:  Negative for dizziness, tingling, focal weakness, seizures, weakness and headaches.  ?Endo/Heme/Allergies:  Does not bruise/bleed easily.  ?Psychiatric/Behavioral:  Negative for depression and suicidal ideas. The patient does not have insomnia.    ? ? ?Allergies  ?Allergen Reactions  ? Sulfa Antibiotics Swelling  ? Tramadol Other (See Comments)  ?  Other reaction(s): Hallucination  ? ? ? ?Past Medical History:  ?Diagnosis Date  ? Breast CA ([place])   ? Family history of breast cancer   ? Family  history of lung cancer   ? Family history of throat cancer   ? Hashimoto's disease   ? Hypertension   ? Non-celiac gluten sensitivity   ? ? ? ?Past Surgical History:  ?Procedure Laterality Date  ? APPENDECTOMY  1979  ? [place]  SURGERY  1974  ? MASTECTOMY W/ SENTINEL NODE BIOPSY Left 2019  ? RIGHT OOPHORECTOMY  1979  ? due to ovarian torsion  ? URETEROSCOPY Right 2017  ? ? ?Social History  ? ?Socioeconomic History  ? Marital status: Divorced  ?  Spouse name: Not on file  ? Number of children: 2  ? Years of education: 34  ? Highest education level: High school graduate  ?Occupational History  ? Occupation: retired Sales promotion account executive for [employer]  ?Tobacco Use  ? Smoking status: Never  ? Smokeless tobacco: Never  ?Vaping Use  ? Vaping Use: Never used  ?Substance and Sexual Activity  ? Alcohol use: Not Currently  ? Drug use: Never  ? Sexual activity: Not Currently  ?Other Topics Concern  ? Not on file  ?Social History Narrative  ? Not on file  ? ?Social Determinants of Health  ? ?Financial Resource Strain: Low Risk   ? Difficulty of Paying Living Expenses: Not very hard  ?Food Insecurity: No Food Insecurity  ? Worried About Charity fundraiser in the Last Year: Never true  ? Ran Out of Food in the Last Year: Never true  ?Transportation Needs: No Transportation Needs  ? Lack of Transportation (Medical): No  ? Lack of Transportation (Non-Medical): No  ?Physical Activity: Insufficiently Active  ? Days of Exercise per Week: 3 days  ? Minutes of Exercise per Session: 30 min  ?Stress: No Stress Concern Present  ? Feeling of Stress : Only a little  ?Social Connections: Moderately Isolated  ? Frequency of Communication with Friends and Family: Three times a week  ? Frequency of Social Gatherings with Friends and Family: More than three times a week  ? Attends Religious Services: More than 4 times per year  ? Active Member of Clubs or Organizations: No  ? Attends Archivist Meetings: Never  ? Marital Status: Divorced  ?Intimate Partner Violence: Not At Risk  ? Fear of Current or Ex-Partner: No  ? Emotionally Abused: No  ? Physically Abused: No  ? Sexually Abused: No  ? ? ?Family History  ?Problem Relation Age of Onset  ? Lung cancer Mother  26  ?     hx smoking  ? Lung cancer Father 61  ?     hx smoking  ? Breast cancer Sister 21  ?     in remission, now in mid 51s  ? Lung cancer Brother 23  ?     smoker  ? Throat cancer Brother   ?     alcohol  ? Colon cancer Neg Hx   ? Cervical cancer Neg Hx   ? Ovarian cancer Neg Hx   ? ? ? ?Current Outpatient Medications:  ?  amLODipine (NORVASC) 10 MG tablet, TAKE 1 TABLET(10 MG) BY MOUTH DAILY NEEDS OFFICE VISIT, Disp: 90 tablet, Rfl: 0 ?  anastrozole (ARIMIDEX) 1 MG tablet, TAKE 1 TABLET(1 MG) BY MOUTH DAILY, Disp: 90 tablet, Rfl: 3 ?  Cholecalciferol (VITAMIN D3) LIQD, Take 6 drops by mouth daily., Disp: , Rfl:  ?  OVER THE COUNTER MEDICATION, Take 4 Scoops by mouth daily. Pecta Sol-C (Mod. Citrus Pectin), Disp: , Rfl:  ?  OVER THE COUNTER  MEDICATION, Take 2 Scoops by mouth daily. Fermented Mushroom Blend, Disp: , Rfl:  ?  OVER THE COUNTER MEDICATION, Take 6 capsules by mouth daily. Cruciferous Complete, Disp: , Rfl:  ?  OVER THE COUNTER MEDICATION, Nitric Balance 4 tsp daily, Disp: , Rfl:  ?  UNABLE TO FIND, Take 10 drops by mouth daily. Med Name: Liquid Vitamin A, Disp: , Rfl:  ?  UNABLE TO FIND, Med Name: Hydrex 2 vials daily, Disp: , Rfl:  ?  UNABLE TO FIND, Med Name: Adrena Calm 1/4 pump daily, Disp: , Rfl:  ?  UNABLE TO FIND, Med Name: Immuno Plus 3 drops daily, Disp: , Rfl:  ?  UNABLE TO FIND, Med Name: Vitamin C Drip 20,000 once weekly, Disp: , Rfl:  ?  UNABLE TO FIND, Med Name: Welford Roche Vite 6 per day, Disp: , Rfl:  ?  UNABLE TO FIND, Med Name: Pea Protein Isolate 1 scoop per day, Disp: , Rfl:  ? ?Physical exam:  ?Vitals:  ? 07/06/21 1022  ?BP: 121/72  ?Pulse: 66  ?Resp: 16  ?Temp: 97.9 ?F (36.6 ?C)  ?TempSrc: Tympanic  ?SpO2: 98%  ?Weight: 205 lb 8 oz (93.2 kg)  ? ?Physical Exam ?Constitutional:   ?   General: She is not in acute distress. ?Cardiovascular:  ?   Rate and Rhythm: Normal rate and regular rhythm.  ?   Heart sounds: Normal heart sounds.  ?Pulmonary:  ?   Effort: Pulmonary effort is normal.  ?    Breath sounds: Normal breath sounds.  ?Abdominal:  ?   General: Bowel sounds are normal.  ?   Palpations: Abdomen is soft.  ?Musculoskeletal:     ?   General: No swelling, tenderness or deformity.

## 2021-07-06 NOTE — Progress Notes (Signed)
Pt in for follow up, denies any concerns today. 

## 2021-07-08 LAB — HM MAMMOGRAPHY

## 2021-07-29 ENCOUNTER — Other Ambulatory Visit: Payer: Self-pay | Admitting: Family Medicine

## 2021-07-30 NOTE — Telephone Encounter (Signed)
Requested Prescriptions  ?Pending Prescriptions Disp Refills  ?? amLODipine (NORVASC) 10 MG tablet [Pharmacy Med Name: AMLODIPINE BESYLATE '10MG'$  TABLETS] 90 tablet 0  ?  Sig: TAKE 1 TABLET(10 MG) BY MOUTH DAILY  ?  ? Cardiovascular: Calcium Channel Blockers 2 Passed - 07/29/2021 11:34 AM  ?  ?  Passed - Last BP in normal range  ?  BP Readings from Last 1 Encounters:  ?07/06/21 121/72  ?   ?  ?  Passed - Last Heart Rate in normal range  ?  Pulse Readings from Last 1 Encounters:  ?07/06/21 66  ?   ?  ?  Passed - Valid encounter within last 6 months  ?  Recent Outpatient Visits   ?      ? 1 year ago Essential (primary) hypertension  ? [facility], Dionne Bucy, MD  ? 1 year ago Essential (primary) hypertension  ? [facility] [place], Dionne Bucy, MD  ? 2 years ago Encounter for annual physical exam  ? [facility], Dionne Bucy, MD  ? 3 years ago Essential (primary) hypertension  ? [facility], Dionne Bucy, MD  ? 3 years ago Encounter for annual physical exam  ? [facility], Dionne Bucy, MD  ?  ?  ? ?  ?  ?  ? ?

## 2021-09-02 ENCOUNTER — Ambulatory Visit: Payer: Self-pay

## 2021-09-02 NOTE — Telephone Encounter (Signed)
Noted  

## 2021-09-02 NOTE — Telephone Encounter (Signed)
  Chief Complaint: left ankle swelling  Symptoms: redness and warmth at the left ankle, itchy Frequency: for  1 months but the swelling comes and goes Pertinent Negatives: Patient denies fever or pain Disposition: '[]'$ ED /'[]'$ Urgent Care (no appt availability in office) / '[x]'$ Appointment(In office/virtual)/ '[]'$  [facility]/ '[]'$ Home Care/ '[]'$ Refused Recommended Disposition /'[]'$ [facility]/ '[]'$  Follow-up with PCP Additional Notes: Discussed with pt concern for the redness and warmth. Advised pt to go to UC today, but pt refused stating she has no transportation. Advised pt to outline the redness and elevate and try ice pack. Advised pt to call back or go to UC if worsens.    Reason for Disposition  [1] Redness AND [2] painful when touched AND [3] no fever  Answer Assessment - Initial Assessment Questions 1. LOCATION: "Which ankle is swollen?" "Where is the swelling?"     Left swollen and redness warm to touch, also itching at site- 2. ONSET: "When did the swelling start?"     1 month  doesn't occur everyday- 3. SIZE: "How large is the swelling?"     Cannot see ankle bones 4. PAIN: "Is there any pain?" If Yes, ask: "How bad is it?" (Scale 1-10; or mild, moderate, severe)   - NONE (0): no pain.   - MILD (1-3): doesn't interfere with normal activities.    - MODERATE (4-7): interferes with normal activities (e.g., work or school) or awakens from sleep, limping.    - SEVERE (8-10): excruciating pain, unable to do any normal activities, unable to walk.      no 5. CAUSE: "What do you think caused the ankle swelling?"     unsure 6. OTHER SYMPTOMS: "Do you have any other symptoms?" (e.g., fever, chest pain, difficulty breathing, calf pain)     Spreading redness and it hot  Protocols used: Ankle Swelling-A-AH

## 2021-09-02 NOTE — Progress Notes (Signed)
Established patient visit  I,Joseline E Rosas,acting as a scribe for Lavon Paganini, MD.,have documented all relevant documentation on the behalf of Lavon Paganini, MD,as directed by  Lavon Paganini, MD while in the presence of Lavon Paganini, MD.   Patient: Meredith Hamilton   DOB: 09-Aug-1950   71 y.o. Female  MRN: [id number] Visit Date: 09/03/2021  Today's healthcare provider: Lavon Paganini, MD   Chief Complaint  Patient presents with   Leg Pain   Subjective    Leg Pain  There was no injury mechanism. The pain is present in the right leg and right hip. The quality of the pain is described as stabbing and aching ("gripping"). The pain is at a severity of 10/10. The pain is severe. The pain has been Fluctuating since onset. Associated symptoms include an inability to bear weight. Pertinent negatives include no loss of motion, loss of sensation, numbness or tingling. Associated symptoms comments: "Goes out" with standing. She reports no foreign bodies present. Nothing aggravates the symptoms. She has tried ice, heat, elevation and rest (Massage, CBD cream) for the symptoms.     R hip pain is same as 2021 and 2022 but intensified.  Functional medicine is working with her and she is taking supplements to help.     Rash on b/l hands, then seemed to occur on b/l ankles and shoulders.  Seems to be mirrored.  Dry and cracking.  Functional medicine told her she needs a second set of eyes on it. +Itch. Shoulders are better.  Tried calamine and cortisone cream. No oozing or drainage.  Medications: Outpatient Medications Prior to Visit  Medication Sig   amLODipine (NORVASC) 10 MG tablet TAKE 1 TABLET(10 MG) BY MOUTH DAILY   anastrozole (ARIMIDEX) 1 MG tablet TAKE 1 TABLET(1 MG) BY MOUTH DAILY   BLACK COHOSH PO Take by mouth. 2 Tsp daily   Calcium-Magnesium (CAL-MAG PO) Take by mouth.   Cholecalciferol (VITAMIN D3) LIQD Take 6 drops by mouth daily.   OVER THE COUNTER MEDICATION  Take 4 Scoops by mouth daily. Pecta Sol-C (Mod. Citrus Pectin)   OVER THE COUNTER MEDICATION Take 2 Scoops by mouth daily. Fermented Mushroom Blend   OVER THE COUNTER MEDICATION Take 6 capsules by mouth daily. Cruciferous Complete   OVER THE COUNTER MEDICATION Nitric Balance 4 tsp daily   UNABLE TO FIND Take 10 drops by mouth daily. Med Name: Liquid Vitamin A   UNABLE TO FIND Med Name: Hydrex 2 vials daily   UNABLE TO FIND Med Name: Adrena Calm 1/4 pump daily   UNABLE TO FIND Med Name: BIOST skeletal and cellular Health 6 capsules daily   [place] Name: A-C Carbamide 3 daily   UNABLE TO FIND Med Name: [place] 1 daily   [place] Med Name: Immuno Plus 3 drops daily (Patient not taking: Reported on 09/03/2021)   UNABLE TO FIND Med Name: Vitamin C Drip 20,000 once weekly (Patient not taking: Reported on 09/03/2021)   UNABLE TO FIND Med Name: [place] Roche Vite 6 per day (Patient not taking: Reported on 09/03/2021)   UNABLE TO FIND Med Name: Pea Protein Isolate 1 scoop per day   No facility-administered medications prior to visit.    Review of Systems  Neurological:  Negative for tingling and numbness.      Objective    BP 131/75 (BP Location: Right Arm, Patient Position: Sitting, Cuff Size: Large)   Pulse (!) 54   Temp 97.7 F (36.5 C) (  Oral)   Resp 16   Ht '5\' 6"'$  (1.676 m)   Wt 210 lb 11.2 oz (95.6 kg)   BMI 34.01 kg/m    Physical Exam Vitals reviewed.  Constitutional:      General: She is not in acute distress.    Appearance: Normal appearance. She is well-developed. She is not diaphoretic.  HENT:     Head: Normocephalic and atraumatic.  Eyes:     General: No scleral icterus.    Conjunctiva/sclera: Conjunctivae normal.  Neck:     Thyroid: No thyromegaly.  Cardiovascular:     Rate and Rhythm: Normal rate and regular rhythm.     Heart sounds: Murmur heard.  Pulmonary:     Effort: Pulmonary effort is normal. No respiratory distress.     Breath sounds: Normal breath  sounds. No wheezing, rhonchi or rales.  Musculoskeletal:     Right lower leg: Edema present.     Left lower leg: Edema present.     Comments: R hip:  TTP over greater trochanteric bursa.  Normal strength and sensation.  Limited internal and external rotation. Limited internal rotation of L hip as well.  Skin:    General: Skin is warm and dry.     Findings: Rash (erythematous, dry rash of hands and ankles) present.  Neurological:     Mental Status: She is alert and oriented to person, place, and time. Mental status is at baseline.  Psychiatric:        Mood and Affect: Mood normal.        Behavior: Behavior normal.      Results for orders placed or performed in visit on 09/03/21  HM MAMMOGRAPHY  Result Value Ref Range   HM Mammogram 0-4 Bi-Rad 0-4 Bi-Rad, Self Reported Normal    Assessment & Plan     1. Trochanteric bursitis of right hip -New problem - Most of her pain seems to be coming from her bursitis which is notably tender on exam - Meloxicam daily for 1 month-reviewed last creatinine which is within normal limits - Home exercise program given - If not improving, consider corticosteroid injection and possible Ortho referral at next visit  2. Bilateral primary osteoarthritis of hip -Longstanding and known problem - Reviewed x-rays from 2021 and bone scan from 2022 which demonstrated no metastatic lesions but degenerative changes - She does have limited internal rotation of both hips and limited external rotation of the right hip - External rotation of the right hip may be limited by pain related to her bursitis, but she may benefit from Ortho referral and formal PT in the future with her limited internal rotation likely due to OA  3. Eczema, dyshidrotic -New problem - Rash of bilateral hands is consistent with dyshidrotic eczema - Discussed keeping the hands dry and avoiding excess exposure to cleaning products and soap - Triamcinolone ointment twice daily - May wrap the  area at night to sleep for occlusion dressing  4. Stasis dermatitis of both legs -New problem - Discussed the importance of decreasing her swelling with elevation or compression as tolerated - For the dermatitis, we will treat with triamcinolone ointment twice daily - Can consider occlusion dressing for sleep - There is significant excoriation, so we discussed limiting scratching as possible as well - No signs of secondary infection currently   Meds ordered this encounter  Medications   meloxicam (MOBIC) 15 MG tablet    Sig: Take 1 tablet (15 mg total) by mouth daily.  Dispense:  30 tablet    Refill:  0   triamcinolone ointment (KENALOG) 0.5 %    Sig: Apply 1 application. topically 2 (two) times daily.    Dispense:  60 g    Refill:  1    Return in about 4 weeks (around 10/01/2021) for chronic disease f/u.      I, Lavon Paganini, MD, have reviewed all documentation for this visit. The documentation on 09/03/21 for the exam, diagnosis, procedures, and orders are all accurate and complete.   Rashaun Curl, Dionne Bucy, MD, MPH [facility]

## 2021-09-03 ENCOUNTER — Ambulatory Visit (INDEPENDENT_AMBULATORY_CARE_PROVIDER_SITE_OTHER): Payer: Medicare HMO | Admitting: Family Medicine

## 2021-09-03 ENCOUNTER — Encounter: Payer: Self-pay | Admitting: Family Medicine

## 2021-09-03 VITALS — BP 131/75 | HR 54 | Temp 97.7°F | Resp 16 | Ht 66.0 in | Wt 210.7 lb

## 2021-09-03 DIAGNOSIS — M160 Bilateral primary osteoarthritis of hip: Secondary | ICD-10-CM | POA: Diagnosis not present

## 2021-09-03 DIAGNOSIS — M7061 Trochanteric bursitis, right hip: Secondary | ICD-10-CM

## 2021-09-03 DIAGNOSIS — I872 Venous insufficiency (chronic) (peripheral): Secondary | ICD-10-CM | POA: Diagnosis not present

## 2021-09-03 DIAGNOSIS — L301 Dyshidrosis [pompholyx]: Secondary | ICD-10-CM | POA: Diagnosis not present

## 2021-09-03 MED ORDER — TRIAMCINOLONE ACETONIDE 0.5 % EX OINT: 1.0000 "application " | TOPICAL_OINTMENT | Freq: Two times a day (BID) | CUTANEOUS | 1 refills | Status: DC

## 2021-09-03 MED ORDER — MELOXICAM 15 MG PO TABS: 15.0000 mg | ORAL_TABLET | Freq: Every day | ORAL | 0 refills | Status: DC

## 2021-10-04 ENCOUNTER — Encounter: Payer: Self-pay | Admitting: Family Medicine

## 2021-10-04 ENCOUNTER — Encounter: Payer: Medicare HMO | Admitting: Family Medicine

## 2021-10-04 DIAGNOSIS — I10 Essential (primary) hypertension: Secondary | ICD-10-CM

## 2021-10-06 NOTE — Progress Notes (Unsigned)
I,Meredith Hamilton,acting as a Education administrator for [employer], PA-C.,have documented all relevant documentation on the behalf of Mikey Kirschner, PA-C,as directed by  Mikey Kirschner, PA-C while in the presence of Mikey Kirschner, PA-C.  Established patient visit   Patient: Meredith Hamilton   DOB: 03-Jul-1950   71 y.o. Female  MRN: [id number] Visit Date: 10/07/2021  Today's healthcare provider: Mikey Kirschner, PA-C   No chief complaint on file.  Subjective    HPI  Hypertension, follow-up  BP Readings from Last 3 Encounters:  10/04/21 116/60  09/03/21 131/75  07/06/21 121/72   Wt Readings from Last 3 Encounters:  10/04/21 206 lb 6.4 oz (93.6 kg)  09/03/21 210 lb 11.2 oz (95.6 kg)  07/06/21 205 lb 8 oz (93.2 kg)     She was last seen for hypertension {NUMBERS 1-12:18279} {days/wks/mos/yrs:310907} ago.  BP at that visit was ***. Management since that visit includes continue current treatment.  She reports {excellent/good/fair/poor:19665} compliance with treatment. She {is/is not:9024} having side effects. {document side effects if present:1} She is following a {diet:21022986} diet. She {is/is not:9024} exercising. She {does/does not:200015} smoke.  Use of agents associated with hypertension: {bp agents assoc with hypertension:511::"none"}.   Outside blood pressures are {***enter patient reported home BP readings, or 'not being checked':1}. Symptoms: {Yes/No:20286} chest pain {Yes/No:20286} chest pressure  {Yes/No:20286} palpitations {Yes/No:20286} syncope  {Yes/No:20286} dyspnea {Yes/No:20286} orthopnea  {Yes/No:20286} paroxysmal nocturnal dyspnea {Yes/No:20286} lower extremity edema   Pertinent labs Lab Results  Component Value Date   CHOL 152 02/27/2020   HDL 57 02/27/2020   LDLCALC 75 02/27/2020   TRIG 111 02/27/2020   CHOLHDL 2.7 02/18/2019   Lab Results  Component Value Date   NA 142 06/04/2020   K 3.6 06/04/2020   CREATININE 0.88 06/04/2020   GFRNONAA >60  06/04/2020   GLUCOSE 89 06/04/2020   TSH 2.940 02/27/2020     The 10-year ASCVD risk score (Arnett DK, et al., 2019) is: 13.7%  ---------------------------------------------------------------------------------------------------   Medications: Outpatient Medications Prior to Visit  Medication Sig   amLODipine (NORVASC) 10 MG tablet TAKE 1 TABLET(10 MG) BY MOUTH DAILY   anastrozole (ARIMIDEX) 1 MG tablet TAKE 1 TABLET(1 MG) BY MOUTH DAILY   BLACK COHOSH PO Take by mouth. 2 Tsp daily   Calcium-Magnesium (CAL-MAG PO) Take by mouth.   Cholecalciferol (VITAMIN D3) LIQD Take 6 drops by mouth daily.   meloxicam (MOBIC) 15 MG tablet Take 1 tablet (15 mg total) by mouth daily.   OVER THE COUNTER MEDICATION Take 4 Scoops by mouth daily. Pecta Sol-C (Mod. Citrus Pectin)   OVER THE COUNTER MEDICATION Take 2 Scoops by mouth daily. Fermented Mushroom Blend   OVER THE COUNTER MEDICATION Take 6 capsules by mouth daily. Cruciferous Complete   OVER THE COUNTER MEDICATION Nitric Balance 4 tsp daily   triamcinolone ointment (KENALOG) 0.5 % Apply 1 application. topically 2 (two) times daily.   UNABLE TO FIND Take 10 drops by mouth daily. Med Name: Liquid Vitamin A   UNABLE TO FIND Med Name: Hydrex 2 vials daily   UNABLE TO FIND Med Name: Adrena Calm 1/4 pump daily   UNABLE TO FIND Med Name: BIOST skeletal and cellular Health 6 capsules daily   UNABLE TO FIND Med Name: A-C Carbamide 3 daily   UNABLE TO FIND Med Name: Derma Co 1 daily   No facility-administered medications prior to visit.    Review of Systems  {Labs  Heme  Chem  Endocrine  Serology  Results  Review (optional):23779}   Objective    There were no vitals taken for this visit. {Show previous vital signs (optional):23777}  Physical Exam  ***  No results found for any visits on 10/07/21.  Assessment & Plan     ***  No follow-ups on file.      {provider attestation***:1}   Mikey Kirschner, PA-C  [facility] [phone] (phone) [phone] (fax)  [place]

## 2021-10-07 ENCOUNTER — Ambulatory Visit: Payer: Medicare HMO | Admitting: Family Medicine

## 2021-10-07 ENCOUNTER — Encounter: Payer: Self-pay | Admitting: Physician Assistant

## 2021-10-07 ENCOUNTER — Ambulatory Visit (INDEPENDENT_AMBULATORY_CARE_PROVIDER_SITE_OTHER): Payer: Medicare HMO | Admitting: Physician Assistant

## 2021-10-07 VITALS — BP 120/71 | HR 56 | Temp 97.7°F | Ht 66.0 in | Wt 206.9 lb

## 2021-10-07 DIAGNOSIS — E538 Deficiency of other specified B group vitamins: Secondary | ICD-10-CM | POA: Diagnosis not present

## 2021-10-07 DIAGNOSIS — R609 Edema, unspecified: Secondary | ICD-10-CM

## 2021-10-07 DIAGNOSIS — I10 Essential (primary) hypertension: Secondary | ICD-10-CM

## 2021-10-07 DIAGNOSIS — Z8639 Personal history of other endocrine, nutritional and metabolic disease: Secondary | ICD-10-CM | POA: Diagnosis not present

## 2021-10-07 DIAGNOSIS — E041 Nontoxic single thyroid nodule: Secondary | ICD-10-CM

## 2021-10-07 DIAGNOSIS — Z1211 Encounter for screening for malignant neoplasm of colon: Secondary | ICD-10-CM

## 2021-10-07 DIAGNOSIS — E669 Obesity, unspecified: Secondary | ICD-10-CM

## 2021-10-07 MED ORDER — AMLODIPINE BESYLATE 10 MG PO TABS: ORAL_TABLET | ORAL | 3 refills | Status: DC

## 2021-10-07 NOTE — Assessment & Plan Note (Signed)
Historically. Will recheck

## 2021-10-07 NOTE — Assessment & Plan Note (Signed)
Per pt stable. Likely venous insufficiency.  Advised compression socks, movement. offered referral to vascular, pt declined. Advised if erythema increases or if there is pain to return to the office.

## 2021-10-07 NOTE — Assessment & Plan Note (Signed)
Will recheck tsh/t4 and full thyroid panel per pt preference H/o thyroid US in 2021, nodules that did not require following.

## 2021-10-07 NOTE — Assessment & Plan Note (Signed)
Well controlled on amlodipine 10 mg  Ordered cmp F/u 6 mo

## 2021-10-08 ENCOUNTER — Telehealth: Payer: Self-pay

## 2021-10-08 LAB — IRON,TIBC AND FERRITIN PANEL
Ferritin: 184 ng/mL — ABNORMAL HIGH (ref 15–150)
Iron Saturation: 26 % (ref 15–55)
Iron: 90 ug/dL (ref 27–139)
Total Iron Binding Capacity: 349 ug/dL (ref 250–450)
UIBC: 259 ug/dL (ref 118–369)

## 2021-10-08 LAB — HEMOGLOBIN A1C
Est. average glucose Bld gHb Est-mCnc: 108 mg/dL
Hgb A1c MFr Bld: 5.4 % (ref 4.8–5.6)

## 2021-10-08 LAB — LIPID PANEL
Chol/HDL Ratio: 2.4 ratio (ref 0.0–4.4)
Cholesterol, Total: 155 mg/dL (ref 100–199)
HDL: 64 mg/dL (ref >39)
LDL Chol Calc (NIH): 76 mg/dL (ref 0–99)
Triglycerides: 79 mg/dL (ref 0–149)
VLDL Cholesterol Cal: 15 mg/dL (ref 5–40)

## 2021-10-08 LAB — CBC WITH DIFFERENTIAL/PLATELET
Basophils Absolute: 0 10*3/uL (ref 0.0–0.2)
Basos: 1 %
EOS (ABSOLUTE): 0.2 10*3/uL (ref 0.0–0.4)
Eos: 4 %
Hematocrit: 39.2 % (ref 34.0–46.6)
Hemoglobin: 13.1 g/dL (ref 11.1–15.9)
Immature Grans (Abs): 0 10*3/uL (ref 0.0–0.1)
Immature Granulocytes: 0 %
Lymphocytes Absolute: 2.6 10*3/uL (ref 0.7–3.1)
Lymphs: 40 %
MCH: 31.3 pg (ref 26.6–33.0)
MCHC: 33.4 g/dL (ref 31.5–35.7)
MCV: 94 fL (ref 79–97)
Monocytes Absolute: 0.5 10*3/uL (ref 0.1–0.9)
Monocytes: 7 %
Neutrophils Absolute: 3 10*3/uL (ref 1.4–7.0)
Neutrophils: 48 %
Platelets: 191 10*3/uL (ref 150–450)
RBC: 4.19 x10E6/uL (ref 3.77–5.28)
RDW: 12.6 % (ref 11.7–15.4)
WBC: 6.3 10*3/uL (ref 3.4–10.8)

## 2021-10-08 LAB — COMPREHENSIVE METABOLIC PANEL
ALT: 14 IU/L (ref 0–32)
AST: 23 IU/L (ref 0–40)
Albumin/Globulin Ratio: 2 (ref 1.2–2.2)
Albumin: 4.9 g/dL — ABNORMAL HIGH (ref 3.7–4.7)
Alkaline Phosphatase: 91 IU/L (ref 44–121)
BUN/Creatinine Ratio: 16 (ref 12–28)
BUN: 12 mg/dL (ref 8–27)
Bilirubin Total: 0.5 mg/dL (ref 0.0–1.2)
CO2: 20 mmol/L (ref 20–29)
Calcium: 9.6 mg/dL (ref 8.7–10.3)
Chloride: 104 mmol/L (ref 96–106)
Creatinine, Ser: 0.77 mg/dL (ref 0.57–1.00)
Globulin, Total: 2.5 g/dL (ref 1.5–4.5)
Glucose: 102 mg/dL — ABNORMAL HIGH (ref 70–99)
Potassium: 3.9 mmol/L (ref 3.5–5.2)
Sodium: 140 mmol/L (ref 134–144)
Total Protein: 7.4 g/dL (ref 6.0–8.5)
eGFR: 81 mL/min/{1.73_m2} (ref >59)

## 2021-10-08 LAB — T3 UPTAKE: T3 Uptake Ratio: 27 % (ref 24–39)

## 2021-10-08 LAB — TSH+FREE T4
Free T4: 1.26 ng/dL (ref 0.82–1.77)
TSH: 2.85 u[IU]/mL (ref 0.450–4.500)

## 2021-10-08 LAB — THYROID PEROXIDASE ANTIBODY: Thyroperoxidase Ab SerPl-aCnc: <9 IU/mL (ref 0–34)

## 2021-10-08 LAB — T3, FREE: T3, Free: 3.2 pg/mL (ref 2.0–4.4)

## 2021-10-08 LAB — VITAMIN B12: Vitamin B-12: 136 pg/mL — ABNORMAL LOW (ref 232–1245)

## 2021-10-08 NOTE — Telephone Encounter (Signed)
PT returned our call. Shared provider's note. Pt will begin B-12 supplementation and will ease up on iron.  Mikey Kirschner, PA-C  10/08/2021 11:00 AM EDT   Vitamin B12 is low. We can either take an oral supplement, 1,000 mcg daily, or start weekly injections for a month. Whichever her preference is.  Iron levels look fine, ferritin is slightly high but this is nonspecific, nothing to do. If she takes an iron supplement I might take it every other day, or twice a week instead of daily.  All thyroid labs within range.

## 2021-10-20 ENCOUNTER — Telehealth: Payer: Self-pay

## 2021-10-20 NOTE — Telephone Encounter (Signed)
Copied from [place] [id number]. Topic: General - Inquiry >> Oct 20, 2021  2:18 PM Chapman Fitch wrote: Reason for CRM: Pt received a cologuard kit and she has no plans of using it / she wants to know what to do with it . Does she contact the company to advise that she wont use it and send it back / please advise

## 2021-10-25 ENCOUNTER — Other Ambulatory Visit: Payer: Self-pay | Admitting: Oncology

## 2022-01-04 ENCOUNTER — Inpatient Hospital Stay: Payer: Medicare HMO | Attending: Oncology | Admitting: Oncology

## 2022-01-04 ENCOUNTER — Encounter: Payer: Self-pay | Admitting: Oncology

## 2022-01-04 VITALS — BP 141/60 | HR 53 | Temp 97.2°F | Resp 18 | Wt 209.8 lb

## 2022-01-04 DIAGNOSIS — M85831 Other specified disorders of bone density and structure, right forearm: Secondary | ICD-10-CM | POA: Diagnosis not present

## 2022-01-04 DIAGNOSIS — Z5181 Encounter for therapeutic drug level monitoring: Secondary | ICD-10-CM

## 2022-01-04 DIAGNOSIS — Z79811 Long term (current) use of aromatase inhibitors: Secondary | ICD-10-CM | POA: Insufficient documentation

## 2022-01-04 DIAGNOSIS — C50912 Malignant neoplasm of unspecified site of left female breast: Secondary | ICD-10-CM | POA: Insufficient documentation

## 2022-01-04 DIAGNOSIS — Z853 Personal history of malignant neoplasm of breast: Secondary | ICD-10-CM | POA: Diagnosis not present

## 2022-01-04 DIAGNOSIS — Z9012 Acquired absence of left breast and nipple: Secondary | ICD-10-CM | POA: Diagnosis not present

## 2022-01-04 DIAGNOSIS — Z170 Estrogen receptor positive status [ER+]: Secondary | ICD-10-CM | POA: Insufficient documentation

## 2022-01-04 DIAGNOSIS — Z08 Encounter for follow-up examination after completed treatment for malignant neoplasm: Secondary | ICD-10-CM

## 2022-01-04 NOTE — Progress Notes (Signed)
Hematology/Oncology Consult note [facility]  [phone] [phone]  Patient Care Team: Virginia Crews, MD as PCP - General (Family Medicine) Johny Blamer, [place] (Chiropractic Medicine) Debbora Dus, NP (Nurse Practitioner) Paulene Floor (Inactive) as Physician Assistant (Physician Assistant) Sindy Guadeloupe, MD as Consulting Physician (Oncology)   Name of the patient: Meredith Hamilton  [id number]  08-19-1950   Date of visit: 01/04/22  Diagnosis- h/o left breast cancer pathological prognostic stage IB T2N2M0 ER PR positive her 2 negative s/p mastectomy and axillary LN dissection  Chief complaint/ Reason for visit-routine follow-up of breast cancer on Arimidex  Heme/Onc history: Patient is a 71 year old female who was found to have an abnormal screening mammogram at an outside facility in [place] in February 2019.  This was followed by a mastectomy and a sentinel lymph node biopsy in April 2019.  Intraoperative frozen section of the lymph node was positive and this was converted to an axillary lymph node dissection.  Final pathology revealed mixed pathology of invasive mammary as well as invasive lobular carcinoma.  There were 2 foci measuring 2.1 and 0.8 cm.  Overall grade 2.  ER PR strongly positive greater than 90% and HER-2 was negative.  5 out of 15 axillary lymph nodes were positive for metastatic disease with the largest deposit being 1.8 cm and extranodal extension was present.  Perineural invasion was identified.  No LV I.  Final margins were clear pathological stage T2N2A.  She was recommended adjuvant chemotherapy and postmastectomy radiation.  However patient did not want to go through it and eventually moved her care to [facility].  She has also been seen by surgical oncology in [facility] recently in July 2019.  She has been pursuing alternative therapy with vitamin C infusions as well as other nutritional supplements.      Patient was started on Arimidex in November 2019  Interval history-patient is doing well and reports no complaints at this time.  Appetite and weight have remained stable.  Denies any breast concerns.  Tolerating Arimidex along with calcium and vitamin D without any significant side effects.  ECOG PS- 1 Pain scale- 0   Review of systems- Review of Systems  Constitutional:  Negative for chills, fever, malaise/fatigue and weight loss.  HENT:  Negative for congestion, ear discharge and nosebleeds.   Eyes:  Negative for blurred vision.  Respiratory:  Negative for cough, hemoptysis, sputum production, shortness of breath and wheezing.   Cardiovascular:  Negative for chest pain, palpitations, orthopnea and claudication.  Gastrointestinal:  Negative for abdominal pain, blood in stool, constipation, diarrhea, heartburn, melena, nausea and vomiting.  Genitourinary:  Negative for dysuria, flank pain, frequency, hematuria and urgency.  Musculoskeletal:  Negative for back pain, joint pain and myalgias.  Skin:  Negative for rash.  Neurological:  Negative for dizziness, tingling, focal weakness, seizures, weakness and headaches.  Endo/Heme/Allergies:  Does not bruise/bleed easily.  Psychiatric/Behavioral:  Negative for depression and suicidal ideas. The patient does not have insomnia.       Allergies  Allergen Reactions   Other Rash   Sulfa Antibiotics Swelling   Tramadol Other (See Comments)    Other reaction(s): Hallucination     Past Medical History:  Diagnosis Date   Breast CA ([place])    Family history of breast cancer    Family history of lung cancer    Family history of throat cancer    Hashimoto's disease    Hypertension  Non-celiac gluten sensitivity      Past Surgical History:  Procedure Laterality Date   Metaline W/ SENTINEL NODE BIOPSY Left 2019   RIGHT OOPHORECTOMY  1979   due to ovarian torsion   URETEROSCOPY  Right 2017    Social History   Socioeconomic History   Marital status: Divorced    Spouse name: Not on file   Number of children: 2   Years of education: 12   Highest education level: High school graduate  Occupational History   Occupation: retired Sales promotion account executive for [employer]  Tobacco Use   Smoking status: Never   Smokeless tobacco: Never  Vaping Use   Vaping Use: Never used  Substance and Sexual Activity   Alcohol use: Not Currently   Drug use: Never   Sexual activity: Not Currently  Other Topics Concern   Not on file  Social History Narrative   Not on file   Social Determinants of Health   Financial Resource Strain: Low Risk  (03/02/2021)   Overall Financial Resource Strain (CARDIA)    Difficulty of Paying Living Expenses: Not very hard  Food Insecurity: No Food Insecurity (03/02/2021)   Hunger Vital Sign    Worried About Running Out of Food in the Last Year: Never true    Ran Out of Food in the Last Year: Never true  Transportation Needs: No Transportation Needs (03/02/2021)   PRAPARE - Hydrologist (Medical): No    Lack of Transportation (Non-Medical): No  Physical Activity: Insufficiently Active (03/02/2021)   Exercise Vital Sign    Days of Exercise per Week: 3 days    Minutes of Exercise per Session: 30 min  Stress: No Stress Concern Present (03/02/2021)   [place]    Feeling of Stress : Only a little  Social Connections: Moderately Isolated (03/02/2021)   Social Connection and Isolation Panel [NHANES]    Frequency of Communication with Friends and Family: Three times a week    Frequency of Social Gatherings with Friends and Family: More than three times a week    Attends Religious Services: More than 4 times per year    Active Member of [employer] or Organizations: No    Attends Archivist Meetings: Never    Marital Status: Divorced  Human resources officer  Violence: Not At Risk (03/02/2021)   Humiliation, Afraid, Rape, and Kick questionnaire    Fear of Current or Ex-Partner: No    Emotionally Abused: No    Physically Abused: No    Sexually Abused: No    Family History  Problem Relation Age of Onset   Lung cancer Mother 22       hx smoking   Lung cancer Father 63       hx smoking   Breast cancer Sister 36       in remission, now in mid 54s   Lung cancer Brother 7       smoker   Throat cancer Brother        alcohol   Colon cancer Neg Hx    Cervical cancer Neg Hx    Ovarian cancer Neg Hx      Current Outpatient Medications:    amLODipine (NORVASC) 10 MG tablet, TAKE 1 TABLET(10 MG) BY MOUTH DAILY, Disp: 90 tablet, Rfl: 3   anastrozole (ARIMIDEX) 1 MG tablet, TAKE 1 TABLET(1 MG) BY MOUTH DAILY,  Disp: 90 tablet, Rfl: 3   BLACK COHOSH PO, Take by mouth. 2 Tsp daily, Disp: , Rfl:    Calcium-Magnesium (CAL-MAG PO), Take by mouth., Disp: , Rfl:    Cholecalciferol (VITAMIN D3) LIQD, Take 6 drops by mouth daily., Disp: , Rfl:    meloxicam (MOBIC) 15 MG tablet, Take 1 tablet (15 mg total) by mouth daily., Disp: 30 tablet, Rfl: 0   OVER THE COUNTER MEDICATION, Take 4 Scoops by mouth daily. Pecta Sol-C (Mod. Citrus Pectin), Disp: , Rfl:    OVER THE COUNTER MEDICATION, Take 2 Scoops by mouth daily. Fermented Mushroom Blend, Disp: , Rfl:    OVER THE COUNTER MEDICATION, Take 6 capsules by mouth daily. Cruciferous Complete, Disp: , Rfl:    OVER THE COUNTER MEDICATION, Nitric Balance 4 tsp daily, Disp: , Rfl:    triamcinolone ointment (KENALOG) 0.5 %, Apply 1 application. topically 2 (two) times daily., Disp: 60 g, Rfl: 1   UNABLE TO FIND, Take 10 drops by mouth daily. Med Name: Liquid Vitamin A, Disp: , Rfl:    UNABLE TO FIND, Med Name: Hydrex 2 vials daily, Disp: , Rfl:    UNABLE TO FIND, Med Name: Adrena Calm 1/4 pump daily, Disp: , Rfl:    UNABLE TO FIND, Med Name: BIOST skeletal and cellular Health 6 capsules daily, Disp: , Rfl:    UNABLE  TO FIND, Med Name: A-C Carbamide 3 daily, Disp: , Rfl:    UNABLE TO FIND, Med Name: Derma Co 1 daily, Disp: , Rfl:   Physical exam:  Vitals:   01/04/22 0929  BP: (!) 141/60  Pulse: (!) 53  Resp: 18  Temp: (!) 97.2 F (36.2 C)  SpO2: 100%  Weight: 209 lb 12.8 oz (95.2 kg)   Physical Exam Constitutional:      General: She is not in acute distress. Cardiovascular:     Rate and Rhythm: Normal rate and regular rhythm.     Heart sounds: Normal heart sounds.  Pulmonary:     Effort: Pulmonary effort is normal.     Breath sounds: Normal breath sounds.  Abdominal:     General: Bowel sounds are normal.     Palpations: Abdomen is soft.  Skin:    General: Skin is warm and dry.  Neurological:     Mental Status: She is alert and oriented to person, place, and time.   Breast exam: Patient is s/p left mastectomy without reconstruction.  No evidence of chest wall recurrence.  No palpable left axillary adenopathy.  No palpable masses in the right breast or right axillary adenopathy.     Latest Ref Rng & Units 10/07/2021   10:09 AM  CMP  Glucose 70 - 99 mg/dL 102   BUN 8 - 27 mg/dL 12   Creatinine 0.57 - 1.00 mg/dL 0.77   Sodium 134 - 144 mmol/L 140   Potassium 3.5 - 5.2 mmol/L 3.9   Chloride 96 - 106 mmol/L 104   CO2 20 - 29 mmol/L 20   Calcium 8.7 - 10.3 mg/dL 9.6   Total Protein 6.0 - 8.5 g/dL 7.4   Total Bilirubin 0.0 - 1.2 mg/dL 0.5   Alkaline Phos 44 - 121 IU/L 91   AST 0 - 40 IU/L 23   ALT 0 - 32 IU/L 14       Latest Ref Rng & Units 10/07/2021   10:09 AM  CBC  WBC 3.4 - 10.8 x10E3/uL 6.3   Hemoglobin 11.1 - 15.9 g/dL 13.1  Hematocrit 34.0 - 46.6 % 39.2   Platelets 150 - 450 x10E3/uL 191      Assessment and plan- Patient is a 71 y.o. 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  She is here for routine follow-up  Clinically patient is doing well with no concerning signs and symptoms of recurrence based on today's exam.  We are now nearing 4-1/2 years of surveillance.  Patient will continue to remain on Arimidex for 10 years.  Patient has baseline osteopenia and will need a repeat bone density scan in February 2024 which I will order.  I will see her back in 6 months no labs   Visit Diagnosis 1. Encounter for follow-up surveillance of breast cancer   2. Visit for monitoring Arimidex therapy   3. Osteopenia of right forearm      Dr. Randa Evens, MD, MPH [facility] at [facility] [phone] 01/04/2022 1:12 PM

## 2022-02-21 IMAGING — CR DG HIP (WITH OR WITHOUT PELVIS) 2-3V*R*
1 series · 3 of 3 positions shown · non-contrast
Comparison: CT abdomen pelvis 03/29/2018.

CLINICAL DATA: right hip pain , x 12 weeks no injury - trauma
history of breast cancer.

EXAM:
DG HIP (WITH OR WITHOUT PELVIS) 2-3V RIGHT

[Series 1: dg hip unilat w or w/o pelvis 2-3 views  · non-contrast · 0.14mm/px · 3 of 3 slices shown]
[im 1/3]
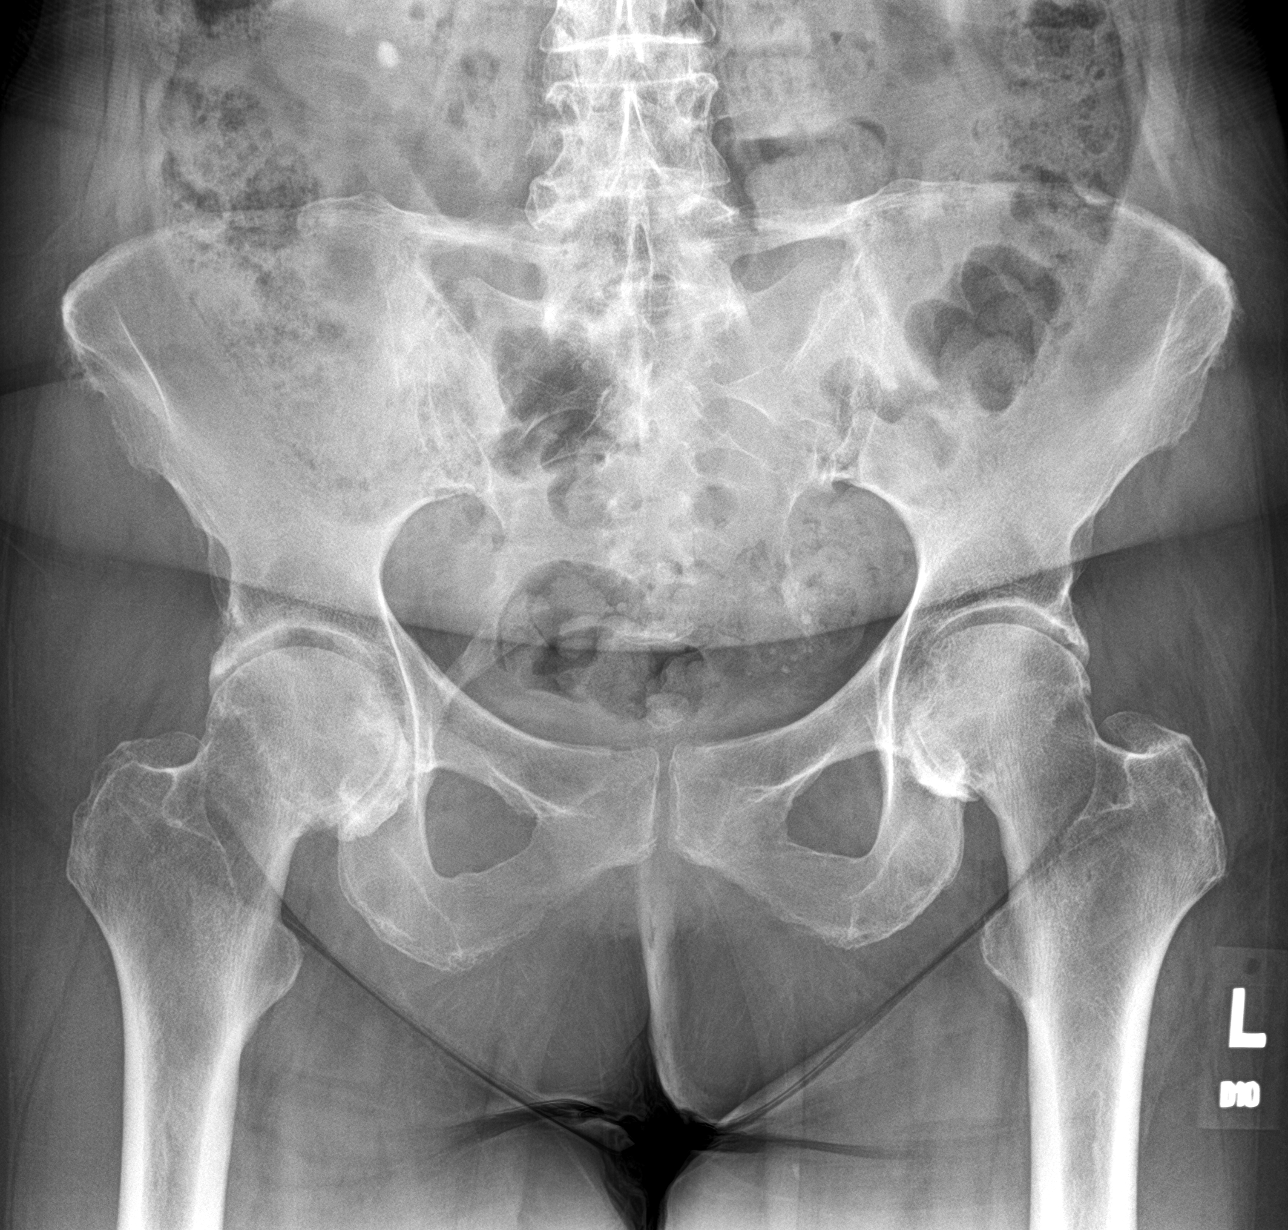
[im 2/3]
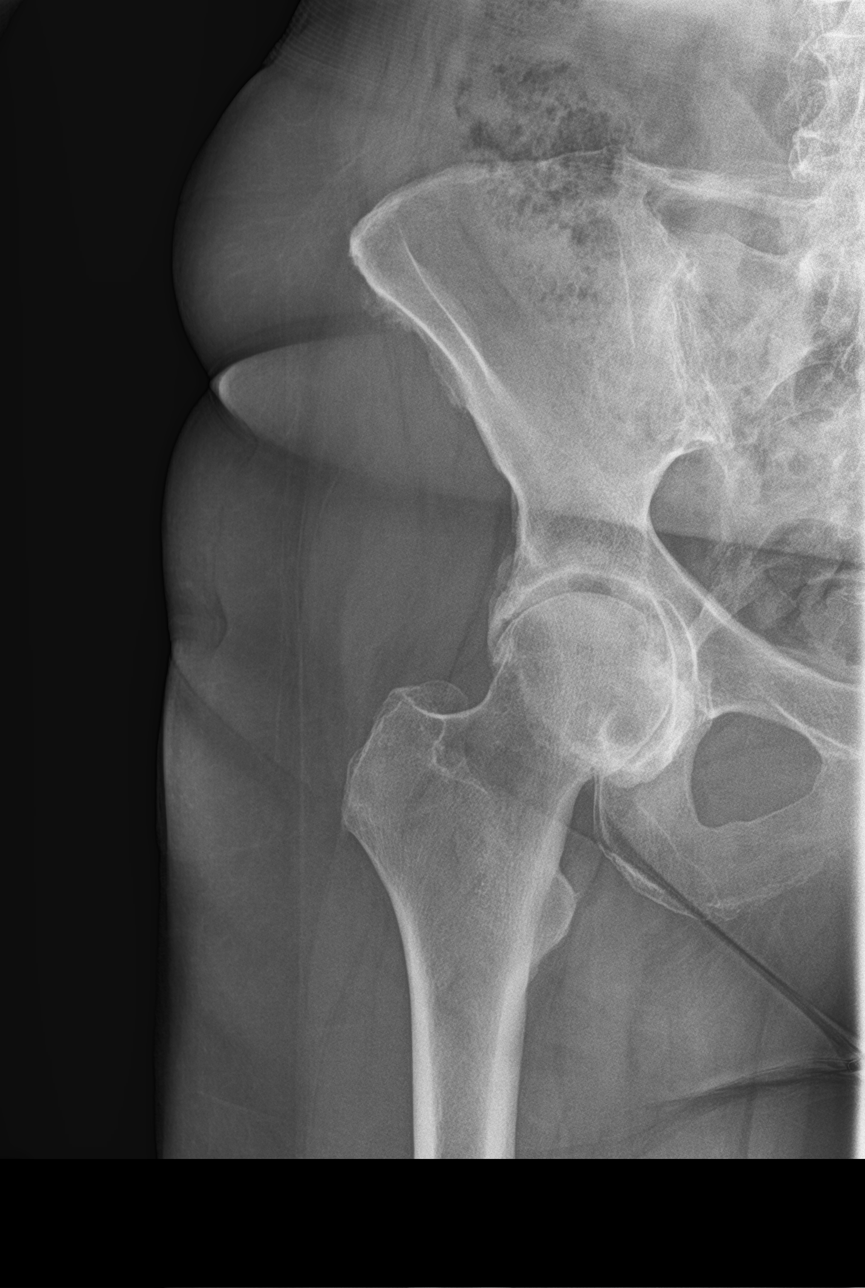
[im 3/3]
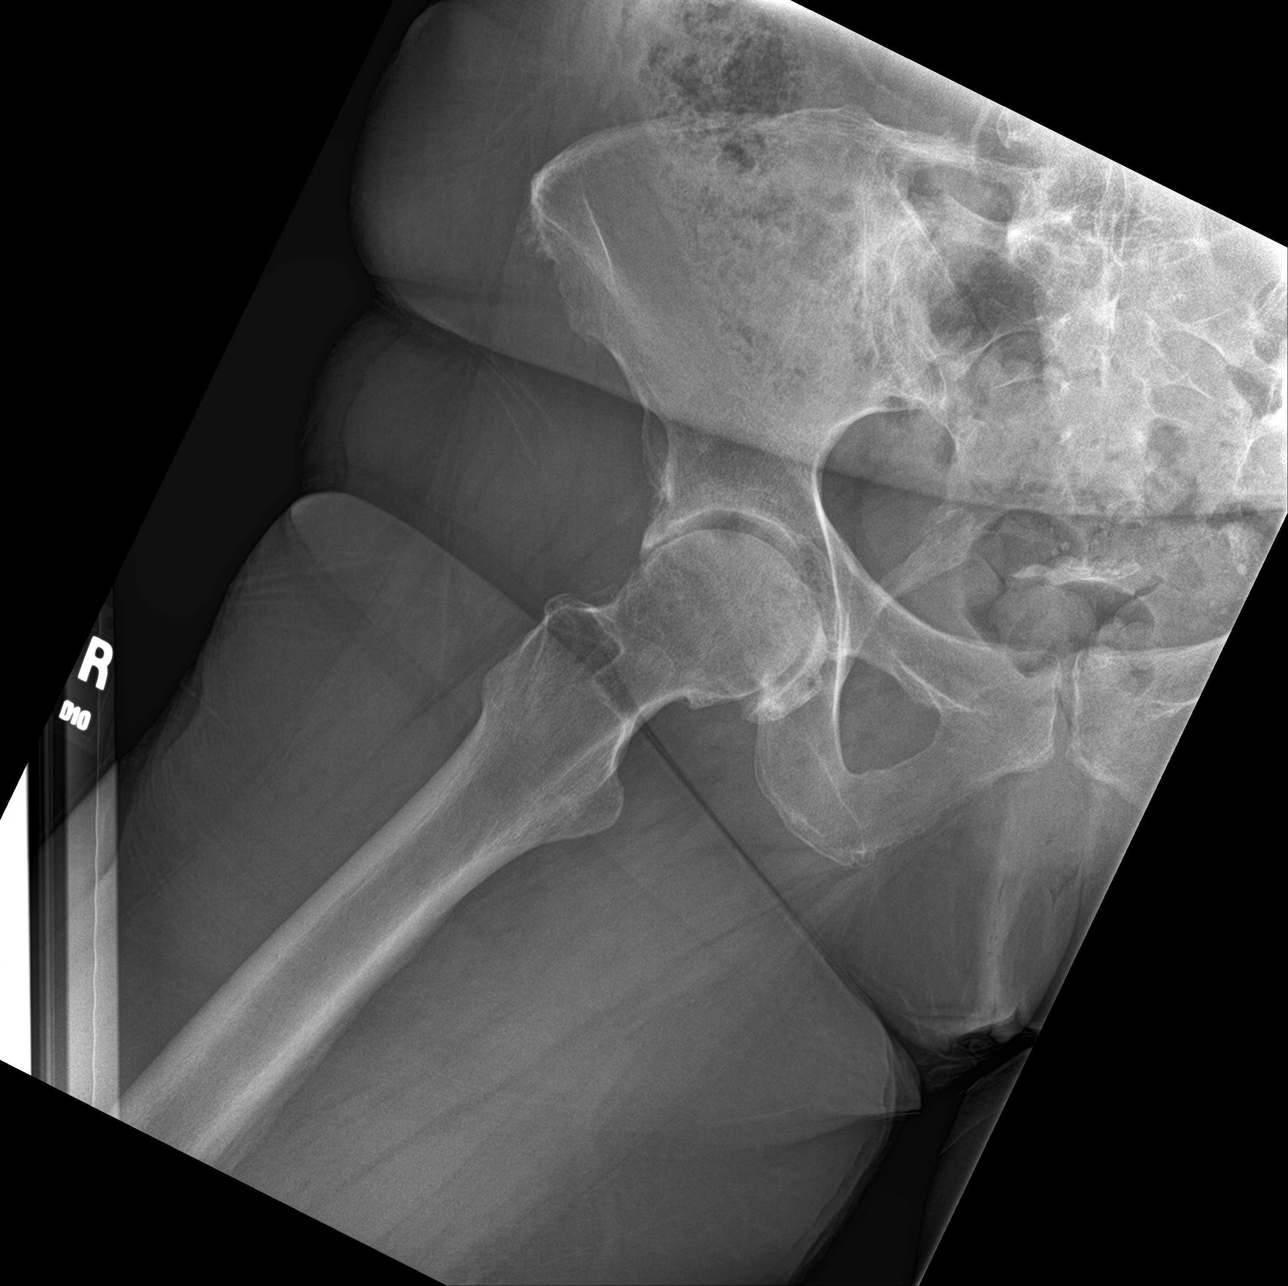

[3 of 3 positions shown; findings below may reference images not displayed]

FINDINGS: There is no evidence of hip fracture or dislocation of the right
hip. Similar-appearing degenerative changes of bilateral hips on the
frontal view. Otherwise the frontal view of the left hip is grossly
unremarkable. Frontal view of the pelvis is otherwise grossly
unremarkable. Redemonstration of calcification along the
sacrotuberous ligaments on the right. There is no evidence of
arthropathy or other focal bone abnormality.

There is a 9 mm calcification overlying the inferior right renal
shadow that correlates with a known calcified stone. Adjacent 3 mm
calcification slightly more superiorly could represent another
stone. Stool is noted throughout the visualized colon.
IMPRESSION: 1. Calcification of the right sacrotuberous ligament. Correlate with
clinical history and physical exam as this could be an etiology for
right hip pain.
2. No acute displaced fracture or dislocation of the right hip in a
patient with bilateral mild to moderate degenerative changes of the
hips.
3. Right nephrolithiasis.

## 2022-03-31 ENCOUNTER — Encounter: Payer: Self-pay | Admitting: Family Medicine

## 2022-03-31 ENCOUNTER — Ambulatory Visit (INDEPENDENT_AMBULATORY_CARE_PROVIDER_SITE_OTHER): Payer: Medicare HMO | Admitting: Family Medicine

## 2022-03-31 VITALS — BP 126/74 | HR 57 | Temp 97.7°F | Resp 16 | Ht 66.0 in | Wt 204.7 lb

## 2022-03-31 DIAGNOSIS — M7502 Adhesive capsulitis of left shoulder: Secondary | ICD-10-CM

## 2022-03-31 DIAGNOSIS — E559 Vitamin D deficiency, unspecified: Secondary | ICD-10-CM | POA: Diagnosis not present

## 2022-03-31 DIAGNOSIS — Z6831 Body mass index (BMI) 31.0-31.9, adult: Secondary | ICD-10-CM | POA: Diagnosis not present

## 2022-03-31 DIAGNOSIS — Z0000 Encounter for general adult medical examination without abnormal findings: Secondary | ICD-10-CM | POA: Diagnosis not present

## 2022-03-31 DIAGNOSIS — E538 Deficiency of other specified B group vitamins: Secondary | ICD-10-CM

## 2022-03-31 DIAGNOSIS — R609 Edema, unspecified: Secondary | ICD-10-CM

## 2022-03-31 DIAGNOSIS — E669 Obesity, unspecified: Secondary | ICD-10-CM

## 2022-03-31 DIAGNOSIS — I10 Essential (primary) hypertension: Secondary | ICD-10-CM

## 2022-03-31 DIAGNOSIS — L52 Erythema nodosum: Secondary | ICD-10-CM

## 2022-03-31 MED ORDER — HYDROCHLOROTHIAZIDE 12.5 MG PO TABS: 12.5000 mg | ORAL_TABLET | Freq: Every day | ORAL | 3 refills | Status: DC

## 2022-03-31 MED ORDER — NAPROXEN 500 MG PO TABS: 500.0000 mg | ORAL_TABLET | Freq: Two times a day (BID) | ORAL | 1 refills | Status: DC

## 2022-03-31 NOTE — Progress Notes (Signed)
I,Sulibeya S Dimas,acting as a Education administrator for Lavon Paganini, MD.,have documented all relevant documentation on the behalf of Lavon Paganini, MD,as directed by  Lavon Paganini, MD while in the presence of Lavon Paganini, MD.    Annual Wellness Visit     Patient: Meredith Hamilton, Female    DOB: 1950/05/01, 71 y.o.   MRN: [id number] Visit Date: 03/31/2022  Today's Provider: Lavon Paganini, MD   Chief Complaint  Patient presents with   Medicare Wellness   Leg Pain   Foot Pain   Joint Pain   Subjective    Miller Edgington is a 71 y.o. female who presents today for her Annual Wellness Visit. She reports consuming a Gluten free diet. Home exercise routine includes walking. She generally feels fairly well. She reports sleeping poorly. She does have additional problems to discuss today.   HPI Patient C/O leg and feet pain worsening x 1 month. She reports hard spots on her legs and color changes. Patient reports swelling.   Patient C/O multiple joint pain, shoulder, and hips. B/l shoulders, R hip, R knee mostly. Meloxicam wasn't helpful - no longer taking.  Interrupting sleep and function.   Medications: Outpatient Medications Prior to Visit  Medication Sig   anastrozole (ARIMIDEX) 1 MG tablet TAKE 1 TABLET(1 MG) BY MOUTH DAILY   BLACK COHOSH PO Take by mouth. 2 Tsp daily   Calcium-Magnesium (CAL-MAG PO) Take by mouth.   Cholecalciferol (VITAMIN D3) LIQD Take 6 drops by mouth daily.   OVER THE COUNTER MEDICATION Take 4 Scoops by mouth daily. Pecta Sol-C (Mod. Citrus Pectin)   OVER THE COUNTER MEDICATION Take 2 Scoops by mouth daily. Fermented Mushroom Blend   OVER THE COUNTER MEDICATION Take 6 capsules by mouth daily. Cruciferous Complete   OVER THE COUNTER MEDICATION Nitric Balance 4 tsp daily   triamcinolone ointment (KENALOG) 0.5 % Apply 1 application. topically 2 (two) times daily.   UNABLE TO FIND Take 10 drops by mouth daily. Med Name: Liquid Vitamin A   UNABLE TO FIND Med  Name: Hydrex 2 vials daily   UNABLE TO FIND Med Name: Adrena Calm 1/4 pump daily   UNABLE TO FIND Med Name: BIOST skeletal and cellular Health 6 capsules daily   [place] Name: A-C Carbamide 3 daily   UNABLE TO FIND Med Name: Derma Co 1 daily   [DISCONTINUED] amLODipine (NORVASC) 10 MG tablet TAKE 1 TABLET(10 MG) BY MOUTH DAILY   [DISCONTINUED] meloxicam (MOBIC) 15 MG tablet Take 1 tablet (15 mg total) by mouth daily. (Patient not taking: Reported on 03/31/2022)   No facility-administered medications prior to visit.    Allergies  Allergen Reactions   Other Rash   Sulfa Antibiotics Swelling   Tramadol Other (See Comments)    Other reaction(s): Hallucination    Patient Care Team: Virginia Crews, MD as PCP - General (Family Medicine) Johny Blamer, [place] (Chiropractic Medicine) Debbora Dus, NP (Nurse Practitioner) Trinna Post, PA-C (Inactive) as Physician Assistant (Physician Assistant) Sindy Guadeloupe, MD as Consulting Physician (Oncology)  Review of Systems  Cardiovascular:  Positive for leg swelling.  Musculoskeletal:  Positive for arthralgias, back pain, gait problem, joint swelling and myalgias.  Psychiatric/Behavioral:  Positive for sleep disturbance.   All other systems reviewed and are negative.  Last CBC Lab Results  Component Value Date   WBC 6.3 10/07/2021   HGB 13.1 10/07/2021   HCT 39.2 10/07/2021   MCV 94 10/07/2021   MCH 31.3 10/07/2021  RDW 12.6 10/07/2021   PLT 191 36/14/4315   Last metabolic panel Lab Results  Component Value Date   GLUCOSE 102 (H) 10/07/2021   NA 140 10/07/2021   K 3.9 10/07/2021   CL 104 10/07/2021   CO2 20 10/07/2021   BUN 12 10/07/2021   CREATININE 0.77 10/07/2021   EGFR 81 10/07/2021   CALCIUM 9.6 10/07/2021   PHOS 3.9 01/16/2018   PROT 7.4 10/07/2021   ALBUMIN 4.9 (H) 10/07/2021   LABGLOB 2.5 10/07/2021   AGRATIO 2.0 10/07/2021   BILITOT 0.5 10/07/2021   ALKPHOS 91 10/07/2021   AST 23  10/07/2021   ALT 14 10/07/2021   ANIONGAP 11 06/04/2020   Last lipids Lab Results  Component Value Date   CHOL 155 10/07/2021   HDL 64 10/07/2021   LDLCALC 76 10/07/2021   TRIG 79 10/07/2021   CHOLHDL 2.4 10/07/2021   Last hemoglobin A1c Lab Results  Component Value Date   HGBA1C 5.4 10/07/2021   Last thyroid functions Lab Results  Component Value Date   TSH 2.850 10/07/2021   T3TOTAL 104 01/16/2018   Last vitamin D Lab Results  Component Value Date   VD25OH 46.4 02/27/2020   Last vitamin B12 and Folate Lab Results  Component Value Date   VITAMINB12 136 (L) 10/07/2021        Objective    Vitals: BP 126/74 (BP Location: Right Arm, Patient Position: Sitting, Cuff Size: Large)   Pulse (!) 57   Temp 97.7 F (36.5 C) (Oral)   Resp 16   Ht _0  (1.676 m)   Wt 204 lb 11.2 oz (92.9 kg)   BMI 33.04 kg/m  BP Readings from Last 3 Encounters:  03/31/22 126/74  01/04/22 (!) 141/60  10/07/21 120/71   Wt Readings from Last 3 Encounters:  03/31/22 204 lb 11.2 oz (92.9 kg)  01/04/22 209 lb 12.8 oz (95.2 kg)  10/07/21 206 lb 14.4 oz (93.8 kg)       Physical Exam Vitals reviewed.  Constitutional:      General: She is not in acute distress.    Appearance: Normal appearance. She is well-developed. She is not diaphoretic.  HENT:     Head: Normocephalic and atraumatic.     Right Ear: Tympanic membrane, ear canal and external ear normal.     Left Ear: Tympanic membrane, ear canal and external ear normal.     Nose: Nose normal.     Mouth/Throat:     Mouth: Mucous membranes are moist.     Pharynx: Oropharynx is clear. No oropharyngeal exudate.  Eyes:     General: No scleral icterus.    Conjunctiva/sclera: Conjunctivae normal.     Pupils: Pupils are equal, round, and reactive to light.  Neck:     Thyroid: No thyromegaly.  Cardiovascular:     Rate and Rhythm: Normal rate and regular rhythm.     Heart sounds: Murmur heard.  Pulmonary:     Effort: Pulmonary  effort is normal. No respiratory distress.     Breath sounds: Normal breath sounds. No wheezing or rales.  Abdominal:     General: There is no distension.     Palpations: Abdomen is soft.     Tenderness: There is no abdominal tenderness.  Musculoskeletal:        General: No deformity.     Cervical back: Neck supple.     Right lower leg: Edema present.     Left lower leg: Edema present.  Lymphadenopathy:  Cervical: No cervical adenopathy.  Skin:    General: Skin is warm and dry.     Comments: Spider veins R ankle, Round, erythematous nodules on L shin with TTP  Neurological:     Mental Status: She is alert and oriented to person, place, and time. Mental status is at baseline.     Sensory: No sensory deficit.     Motor: No weakness.     Gait: Gait normal.  Psychiatric:        Mood and Affect: Mood normal.        Behavior: Behavior normal.        Thought Content: Thought content normal.     Most recent functional status assessment:    03/31/2022    2:45 PM  In your present state of health, do you have any difficulty performing the following activities:  Hearing? 0  Vision? 0  Difficulty concentrating or making decisions? 0  Walking or climbing stairs? 1  Dressing or bathing? 0  Doing errands, shopping? 0   Most recent fall risk assessment:    03/31/2022    2:44 PM  Fall Risk   Falls in the past year? 0  Number falls in past yr: 0  Injury with Fall? 0  Risk for fall due to : No Fall Risks  Follow up Falls evaluation completed    Most recent depression screenings:    03/31/2022    2:44 PM 03/02/2021   10:33 AM  PHQ 2/9 Scores  PHQ - 2 Score 0 0  PHQ- 9 Score 1    Most recent cognitive screening:    02/27/2020    9:16 AM  6CIT Screen  What Year? 0 points  What month? 0 points  What time? 0 points  Count back from 20 0 points  Months in reverse 0 points  Repeat phrase 0 points  Total Score 0 points   Most recent Audit-C alcohol use screening     03/31/2022    2:45 PM  Alcohol Use Disorder Test (AUDIT)  1. How often do you have a drink containing alcohol? 0  2. How many drinks containing alcohol do you have on a typical day when you are drinking? 0  3. How often do you have six or more drinks on one occasion? 0  AUDIT-C Score 0   A score of 3 or more in women, and 4 or more in men indicates increased risk for alcohol abuse, EXCEPT if all of the points are from question 1   No results found for any visits on 03/31/22.  Assessment & Plan     Annual wellness visit done today including the all of the following: Reviewed patient's Family Medical History Reviewed and updated list of patient's medical providers Assessment of cognitive impairment was done Assessed patient's functional ability Established a written schedule for health screening [place] Completed and Reviewed  Exercise Activities and Dietary recommendations  Goals      DIET - REDUCE SUGAR INTAKE     Continue current diet plan of cutting out sugar in daily diet to help aid in weight loss.      Exercise 3x per week (30 min per time)     Recommend to exercise for 3 days a week for at least 30 minutes at a time.          There is no immunization history on file for this patient.  Health Maintenance  Topic Date Due   COVID-19 Vaccine (1)  Never done   DTaP/Tdap/Td (1 - Tdap) Never done   Zoster Vaccines- Shingrix (1 of 2) Never done   COLONOSCOPY (Pts 45-68yr Insurance coverage will need to be confirmed)  Never done   INFLUENZA VACCINE  07/10/2022 (Originally 11/09/2021)   Pneumonia Vaccine 73 Years old (1 - PCV) 10/08/2022 (Originally 05/31/2013)   DEXA SCAN  05/14/2022   MAMMOGRAM  07/09/2022   Medicare Annual Wellness (AWV)  04/01/2023   Hepatitis C Screening  Completed   HPV VACCINES  Aged Out     Discussed health benefits of physical activity, and encouraged her to engage in regular exercise appropriate for her age and  condition.    Problem List Items Addressed This Visit       Cardiovascular and Mediastinum   Essential (primary) hypertension    Well controlled Wil lstop amlodipine as it may be contributing to edema Trial of hctz 12.5 mg daily instead Recheck metabolic panel F/u in 1 months       Relevant Medications   hydrochlorothiazide (HYDRODIURIL) 12.5 MG tablet   Other Relevant Orders   Basic Metabolic Panel (BMET)     Musculoskeletal and Integument   Erythema nodosum    New problem Concern for erythema nodosum No recent infection Referral to Rheum for further eval      Relevant Orders   Ambulatory referral to Rheumatology   Adhesive capsulitis of left shoulder    New problem 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 L shoulder is frozen and unable to abduct past 90 degrees Offered referral to PT and SM, but patient will work with her chiropractor first Naproxen prn Return precautions discussed      Relevant Medications   naproxen (NAPROSYN) 500 MG tablet     Other   Obesity    Discussed importance of healthy weight management Discussed diet and exercise       Vitamin D insufficiency   Relevant Orders   VITAMIN D 25 Hydroxy (Vit-D Deficiency, Fractures)   Vitamin B12 deficiency   Relevant Orders   B12   Dependent edema    Ongoing issue Now with spider veins Encouraged compression stockings and elevation Switch from amlodipine to hctz as above F/u in 1 m      Other Visit Diagnoses     Encounter for annual wellness visit (AWV) in Medicare patient    -  Primary   Encounter for annual physical exam       Relevant Orders   BJ03  Basic Metabolic Panel (BMET)   VITAMIN D 25 Hydroxy (Vit-D Deficiency, Fractures)        Return in about 4 weeks (around 04/28/2022) for BP f/u.     I, ALavon Paganini MD, have reviewed all documentation for this visit. The documentation on 03/31/22 for the exam, diagnosis, procedures, and orders are all accurate and  complete.   Sinda Leedom, ADionne Bucy MD, MPH [facility]

## 2022-03-31 NOTE — Assessment & Plan Note (Signed)
Discussed importance of healthy weight management Discussed diet and exercise  

## 2022-03-31 NOTE — Assessment & Plan Note (Signed)
Well controlled Wil lstop amlodipine as it may be contributing to edema Trial of hctz 12.5 mg daily instead Recheck metabolic panel F/u in 1 months

## 2022-03-31 NOTE — Assessment & Plan Note (Signed)
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

## 2022-03-31 NOTE — Assessment & Plan Note (Signed)
Ongoing issue Now with spider veins Encouraged compression stockings and elevation Switch from amlodipine to hctz as above F/u in 1 m

## 2022-03-31 NOTE — Assessment & Plan Note (Signed)
New problem Concern for erythema nodosum No recent infection Referral to Rheum for further eval

## 2022-04-01 LAB — BASIC METABOLIC PANEL
BUN/Creatinine Ratio: 14 (ref 12–28)
BUN: 12 mg/dL (ref 8–27)
CO2: 18 mmol/L — ABNORMAL LOW (ref 20–29)
Calcium: 9.7 mg/dL (ref 8.7–10.3)
Chloride: 107 mmol/L — ABNORMAL HIGH (ref 96–106)
Creatinine, Ser: 0.85 mg/dL (ref 0.57–1.00)
Glucose: 96 mg/dL (ref 70–99)
Potassium: 3.3 mmol/L — ABNORMAL LOW (ref 3.5–5.2)
Sodium: 144 mmol/L (ref 134–144)
eGFR: 72 mL/min/{1.73_m2} (ref >59)

## 2022-04-01 LAB — VITAMIN D 25 HYDROXY (VIT D DEFICIENCY, FRACTURES): Vit D, 25-Hydroxy: 33.7 ng/mL (ref 30.0–100.0)

## 2022-04-01 LAB — VITAMIN B12: Vitamin B-12: 1205 pg/mL (ref 232–1245)

## 2022-04-12 ENCOUNTER — Telehealth: Payer: Self-pay

## 2022-04-12 NOTE — Telephone Encounter (Signed)
Copied from [place] [id number]. Topic: Referral - Question >> Apr 12, 2022 11:20 AM Oley Balm A wrote: Reason for CRM: Patient states that Dr. Posey Pronto is needing the referral sent back over to him for patients referral. Referral was sent on 03/31/22. Please advise

## 2022-05-05 ENCOUNTER — Telehealth: Payer: Medicare HMO | Admitting: Family Medicine

## 2022-05-10 ENCOUNTER — Encounter: Payer: Self-pay | Admitting: Family Medicine

## 2022-05-10 ENCOUNTER — Ambulatory Visit (INDEPENDENT_AMBULATORY_CARE_PROVIDER_SITE_OTHER): Payer: Medicare HMO | Admitting: Family Medicine

## 2022-05-10 VITALS — BP 102/65 | HR 61 | Temp 98.1°F | Resp 16 | Ht 66.0 in | Wt 196.6 lb

## 2022-05-10 DIAGNOSIS — I10 Essential (primary) hypertension: Secondary | ICD-10-CM | POA: Diagnosis not present

## 2022-05-10 DIAGNOSIS — E876 Hypokalemia: Secondary | ICD-10-CM | POA: Diagnosis not present

## 2022-05-10 NOTE — Progress Notes (Signed)
I,Sulibeya S Dimas,acting as a Education administrator for Lavon Paganini, MD.,have documented all relevant documentation on the behalf of Lavon Paganini, MD,as directed by  Lavon Paganini, MD while in the presence of Lavon Paganini, MD.     Established patient visit   Patient: Meredith Hamilton   DOB: [birth year]   71 y.o. Female  MRN: [id number] Visit Date: 05/10/2022  Today's healthcare provider: Lavon Paganini, MD   Chief Complaint  Patient presents with   Hypertension   Subjective    HPI  Hypertension, follow-up  BP Readings from Last 3 Encounters:  05/10/22 102/65  03/31/22 126/74  01/04/22 (!) 141/60   Wt Readings from Last 3 Encounters:  05/10/22 196 lb 9.6 oz (89.2 kg)  03/31/22 204 lb 11.2 oz (92.9 kg)  01/04/22 209 lb 12.8 oz (95.2 kg)     She was last seen for hypertension 1 months ago.  BP at that visit was 126/74. Management since that visit includes stop amlodipine. Trial of hctz 12.5 mg daily instead. Lab- low potassium. Will recheck at next visit.   She reports excellent compliance with treatment. She is not having side effects.   Outside blood pressures are not being checked.  Pertinent labs Lab Results  Component Value Date   CHOL 155 10/07/2021   HDL 64 10/07/2021   LDLCALC 76 10/07/2021   TRIG 79 10/07/2021   CHOLHDL 2.4 10/07/2021   Lab Results  Component Value Date   NA 144 03/31/2022   K 3.3 (L) 03/31/2022   CREATININE 0.85 03/31/2022   EGFR 72 03/31/2022   GLUCOSE 96 03/31/2022   TSH 2.850 10/07/2021     The 10-year ASCVD risk score (Arnett DK, et al., 2019) is: 10.7%  ---------------------------------------------------------------------------------------------------   Medications: Outpatient Medications Prior to Visit  Medication Sig   anastrozole (ARIMIDEX) 1 MG tablet TAKE 1 TABLET(1 MG) BY MOUTH DAILY   BLACK COHOSH PO Take by mouth. 2 Tsp daily   Calcium-Magnesium (CAL-MAG PO) Take by mouth.   Cholecalciferol (VITAMIN D3) LIQD  Take 6 drops by mouth daily.   hydrochlorothiazide (HYDRODIURIL) 12.5 MG tablet Take 1 tablet (12.5 mg total) by mouth daily.   naproxen (NAPROSYN) 500 MG tablet Take 1 tablet (500 mg total) by mouth 2 (two) times daily with a meal.   OVER THE COUNTER MEDICATION Take 4 Scoops by mouth daily. Pecta Sol-C (Mod. Citrus Pectin)   OVER THE COUNTER MEDICATION Take 2 Scoops by mouth daily. Fermented Mushroom Blend   OVER THE COUNTER MEDICATION Take 6 capsules by mouth daily. Cruciferous Complete   OVER THE COUNTER MEDICATION Nitric Balance 4 tsp daily   triamcinolone ointment (KENALOG) 0.5 % Apply 1 application. topically 2 (two) times daily.   UNABLE TO FIND Take 10 drops by mouth daily. Med Name: Liquid Vitamin A   UNABLE TO FIND Med Name: Hydrex 2 vials daily   UNABLE TO FIND Med Name: Adrena Calm 1/4 pump daily   UNABLE TO FIND Med Name: BIOST skeletal and cellular Health 6 capsules daily   UNABLE TO FIND Med Name: A-C Carbamide 3 daily   UNABLE TO FIND Med Name: Derma Co 1 daily   No facility-administered medications prior to visit.    Review of Systems  Constitutional:  Negative for appetite change and fatigue.  Eyes:  Negative for visual disturbance.  Respiratory:  Negative for chest tightness and shortness of breath.   Cardiovascular:  Negative for chest pain and leg swelling.  Gastrointestinal:  Negative for abdominal pain, nausea and  vomiting.  Neurological:  Negative for dizziness, light-headedness and headaches.       Objective    BP 102/65 (BP Location: Right Arm, Patient Position: Sitting, Cuff Size: Large)   Pulse 61   Temp 98.1 F (36.7 C) (Temporal)   Resp 16   Ht '5\' 6"'$  (1.676 m)   Wt 196 lb 9.6 oz (89.2 kg)   BMI 31.73 kg/m    Physical Exam Vitals reviewed.  Constitutional:      General: She is not in acute distress.    Appearance: Normal appearance. She is well-developed. She is not diaphoretic.  HENT:     Head: Normocephalic and atraumatic.  Eyes:      General: No scleral icterus.    Conjunctiva/sclera: Conjunctivae normal.  Neck:     Thyroid: No thyromegaly.  Cardiovascular:     Rate and Rhythm: Normal rate and regular rhythm.     Heart sounds: Normal heart sounds. No murmur heard. Pulmonary:     Effort: Pulmonary effort is normal. No respiratory distress.     Breath sounds: Normal breath sounds. No wheezing, rhonchi or rales.  Musculoskeletal:     Cervical back: Neck supple.     Right lower leg: No edema.     Left lower leg: No edema.  Lymphadenopathy:     Cervical: No cervical adenopathy.  Skin:    General: Skin is warm and dry.     Findings: No rash.  Neurological:     Mental Status: She is alert and oriented to person, place, and time. Mental status is at baseline.  Psychiatric:        Mood and Affect: Mood normal.        Behavior: Behavior normal.       No results found for any visits on 05/10/22.  Assessment & Plan     Problem List Items Addressed This Visit       Cardiovascular and Mediastinum   Essential (primary) hypertension - Primary    Well controlled Continue current medications Edema resolved after stopping amlodipine Recheck metabolic panel F/u in 6 months       Relevant Orders   Basic metabolic panel   Other Visit Diagnoses     Hypokalemia       Relevant Orders   Basic metabolic panel        Return in about 6 months (around 11/08/2022) for chronic disease f/u.      I, Lavon Paganini, MD, have reviewed all documentation for this visit. The documentation on 05/10/22 for the exam, diagnosis, procedures, and orders are all accurate and complete.   Adriella Essex, Dionne Bucy, MD, MPH [facility]

## 2022-05-10 NOTE — Assessment & Plan Note (Signed)
Well controlled Continue current medications Edema resolved after stopping amlodipine Recheck metabolic panel F/u in 6 months

## 2022-05-11 LAB — BASIC METABOLIC PANEL
BUN/Creatinine Ratio: 21 (ref 12–28)
BUN: 16 mg/dL (ref 8–27)
CO2: 19 mmol/L — ABNORMAL LOW (ref 20–29)
Calcium: 9.9 mg/dL (ref 8.7–10.3)
Chloride: 106 mmol/L (ref 96–106)
Creatinine, Ser: 0.76 mg/dL (ref 0.57–1.00)
Glucose: 93 mg/dL (ref 70–99)
Potassium: 4.3 mmol/L (ref 3.5–5.2)
Sodium: 142 mmol/L (ref 134–144)
eGFR: 83 mL/min/{1.73_m2} (ref >59)

## 2022-05-16 ENCOUNTER — Other Ambulatory Visit: Payer: Medicare HMO

## 2022-05-26 ENCOUNTER — Ambulatory Visit
Admission: RE | Admit: 2022-05-26 | Discharge: 2022-05-26 | Disposition: A | Discharge status: Home / Self Care | Payer: Medicare HMO | Source: Ambulatory Visit | Attending: Oncology | Admitting: Oncology

## 2022-05-26 DIAGNOSIS — Z08 Encounter for follow-up examination after completed treatment for malignant neoplasm: Secondary | ICD-10-CM | POA: Diagnosis present

## 2022-05-26 DIAGNOSIS — M8589 Other specified disorders of bone density and structure, multiple sites: Secondary | ICD-10-CM | POA: Diagnosis not present

## 2022-05-26 DIAGNOSIS — Z13820 Encounter for screening for osteoporosis: Secondary | ICD-10-CM | POA: Insufficient documentation

## 2022-05-26 DIAGNOSIS — Z853 Personal history of malignant neoplasm of breast: Secondary | ICD-10-CM | POA: Diagnosis present

## 2022-05-26 DIAGNOSIS — Z780 Asymptomatic menopausal state: Secondary | ICD-10-CM | POA: Diagnosis not present

## 2022-06-01 ENCOUNTER — Other Ambulatory Visit: Payer: Self-pay | Admitting: Family Medicine

## 2022-06-01 NOTE — Telephone Encounter (Signed)
Requested Prescriptions  Pending Prescriptions Disp Refills   naproxen (NAPROSYN) 500 MG tablet [Pharmacy Med Name: NAPROXEN 500MG TABLETS] 180 tablet 1    Sig: TAKE 1 TABLET(500 MG) BY MOUTH TWICE DAILY WITH A MEAL     Analgesics:  NSAIDS Failed - 06/01/2022  5:10 PM      Failed - Manual Review: Labs are only required if the patient has taken medication for more than 8 weeks.      Passed - Cr in normal range and within 360 days    Creatinine, Ser  Date Value Ref Range Status  05/10/2022 0.76 0.57 - 1.00 mg/dL Final         Passed - HGB in normal range and within 360 days    Hemoglobin  Date Value Ref Range Status  10/07/2021 13.1 11.1 - 15.9 g/dL Final         Passed - PLT in normal range and within 360 days    Platelets  Date Value Ref Range Status  10/07/2021 191 150 - 450 x10E3/uL Final         Passed - HCT in normal range and within 360 days    Hematocrit  Date Value Ref Range Status  10/07/2021 39.2 34.0 - 46.6 % Final         Passed - eGFR is 30 or above and within 360 days    GFR calc Af Amer  Date Value Ref Range Status  02/27/2020 93 >59 mL/min/1.73 Final    Comment:    **In accordance with recommendations from the NKF-ASN Task force,**   [facility] is in the process of updating its eGFR calculation to the   2021 CKD-EPI creatinine equation that estimates kidney function   without a race variable.    GFR, Estimated  Date Value Ref Range Status  06/04/2020 >60 >60 mL/min Final    Comment:    (NOTE) Calculated using the CKD-EPI Creatinine Equation (2021)    eGFR  Date Value Ref Range Status  05/10/2022 83 >59 mL/min/1.73 Final         Passed - Patient is not pregnant      Passed - Valid encounter within last 12 months    Recent Outpatient Visits           3 weeks ago Essential (primary) hypertension   [place], Dionne Bucy, MD   2 months ago Encounter for annual wellness visit (AWV) in Medicare patient   [facility] Goodman, Dionne Bucy, MD   7 months ago Essential (primary) hypertension   [place] Mikey Kirschner, PA-C   9 months ago Trochanteric bursitis of right hip   [place], Dionne Bucy, MD   2 years ago Essential (primary) hypertension   [place], Dionne Bucy, MD       Future Appointments             In 5 months Bacigalupo, Dionne Bucy, MD [facility]

## 2022-07-05 ENCOUNTER — Inpatient Hospital Stay: Payer: Medicare HMO | Attending: Oncology | Admitting: Nurse Practitioner

## 2022-07-05 ENCOUNTER — Encounter: Payer: Self-pay | Admitting: Nurse Practitioner

## 2022-07-05 VITALS — BP 154/75 | HR 58 | Temp 97.9°F | Ht 66.0 in | Wt 204.0 lb

## 2022-07-05 DIAGNOSIS — Z79811 Long term (current) use of aromatase inhibitors: Secondary | ICD-10-CM | POA: Insufficient documentation

## 2022-07-05 DIAGNOSIS — Z170 Estrogen receptor positive status [ER+]: Secondary | ICD-10-CM | POA: Insufficient documentation

## 2022-07-05 DIAGNOSIS — Z9012 Acquired absence of left breast and nipple: Secondary | ICD-10-CM | POA: Diagnosis not present

## 2022-07-05 DIAGNOSIS — Z08 Encounter for follow-up examination after completed treatment for malignant neoplasm: Secondary | ICD-10-CM

## 2022-07-05 DIAGNOSIS — M8580 Other specified disorders of bone density and structure, unspecified site: Secondary | ICD-10-CM | POA: Insufficient documentation

## 2022-07-05 DIAGNOSIS — Z801 Family history of malignant neoplasm of trachea, bronchus and lung: Secondary | ICD-10-CM | POA: Diagnosis not present

## 2022-07-05 DIAGNOSIS — Z803 Family history of malignant neoplasm of breast: Secondary | ICD-10-CM | POA: Insufficient documentation

## 2022-07-05 DIAGNOSIS — Z5181 Encounter for therapeutic drug level monitoring: Secondary | ICD-10-CM

## 2022-07-05 DIAGNOSIS — C50912 Malignant neoplasm of unspecified site of left female breast: Secondary | ICD-10-CM | POA: Insufficient documentation

## 2022-07-05 NOTE — Progress Notes (Signed)
Hematology/Oncology Consult Note [facility]  [phone] [phone]  Patient Care Team: Virginia Crews, MD as PCP - General (Family Medicine) Johny Blamer, [place] (Chiropractic Medicine) Debbora Dus, NP (Nurse Practitioner) Paulene Floor (Inactive) as Physician Assistant (Physician Assistant) Sindy Guadeloupe, MD as Consulting Physician (Oncology)   Name of the patient: Meredith Hamilton  DC:[id number]  1951-03-27   Date of visit: 07/05/22  Diagnosis- h/o left breast cancer pathological prognostic stage IB T2N2M0 ER PR positive her 2 negative s/p mastectomy and axillary LN dissection  Chief complaint/ Reason for visit-routine follow-up of breast cancer, on Arimidex  Heme/Onc history: Patient is a 72 year old female who was found to have an abnormal screening mammogram at an outside facility in [place] in February 2019.  This was followed by a mastectomy and a sentinel lymph node biopsy in April 2019.  Intraoperative frozen section of the lymph node was positive and this was converted to an axillary lymph node dissection.  Final pathology revealed mixed pathology of invasive mammary as well as invasive lobular carcinoma.  There were 2 foci measuring 2.1 and 0.8 cm.  Overall grade 2.  ER PR strongly positive greater than 90% and HER-2 was negative.  5 out of 15 axillary lymph nodes were positive for metastatic disease with the largest deposit being 1.8 cm and extranodal extension was present.  Perineural invasion was identified.  No LV I.  Final margins were clear pathological stage T2N2A.  She was recommended adjuvant chemotherapy and postmastectomy radiation.  However patient did not want to go through it and eventually moved her care to [facility] [address].  She has also been seen by surgical oncology in [facility] recently in July 2019.  She has been pursuing alternative therapy with vitamin C infusions as well as other nutritional supplements.      Patient was started on Arimidex in November 2019  Interval history- Patient is 72 year old female with above history of breast cancer and mastectomy who returns to clinic for routine surveillance and follow up. She continues arimidex. Tolerating well. Has some joint aches and pains and has recently seen rheumatology. 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 No breast or chest pain, skin changes, or nodularity. She feels well and denies other complaints.   ECOG PS- 1 Pain scale- 0   Review of systems- Review of Systems  Constitutional:  Negative for chills, fever, malaise/fatigue and weight loss.  HENT:  Negative for congestion, ear discharge and nosebleeds.   Eyes:  Negative for blurred vision.  Respiratory:  Negative for cough, hemoptysis, sputum production, shortness of breath and wheezing.   Cardiovascular:  Negative for chest pain, palpitations, orthopnea and claudication.  Gastrointestinal:  Negative for abdominal pain, blood in stool, constipation, diarrhea, heartburn, melena, nausea and vomiting.  Genitourinary:  Negative for dysuria, flank pain, frequency, hematuria and urgency.  Musculoskeletal:  Positive for joint pain. Negative for back pain and myalgias.  Skin:  Negative for rash.  Neurological:  Negative for dizziness, tingling, focal weakness, seizures, weakness and headaches.  Endo/Heme/Allergies:  Does not bruise/bleed easily.  Psychiatric/Behavioral:  Negative for depression and suicidal ideas. The patient does not have insomnia.      Allergies  Allergen Reactions   Other Rash   Sulfa Antibiotics Swelling   Tramadol Other (See Comments)    Other reaction(s): Hallucination     Past Medical History:  Diagnosis Date   Breast CA ([place])    Family history of breast cancer  Family history of lung cancer    Family history of throat cancer    Hashimoto's disease    Hypertension    Non-celiac gluten sensitivity      Past Surgical History:  Procedure  Laterality Date   Jacumba W/ SENTINEL NODE BIOPSY Left 2019   RIGHT OOPHORECTOMY  1979   due to ovarian torsion   URETEROSCOPY Right 2017    Social History   Socioeconomic History   Marital status: Divorced    Spouse name: Not on file   Number of children: 2   Years of education: 12   Highest education level: High school graduate  Occupational History   Occupation: retired Sales promotion account executive for [employer]  Tobacco Use   Smoking status: Never   Smokeless tobacco: Never  Vaping Use   Vaping Use: Never used  Substance and Sexual Activity   Alcohol use: Not Currently   Drug use: Never   Sexual activity: Not Currently  Other Topics Concern   Not on file  Social History Narrative   Not on file   Social Determinants of Health   Financial Resource Strain: Low Risk  (03/02/2021)   Overall Financial Resource Strain (CARDIA)    Difficulty of Paying Living Expenses: Not very hard  Food Insecurity: No Food Insecurity (03/02/2021)   Hunger Vital Sign    Worried About Running Out of Food in the Last Year: Never true    Ran Out of Food in the Last Year: Never true  Transportation Needs: No Transportation Needs (03/02/2021)   PRAPARE - Hydrologist (Medical): No    Lack of Transportation (Non-Medical): No  Physical Activity: Insufficiently Active (03/02/2021)   Exercise Vital Sign    Days of Exercise per Week: 3 days    Minutes of Exercise per Session: 30 min  Stress: No Stress Concern Present (03/02/2021)   [place]    Feeling of Stress : Only a little  Social Connections: Moderately Isolated (03/02/2021)   Social Connection and Isolation Panel [NHANES]    Frequency of Communication with Friends and Family: Three times a week    Frequency of Social Gatherings with Friends and Family: More than three times a week    Attends  Religious Services: More than 4 times per year    Active Member of [employer] or Organizations: No    Attends Archivist Meetings: Never    Marital Status: Divorced  Human resources officer Violence: Not At Risk (03/02/2021)   Humiliation, Afraid, Rape, and Kick questionnaire    Fear of Current or Ex-Partner: No    Emotionally Abused: No    Physically Abused: No    Sexually Abused: No    Family History  Problem Relation Age of Onset   Lung cancer Mother 59       hx smoking   Lung cancer Father 32       hx smoking   Breast cancer Sister 3       in remission, now in mid 42s   Lung cancer Brother 22       smoker   Throat cancer Brother        alcohol   Colon cancer Neg Hx    Cervical cancer Neg Hx    Ovarian cancer Neg Hx      Current Outpatient Medications:    amLODipine (NORVASC) 10 MG tablet, Take  10 mg by mouth daily., Disp: , Rfl:    anastrozole (ARIMIDEX) 1 MG tablet, TAKE 1 TABLET(1 MG) BY MOUTH DAILY, Disp: 90 tablet, Rfl: 3   BLACK COHOSH PO, Take by mouth. 2 Tsp daily, Disp: , Rfl:    Calcium-Magnesium (CAL-MAG PO), Take by mouth., Disp: , Rfl:    Cholecalciferol (VITAMIN D3) LIQD, Take 6 drops by mouth daily., Disp: , Rfl:    hydrochlorothiazide (HYDRODIURIL) 12.5 MG tablet, Take 1 tablet (12.5 mg total) by mouth daily., Disp: 90 tablet, Rfl: 3   naproxen (NAPROSYN) 500 MG tablet, TAKE 1 TABLET(500 MG) BY MOUTH TWICE DAILY WITH A MEAL, Disp: 180 tablet, Rfl: 1   OVER THE COUNTER MEDICATION, Take 4 Scoops by mouth daily. Pecta Sol-C (Mod. Citrus Pectin), Disp: , Rfl:    OVER THE COUNTER MEDICATION, Take 2 Scoops by mouth daily. Fermented Mushroom Blend, Disp: , Rfl:    OVER THE COUNTER MEDICATION, Take 6 capsules by mouth daily. Cruciferous Complete, Disp: , Rfl:    OVER THE COUNTER MEDICATION, Nitric Balance 4 tsp daily, Disp: , Rfl:    triamcinolone ointment (KENALOG) 0.5 %, Apply 1 application. topically 2 (two) times daily., Disp: 60 g, Rfl: 1   UNABLE TO FIND,  Take 10 drops by mouth daily. Med Name: Liquid Vitamin A, Disp: , Rfl:    UNABLE TO FIND, Med Name: Hydrex 2 vials daily, Disp: , Rfl:    UNABLE TO FIND, Med Name: Adrena Calm 1/4 pump daily, Disp: , Rfl:    UNABLE TO FIND, Med Name: BIOST skeletal and cellular Health 6 capsules daily, Disp: , Rfl:    UNABLE TO FIND, Med Name: A-C Carbamide 3 daily, Disp: , Rfl:    UNABLE TO FIND, Med Name: Derma Co 1 daily, Disp: , Rfl:   Physical exam:  Vitals:   07/05/22 0956  BP: (!) 154/75  Pulse: (!) 58  Temp: 97.9 F (36.6 C)  TempSrc: Tympanic  Weight: 204 lb (92.5 kg)  Height: 5\' 6"  (1.676 m)   Physical Exam Constitutional:      General: She is not in acute distress.    Appearance: She is not ill-appearing.  Cardiovascular:     Rate and Rhythm: Normal rate and regular rhythm.  Pulmonary:     Effort: Pulmonary effort is normal.  Chest:     Comments: Breast exam: Patient is s/p left mastectomy without reconstruction.  No evidence of chest wall recurrence.  No palpable left axillary adenopathy.  No palpable masses in the right breast or right axillary adenopathy. Abdominal:     General: There is no distension.     Palpations: Abdomen is soft.     Tenderness: There is no abdominal tenderness.  Skin:    General: Skin is warm and dry.     Coloration: Skin is not pale.  Neurological:     Mental Status: She is alert and oriented to person, place, and time.  Psychiatric:        Mood and Affect: Mood normal.        Behavior: Behavior normal.    Imaging:  07/08/21- Mammo Screening Right CLINICAL INDICATION: 72 years old Female: SCREENING MAMMOGM HIGH - RISK PATIENT  - Z12.31 - Screening mammogram for breast cancer. History of left mastectomy.  COMPARISON: 2022 and priors  TECHNIQUE: Right unilateral full-field digital mammography, MLO and CC projections  BREAST DENSITY: b - There are scattered areas of fibroglandular density.  FINDINGS: There are no dominant masses, suspicious asymmetries  or calcifications, or unexplained areas of architectural distortion in the right breast.   ASSESSMENT:  BI-RADS Category: 1-Mammo1Yr : Negative.  Mammogram due in 1 year.  Recommendation Laterality: Right. Annual routine screening mammogram is recommended.    Assessment and plan- Patient is a 72 y.o. female with   History of stage Ib breast cancer of left breast-  pathological prognostic stage T2N2A grade 2 invasive mammary carcinoma with mixed ductal and lobular features ER and PR greater than 90% positive and HER-2/neu negative status post left mastectomy and axillary lymph node dissection.  She is here for routine follow-up. Clinically doing well. No evidence of recurrence on exam. No concerning symptoms. Tolerating arimidex well with plan for 10 years of treatment. Next month she will have completed 5 years. We can transition to annual surveillance. She will continue right breast screening with her PCP. Last mammo was 07/08/21 reported as bi-rads category 1. Next due 07/10/22.  Osteopenia- at baseline. At risk for increased bone loss on AI therapy. Bone density reviewed and remains stable. Repeat 05/27/2024.   Disposition:  1 year- see Dr Janese Banks for breast cancer surveillance - [place]   Visit Diagnosis 1. Encounter for follow-up surveillance of breast cancer   2. Encounter for monitoring aromatase inhibitor therapy    Beckey Rutter, DNP, AGNP-C, [place] at [facility] [phone] (clinic) 07/05/2022

## 2022-08-02 ENCOUNTER — Encounter (HOSPITAL_COMMUNITY): Payer: Self-pay

## 2022-08-02 ENCOUNTER — Observation Stay (HOSPITAL_COMMUNITY)
Admission: EM | Admit: 2022-08-02 | Discharge: 2022-08-05 | Disposition: A | Discharge status: Home / Self Care | Payer: Medicare HMO | Attending: Emergency Medicine | Admitting: Emergency Medicine

## 2022-08-02 ENCOUNTER — Emergency Department: Payer: Medicare HMO

## 2022-08-02 ENCOUNTER — Emergency Department
Admission: EM | Admit: 2022-08-02 | Discharge: 2022-08-02 | Disposition: A | Discharge status: Other Acute Inpatient Hospital | Payer: Medicare HMO | Attending: Emergency Medicine | Admitting: Emergency Medicine

## 2022-08-02 ENCOUNTER — Other Ambulatory Visit: Payer: Self-pay

## 2022-08-02 DIAGNOSIS — K8040 Calculus of bile duct with cholecystitis, unspecified, without obstruction: Secondary | ICD-10-CM

## 2022-08-02 DIAGNOSIS — K8042 Calculus of bile duct with acute cholecystitis without obstruction: Principal | ICD-10-CM

## 2022-08-02 DIAGNOSIS — K8012 Calculus of gallbladder with acute and chronic cholecystitis without obstruction: Secondary | ICD-10-CM | POA: Diagnosis not present

## 2022-08-02 DIAGNOSIS — Z853 Personal history of malignant neoplasm of breast: Secondary | ICD-10-CM | POA: Insufficient documentation

## 2022-08-02 DIAGNOSIS — R945 Abnormal results of liver function studies: Secondary | ICD-10-CM | POA: Insufficient documentation

## 2022-08-02 DIAGNOSIS — E876 Hypokalemia: Secondary | ICD-10-CM | POA: Insufficient documentation

## 2022-08-02 DIAGNOSIS — R001 Bradycardia, unspecified: Secondary | ICD-10-CM | POA: Insufficient documentation

## 2022-08-02 DIAGNOSIS — I10 Essential (primary) hypertension: Secondary | ICD-10-CM | POA: Insufficient documentation

## 2022-08-02 DIAGNOSIS — K5710 Diverticulosis of small intestine without perforation or abscess without bleeding: Secondary | ICD-10-CM | POA: Insufficient documentation

## 2022-08-02 DIAGNOSIS — R7989 Other specified abnormal findings of blood chemistry: Secondary | ICD-10-CM | POA: Diagnosis present

## 2022-08-02 DIAGNOSIS — K838 Other specified diseases of biliary tract: Secondary | ICD-10-CM | POA: Diagnosis not present

## 2022-08-02 DIAGNOSIS — Z79899 Other long term (current) drug therapy: Secondary | ICD-10-CM | POA: Diagnosis not present

## 2022-08-02 DIAGNOSIS — R1011 Right upper quadrant pain: Secondary | ICD-10-CM | POA: Diagnosis present

## 2022-08-02 LAB — CBC WITH DIFFERENTIAL/PLATELET
Abs Immature Granulocytes: 0.03 10*3/uL (ref 0.00–0.07)
Basophils Absolute: 0 10*3/uL (ref 0.0–0.1)
Basophils Relative: 1 %
Eosinophils Absolute: 0.1 10*3/uL (ref 0.0–0.5)
Eosinophils Relative: 1 %
HCT: 37.7 % (ref 36.0–46.0)
Hemoglobin: 12.4 g/dL (ref 12.0–15.0)
Immature Granulocytes: 0 %
Lymphocytes Relative: 22 %
Lymphs Abs: 1.9 10*3/uL (ref 0.7–4.0)
MCH: 31.5 pg (ref 26.0–34.0)
MCHC: 32.9 g/dL (ref 30.0–36.0)
MCV: 95.7 fL (ref 80.0–100.0)
Monocytes Absolute: 0.5 10*3/uL (ref 0.1–1.0)
Monocytes Relative: 6 %
Neutro Abs: 5.9 10*3/uL (ref 1.7–7.7)
Neutrophils Relative %: 70 %
Platelets: 195 10*3/uL (ref 150–400)
RBC: 3.94 MIL/uL (ref 3.87–5.11)
RDW: 12.9 % (ref 11.5–15.5)
WBC: 8.5 10*3/uL (ref 4.0–10.5)
nRBC: 0 % (ref 0.0–0.2)

## 2022-08-02 LAB — COMPREHENSIVE METABOLIC PANEL
ALT: 108 U/L — ABNORMAL HIGH (ref 0–44)
AST: 132 U/L — ABNORMAL HIGH (ref 15–41)
Albumin: 4.3 g/dL (ref 3.5–5.0)
Alkaline Phosphatase: 117 U/L (ref 38–126)
Anion gap: 14 (ref 5–15)
BUN: 25 mg/dL — ABNORMAL HIGH (ref 8–23)
CO2: 22 mmol/L (ref 22–32)
Calcium: 9.1 mg/dL (ref 8.9–10.3)
Chloride: 104 mmol/L (ref 98–111)
Creatinine, Ser: 0.93 mg/dL (ref 0.44–1.00)
GFR, Estimated: >60 mL/min (ref >60)
Glucose, Bld: 154 mg/dL — ABNORMAL HIGH (ref 70–99)
Potassium: 2.8 mmol/L — ABNORMAL LOW (ref 3.5–5.1)
Sodium: 140 mmol/L (ref 135–145)
Total Bilirubin: 2.2 mg/dL — ABNORMAL HIGH (ref 0.3–1.2)
Total Protein: 7.3 g/dL (ref 6.5–8.1)

## 2022-08-02 LAB — MAGNESIUM
Magnesium: 1.9 mg/dL (ref 1.7–2.4)
Magnesium: 1.5 mg/dL — ABNORMAL LOW (ref 1.7–2.4)

## 2022-08-02 LAB — LIPASE, BLOOD: Lipase: 28 U/L (ref 11–51)

## 2022-08-02 LAB — TSH: TSH: 1.185 u[IU]/mL (ref 0.350–4.500)

## 2022-08-02 LAB — TROPONIN I (HIGH SENSITIVITY)
Troponin I (High Sensitivity): 10 ng/L (ref <18)
Troponin I (High Sensitivity): 9 ng/L (ref <18)

## 2022-08-02 MED ORDER — ACETAMINOPHEN 500 MG PO TABS
1000.0000 mg | ORAL_TABLET | Freq: Once | ORAL | Status: AC
  Administered 2022-08-02: 1000 mg via ORAL
  Filled 2022-08-02: qty 2

## 2022-08-02 MED ORDER — SODIUM CHLORIDE 0.9 % IV SOLN: INTRAVENOUS | Status: DC

## 2022-08-02 MED ORDER — ACETAMINOPHEN 650 MG RE SUPP: 650.0000 mg | Freq: Four times a day (QID) | RECTAL | Status: DC | PRN

## 2022-08-02 MED ORDER — POTASSIUM CHLORIDE IN NACL 20-0.9 MEQ/L-% IV SOLN
INTRAVENOUS | Status: AC
  Filled 2022-08-02: qty 1000

## 2022-08-02 MED ORDER — METRONIDAZOLE 500 MG/100ML IV SOLN
500.0000 mg | Freq: Once | INTRAVENOUS | Status: AC
  Administered 2022-08-02: 500 mg via INTRAVENOUS
  Filled 2022-08-02: qty 100

## 2022-08-02 MED ORDER — SODIUM CHLORIDE 0.9 % IV SOLN
1.0000 g | Freq: Once | INTRAVENOUS | Status: AC
  Administered 2022-08-02: 1 g via INTRAVENOUS
  Filled 2022-08-02: qty 10

## 2022-08-02 MED ORDER — MORPHINE SULFATE (PF) 4 MG/ML IV SOLN
4.0000 mg | Freq: Once | INTRAVENOUS | Status: AC
  Administered 2022-08-02: 4 mg via INTRAVENOUS
  Filled 2022-08-02: qty 1

## 2022-08-02 MED ORDER — SODIUM CHLORIDE 0.9 % IV SOLN
2.0000 g | INTRAVENOUS | Status: DC
  Administered 2022-08-02 – 2022-08-04 (×3): 2 g via INTRAVENOUS
  Filled 2022-08-02 (×4): qty 20

## 2022-08-02 MED ORDER — INDOMETHACIN 50 MG RE SUPP
100.0000 mg | Freq: Once | RECTAL | Status: DC
  Filled 2022-08-02: qty 2

## 2022-08-02 MED ORDER — MORPHINE SULFATE (PF) 2 MG/ML IV SOLN
2.0000 mg | INTRAVENOUS | Status: DC | PRN
  Administered 2022-08-04 – 2022-08-05 (×3): 2 mg via INTRAVENOUS
  Filled 2022-08-02 (×3): qty 1

## 2022-08-02 MED ORDER — ENOXAPARIN SODIUM 40 MG/0.4ML IJ SOSY
40.0000 mg | PREFILLED_SYRINGE | INTRAMUSCULAR | Status: DC
  Administered 2022-08-02 – 2022-08-03 (×2): 40 mg via SUBCUTANEOUS
  Filled 2022-08-02 (×2): qty 0.4

## 2022-08-02 MED ORDER — SODIUM CHLORIDE 0.9 % IV BOLUS
1000.0000 mL | Freq: Once | INTRAVENOUS | Status: AC
  Administered 2022-08-02: 1000 mL via INTRAVENOUS

## 2022-08-02 MED ORDER — ALBUTEROL SULFATE (2.5 MG/3ML) 0.083% IN NEBU: 2.5000 mg | INHALATION_SOLUTION | Freq: Four times a day (QID) | RESPIRATORY_TRACT | Status: DC | PRN

## 2022-08-02 MED ORDER — POTASSIUM CHLORIDE IN NACL 20-0.9 MEQ/L-% IV SOLN
INTRAVENOUS | Status: DC
  Filled 2022-08-02: qty 1000

## 2022-08-02 MED ORDER — POTASSIUM CHLORIDE CRYS ER 20 MEQ PO TBCR
40.0000 meq | EXTENDED_RELEASE_TABLET | Freq: Once | ORAL | Status: AC
  Administered 2022-08-02: 40 meq via ORAL
  Filled 2022-08-02: qty 2

## 2022-08-02 MED ORDER — POTASSIUM CHLORIDE 10 MEQ/100ML IV SOLN
10.0000 meq | INTRAVENOUS | Status: AC
  Administered 2022-08-02 (×3): 10 meq via INTRAVENOUS
  Filled 2022-08-02 (×3): qty 100

## 2022-08-02 MED ORDER — CIPROFLOXACIN IN D5W 400 MG/200ML IV SOLN
400.0000 mg | Freq: Once | INTRAVENOUS | Status: DC
  Filled 2022-08-02: qty 200

## 2022-08-02 MED ORDER — ACETAMINOPHEN 325 MG PO TABS
650.0000 mg | ORAL_TABLET | Freq: Four times a day (QID) | ORAL | Status: DC | PRN
  Administered 2022-08-04: 650 mg via ORAL
  Filled 2022-08-02: qty 2

## 2022-08-02 MED ORDER — ONDANSETRON HCL 4 MG/2ML IJ SOLN: 4.0000 mg | Freq: Four times a day (QID) | INTRAMUSCULAR | Status: DC | PRN

## 2022-08-02 MED ORDER — ONDANSETRON HCL 4 MG PO TABS: 4.0000 mg | ORAL_TABLET | Freq: Four times a day (QID) | ORAL | Status: DC | PRN

## 2022-08-02 MED ORDER — SODIUM CHLORIDE 0.9% FLUSH
3.0000 mL | Freq: Two times a day (BID) | INTRAVENOUS | Status: DC
  Administered 2022-08-02 – 2022-08-05 (×3): 3 mL via INTRAVENOUS

## 2022-08-02 MED ORDER — METRONIDAZOLE 500 MG/100ML IV SOLN
500.0000 mg | Freq: Two times a day (BID) | INTRAVENOUS | Status: DC
  Administered 2022-08-02 – 2022-08-05 (×6): 500 mg via INTRAVENOUS
  Filled 2022-08-02 (×6): qty 100

## 2022-08-02 MED ORDER — ONDANSETRON HCL 4 MG/2ML IJ SOLN
4.0000 mg | Freq: Once | INTRAMUSCULAR | Status: AC
  Administered 2022-08-02: 4 mg via INTRAVENOUS
  Filled 2022-08-02: qty 2

## 2022-08-02 NOTE — ED Notes (Signed)
called to [facility] per MD Wong/change of plans/truck in route/rep:ruby.Marland KitchenMarland Kitchen

## 2022-08-02 NOTE — Consult Note (Signed)
     Consultation  Referring Provider:  [facility]  Primary Care Physician:  Bacigalupo, Angela M, MD Primary Gastroenterologist:  [facility]       Reason for Consultation:     Choledocholithiasis  LOS: 0 days          HPI:   Meredith Hamilton is a 72 y.o. female with past medical history significant for  past medical history significant for hypertension, anxiety, history of breast cancer, and thyroid disease presents for evaluation of choledocholithiasis  Patient originally presented to [facility] due to common bile duct stone.  Intermittent RUQ pain for 3 weeks, worse after meals. Reported severe epigastric pain that would wrap around to her back. States pain would come on after eating and last for about 3 days and then she could go a few days with no episodes of pain. Associated nausea, no vomiting.  However, episodes became progressively worse which led her to presenting to the emergency department.  RUQ ultrasound shows subcentimeter gallstone versus benign gallbladder polyp and distal common bile duct stone.  Common bile duct is 7.9 mm with a 5.8 mm shadowing distal stone acute cholecystitis. Denies weight loss, change in bowel habits.  Reports history of colonoscopy for screening done in [place] about 5 to 6 years ago, reportedly benign polyp.  Denies family history of colon cancer or GI issues.  Denies NSAID use, tobacco use, alcohol use.  Daughter and son-in law in room with patient  ED course: emergency department at [facility] patient was noted to be afebrile, pulse 53-98, respirations 13-25, blood pressure 134/58-167/63, and O2 saturations currently maintained on room air.  Labs significant for potassium 2.8 BUN 25, creatinine 0.93, glucose 154, lipase 28, AST 132, ALT 108, total bilirubin 2.2, and high-sensitivity troponins negative x 2. Patient was given potassium chloride 10 meq IV and 40 meq p.o., 1 L normal saline IV fluids, Rocephin, azithromycin, morphine, and Zofran   Past Medical History:   Diagnosis Date   Breast CA    Family history of breast cancer    Family history of lung cancer    Family history of throat cancer    Hashimoto's disease    Hypertension    Non-celiac gluten sensitivity     Surgical History:  She  has a past surgical history that includes Appendectomy (1979); Right oophorectomy (1979); Lumbar disc surgery (1974); Mastectomy w/ sentinel node biopsy (Left, 2019); and Ureteroscopy (Right, 2017). Family History:  Her family history includes Breast cancer (age of onset: 45) in her sister; Lung cancer (age of onset: 62) in her father and mother; Lung cancer (age of onset: 70) in her brother; Throat cancer in her brother. Social History:   reports that she has never smoked. She has never used smokeless tobacco. She reports that she does not currently use alcohol. She reports that she does not use drugs.  Prior to Admission medications   Medication Sig Start Date End Date Taking? Authorizing Provider  anastrozole (ARIMIDEX) 1 MG tablet TAKE 1 TABLET(1 MG) BY MOUTH DAILY Patient taking differently: Take 1 mg by mouth daily. 10/26/21  Yes Rao, Archana C, MD  BLACK COHOSH PO Take by mouth. 2 Tsp daily   Yes [provider]  Calcium-Magnesium (CAL-MAG PO) Take by mouth.   Yes [provider]  Cholecalciferol (VITAMIN D3) LIQD Take 6 drops by mouth daily.   Yes [provider]  hydrochlorothiazide (HYDRODIURIL) 12.5 MG tablet Take 1 tablet (12.5 mg total) by mouth daily. 03/31/22  Yes   Bacigalupo, Angela M, MD  OVER THE COUNTER MEDICATION Take 4 Scoops by mouth daily. Pecta Sol-C (Mod. Citrus Pectin)   Yes [provider]  OVER THE COUNTER MEDICATION Take 2 Scoops by mouth daily. Fermented Mushroom Blend   Yes [provider]  OVER THE COUNTER MEDICATION Take 6 capsules by mouth daily. Cruciferous Complete   Yes [provider]  OVER THE COUNTER MEDICATION Nitric Balance 4 tsp daily as needed   Yes [provider]  UNABLE TO FIND Take 10 drops by mouth daily. Med Name: Liquid Vitamin A   Yes [provider]  UNABLE TO FIND Med Name: Hydrex 2 vials daily   Yes [provider]  UNABLE TO FIND Med Name: Adrena Calm 1/4 pump daily   Yes [provider]  UNABLE TO FIND Med Name: BIOST skeletal and cellular Health 6 capsules daily   Yes [provider]  UNABLE TO FIND Med Name: A-C Carbamide 3 daily   Yes [provider]  UNABLE TO FIND Med Name: Derma Co 1 daily   Yes [provider]  naproxen (NAPROSYN) 500 MG tablet TAKE 1 TABLET(500 MG) BY MOUTH TWICE DAILY WITH A MEAL Patient not taking: Reported on 08/02/2022 06/01/22   Bacigalupo, Angela M, MD  triamcinolone ointment (KENALOG) 0.5 % Apply 1 application. topically 2 (two) times daily. Patient not taking: Reported on 08/02/2022 09/03/21   Bacigalupo, Angela M, MD    Current Facility-Administered Medications  Medication Dose Route Frequency Provider Last Rate Last Admin   0.9 % NaCl with KCl 20 mEq/ L  infusion   Intravenous Continuous Smith, Rondell A, MD       acetaminophen (TYLENOL) tablet 650 mg  650 mg Oral Q6H PRN Smith, Rondell A, MD       Or   acetaminophen (TYLENOL) suppository 650 mg  650 mg Rectal Q6H PRN Smith, Rondell A, MD       albuterol (PROVENTIL) (2.5 MG/3ML) 0.083% nebulizer solution 2.5 mg  2.5 mg Nebulization Q6H PRN Smith, Rondell A, MD       cefTRIAXone (ROCEPHIN) 2 g in sodium chloride 0.9 % 100 mL IVPB  2 g Intravenous Q24H Smith, Rondell A, MD       enoxaparin (LOVENOX) injection 40 mg  40 mg Subcutaneous Q24H Smith, Rondell A, MD       metroNIDAZOLE (FLAGYL) IVPB 500 mg  500 mg Intravenous Q12H Smith, Rondell A, MD       morphine (PF) 2 MG/ML injection 2 mg  2 mg Intravenous Q3H PRN Smith, Rondell A, MD       ondansetron (ZOFRAN) tablet 4 mg  4 mg Oral Q6H PRN Smith, Rondell A, MD       Or   ondansetron (ZOFRAN) injection 4 mg  4 mg Intravenous Q6H PRN Smith,  Rondell A, MD       sodium chloride flush (NS) 0.9 % injection 3 mL  3 mL Intravenous Q12H Smith, Rondell A, MD       Current Outpatient Medications  Medication Sig Dispense Refill   anastrozole (ARIMIDEX) 1 MG tablet TAKE 1 TABLET(1 MG) BY MOUTH DAILY (Patient taking differently: Take 1 mg by mouth daily.) 90 tablet 3   BLACK COHOSH PO Take by mouth. 2 Tsp daily     Calcium-Magnesium (CAL-MAG PO) Take by mouth.     Cholecalciferol (VITAMIN D3) LIQD Take 6 drops by mouth daily.     hydrochlorothiazide (HYDRODIURIL) 12.5 MG tablet Take 1 tablet (  12.5 mg total) by mouth daily. 90 tablet 3   OVER THE COUNTER MEDICATION Take 4 Scoops by mouth daily. Pecta Sol-C (Mod. Citrus Pectin)     OVER THE COUNTER MEDICATION Take 2 Scoops by mouth daily. Fermented Mushroom Blend     OVER THE COUNTER MEDICATION Take 6 capsules by mouth daily. Cruciferous Complete     OVER THE COUNTER MEDICATION Nitric Balance 4 tsp daily as needed     UNABLE TO FIND Take 10 drops by mouth daily. Med Name: Liquid Vitamin A     UNABLE TO FIND Med Name: Hydrex 2 vials daily     UNABLE TO FIND Med Name: Adrena Calm 1/4 pump daily     UNABLE TO FIND Med Name: BIOST skeletal and cellular Health 6 capsules daily     UNABLE TO FIND Med Name: A-C Carbamide 3 daily     UNABLE TO FIND Med Name: Derma Co 1 daily     naproxen (NAPROSYN) 500 MG tablet TAKE 1 TABLET(500 MG) BY MOUTH TWICE DAILY WITH A MEAL (Patient not taking: Reported on 08/02/2022) 180 tablet 1   triamcinolone ointment (KENALOG) 0.5 % Apply 1 application. topically 2 (two) times daily. (Patient not taking: Reported on 08/02/2022) 60 g 1    Allergies as of 08/02/2022 - Review Complete 08/02/2022  Allergen Reaction Noted   Other Rash 10/07/2021   Sulfa antibiotics Swelling 07/20/2017   Tramadol Other (See Comments) 07/20/2017    Review of Systems  Constitutional:  Negative for chills, fever and weight loss.  HENT:  Negative for hearing loss and tinnitus.   Eyes:   Negative for blurred vision and double vision.  Respiratory:  Negative for cough and hemoptysis.   Cardiovascular:  Negative for chest pain and palpitations.  Gastrointestinal:  Positive for abdominal pain and nausea. Negative for blood in stool, constipation, diarrhea, heartburn, melena and vomiting.  Genitourinary:  Negative for dysuria and urgency.  Musculoskeletal:  Negative for myalgias and neck pain.  Skin:  Negative for itching and rash.  Neurological:  Negative for seizures and loss of consciousness.  Psychiatric/Behavioral:  Negative for depression and suicidal ideas.        Physical Exam:  Vital signs in last 24 hours: Temp:  [97.9 F (36.6 C)-99.4 F (37.4 C)] 99.4 F (37.4 C) (04/23 0701) Pulse Rate:  [53-98] 63 (04/23 0800) Resp:  [13-25] 17 (04/23 0800) BP: (113-167)/(58-86) 113/86 (04/23 0800) SpO2:  [96 %-100 %] 99 % (04/23 0800) Weight:  [85 kg-85.7 kg] 85 kg (04/23 0656)   Last BM recorded by nurses in past 5 days No data recorded  Physical Exam Constitutional:      Appearance: She is well-developed.  HENT:     Head: Normocephalic and atraumatic.     Nose: Nose normal.     Mouth/Throat:     Mouth: Mucous membranes are moist.     Pharynx: Oropharynx is clear.  Eyes:     Extraocular Movements: Extraocular movements intact.     Conjunctiva/sclera: Conjunctivae normal.  Cardiovascular:     Rate and Rhythm: Normal rate and regular rhythm.  Pulmonary:     Effort: Pulmonary effort is normal.     Breath sounds: Normal breath sounds.  Abdominal:     General: Abdomen is flat. Bowel sounds are normal.     Palpations: Abdomen is soft.     Tenderness: There is abdominal tenderness (mild generalized).  Musculoskeletal:        General: No swelling. Normal range of   motion.     Cervical back: Normal range of motion. No rigidity.  Skin:    General: Skin is warm and dry.     Coloration: Skin is not jaundiced.  Neurological:     General: No focal deficit present.      Mental Status: She is alert and oriented to person, place, and time.  Psychiatric:        Mood and Affect: Mood normal.        Behavior: Behavior normal.        Thought Content: Thought content normal.        Judgment: Judgment normal.      LAB RESULTS: Recent Labs    08/02/22 0019  WBC 8.5  HGB 12.4  HCT 37.7  PLT 195   BMET Recent Labs    08/02/22 0019  NA 140  K 2.8*  CL 104  CO2 22  GLUCOSE 154*  BUN 25*  CREATININE 0.93  CALCIUM 9.1   LFT Recent Labs    08/02/22 0019  PROT 7.3  ALBUMIN 4.3  AST 132*  ALT 108*  ALKPHOS 117  BILITOT 2.2*   PT/INR No results for input(s): "LABPROT", "INR" in the last 72 hours.  STUDIES: US ABDOMEN LIMITED RUQ (LIVER/GB)  Result Date: 08/02/2022 CLINICAL DATA:  Intermittent abdominal pain. EXAM: ULTRASOUND ABDOMEN LIMITED RIGHT UPPER QUADRANT COMPARISON:  None Available. FINDINGS: Gallbladder: A 4.8 mm gallstone versus gallbladder polyp is seen within the dependent portion of the gallbladder lumen. The gallbladder wall measures 3.0 mm in thickness. Pericholecystic fluid is seen. A positive sonographic Murphy sign is noted by sonographer. Common bile duct: Diameter: 7.9 mm with a 5.8 mm shadowing distal common bile duct stone. Liver: No focal lesion identified. Within normal limits in parenchymal echogenicity. Portal vein is patent on color Doppler imaging with normal direction of blood flow towards the liver. Other: A 12 mm renal calculus is seen within the lower pole of the right kidney. IMPRESSION: Subcentimeter gallstone versus benign gallbladder polyp and distal common bile duct stone, in the setting of a positive sonographic Murphy sign, suggestive of acute cholecystitis. Electronically Signed   By: Thaddeus  Houston M.D.   On: 08/02/2022 02:42      Impression    Choledocholithiasis with acute cholecystitis - RUQ ultrasound shows subcentimeter gallstone versus benign gallbladder polyp and distal common bile duct stone.   Common bile duct is 7.9 mm with a 5.8 mm shadowing distal stone acute cholecystitis - AST 132/ALT 108/alk phos 117 - T bili 2.2  History of breast cancer Medication regimen and includes Arimidex  Hypokalemia - K 2.8   Plan   - Daily CBC, CMET -Continue supportive care -Continue pain control. -Plan for ERCP, unsure timing. If not today, will give diet. -I thoroughly discussed procedure with the patient to include nature, alternatives, benefits, and risks (including but not limited to post ERCP pancreatitis, bleeding, infection, perforation, anesthesia/cardiac pulmonary complications). Discussed risk of pancreatitis associated with ERCP. Patient verbalized understanding and gave verbal consent to proceed with ERCP. - replenish potassium  -Patient will benefit from surgical consult this admission   Thank you for your kind consultation, we will continue to follow.   Teairra Millar M Porcha Deblanc  08/02/2022, 8:47 AM  

## 2022-08-02 NOTE — Consult Note (Signed)
Meredith Hamilton 1950/09/15  [id number].    Requesting MD: Orvan Falconer, MD Chief Complaint/Reason for Consult: choledocholithiasis   HPI:  Meredith Hamilton is a 72 y/o F with a history of hashimoto's disease, breast CA s/p left mastectomy 2019 in [place], and HTN who presents with cc abdominal pain. She reports intermittent RUQ and epigastric pain that started early this month. The pain is exacerbated by PO intake. It occasionally radiates to her back and is associated with nausea and vomiting. Her most recent episode started last night and was severe, associated with chills, so she presented to [facility] for further evaluation.   Past abdominal surgeries: was taken to OR in 1979 for open appendectomy but actually had ovarian torsion. States she underwent right oophorectomy and appendectomy at that time. Blood thinners: none Substance/social: denies tobacco, etoh, or drug use. Lives with her kids in [place].   ROS: Review of Systems  Gastrointestinal:  Positive for abdominal pain, nausea and vomiting.  All other systems reviewed and are negative.   Family History  Problem Relation Age of Onset   Lung cancer Mother 81       hx smoking   Lung cancer Father 39       hx smoking   Breast cancer Sister 35       in remission, now in mid 76s   Lung cancer Brother 45       smoker   Throat cancer Brother        alcohol   Colon cancer Neg Hx    Cervical cancer Neg Hx    Ovarian cancer Neg Hx     Past Medical History:  Diagnosis Date   Breast CA    Family history of breast cancer    Family history of lung cancer    Family history of throat cancer    Hashimoto's disease    Hypertension    Non-celiac gluten sensitivity     Past Surgical History:  Procedure Laterality Date   APPENDECTOMY  1979   LUMBAR DISC SURGERY  1974   MASTECTOMY W/ SENTINEL NODE BIOPSY Left 2019   RIGHT OOPHORECTOMY  1979   due to ovarian torsion   URETEROSCOPY Right 2017    Social History:  reports that she has never smoked.  She has never used smokeless tobacco. She reports that she does not currently use alcohol. She reports that she does not use drugs.  Allergies:  Allergies  Allergen Reactions   Other Rash   Sulfa Antibiotics Swelling   Tramadol Other (See Comments)    Other reaction(s): Hallucination    (Not in a hospital admission)    Physical Exam: Blood pressure (!) 129/59, pulse (!) 58, temperature 99.4 F (37.4 C), temperature source Oral, resp. rate 19, height 5\' 6"  (1.676 m), weight 85 kg, SpO2 98 %. General: Pleasant female laying on hospital bed, appears stated age, NAD. HEENT: head -normocephalic, atraumatic; Eyes: pupils equal and round, anicteric sclerae Neck- Trachea is midline CV- RRR Pulm- breathing is non-labored on room air. CTABL, no wheezes, rhales, rhonchi. Abd- soft, TTP RUQ and epigstric region without guarding, no rebound tenderness, no hernias or masses, there is a previous low transverse incision that starts in the RLQ and crosses midline. Appears well healing.  GU- deferred  Psych- Alert and Oriented x3 with appropriate affect Skin: warm and dry, no rashes or lesions   Results for orders placed or performed during the hospital encounter of 08/02/22 (from the past 48 hour(s))  CBC  with Differential     Status: None   Collection Time: 08/02/22 12:19 AM  Result Value Ref Range   WBC 8.5 4.0 - 10.5 K/uL   RBC 3.94 3.87 - 5.11 MIL/uL   Hemoglobin 12.4 12.0 - 15.0 g/dL   HCT 82.9 56.2 - 13.0 %   MCV 95.7 80.0 - 100.0 fL   MCH 31.5 26.0 - 34.0 pg   MCHC 32.9 30.0 - 36.0 g/dL   RDW 86.5 78.4 - 69.6 %   Platelets 195 150 - 400 K/uL   nRBC 0.0 0.0 - 0.2 %   Neutrophils Relative % 70 %   Neutro Abs 5.9 1.7 - 7.7 K/uL   Lymphocytes Relative 22 %   Lymphs Abs 1.9 0.7 - 4.0 K/uL   Monocytes Relative 6 %   Monocytes Absolute 0.5 0.1 - 1.0 K/uL   Eosinophils Relative 1 %   Eosinophils Absolute 0.1 0.0 - 0.5 K/uL   Basophils Relative 1 %   Basophils Absolute 0.0 0.0 - 0.1  K/uL   Immature Granulocytes 0 %   Abs Immature Granulocytes 0.03 0.00 - 0.07 K/uL    Comment: Performed at [facility], [address] [place], [place] [address]  Comprehensive metabolic panel     Status: Abnormal   Collection Time: 08/02/22 12:19 AM  Result Value Ref Range   Sodium 140 135 - 145 mmol/L   Potassium 2.8 (L) 3.5 - 5.1 mmol/L   Chloride 104 98 - 111 mmol/L   CO2 22 22 - 32 mmol/L   Glucose, Bld 154 (H) 70 - 99 mg/dL    Comment: Glucose reference range applies only to samples taken after fasting for at least 8 hours.   BUN 25 (H) 8 - 23 mg/dL   Creatinine, Ser 4.13 0.44 - 1.00 mg/dL   Calcium 9.1 8.9 - 24.4 mg/dL   Total Protein 7.3 6.5 - 8.1 g/dL   Albumin 4.3 3.5 - 5.0 g/dL   AST 010 (H) 15 - 41 U/L   ALT 108 (H) 0 - 44 U/L   Alkaline Phosphatase 117 38 - 126 U/L   Total Bilirubin 2.2 (H) 0.3 - 1.2 mg/dL   GFR, Estimated >27 >25 mL/min    Comment: (NOTE) Calculated using the CKD-EPI Creatinine Equation (2021)    Anion gap 14 5 - 15    Comment: Performed at [facility], [address] [place], [place] [address]  Lipase, blood     Status: None   Collection Time: 08/02/22 12:19 AM  Result Value Ref Range   Lipase 28 11 - 51 U/L    Comment: Performed at [facility], [address] [place], [place] [address]  Troponin I (High Sensitivity)     Status: None   Collection Time: 08/02/22 12:19 AM  Result Value Ref Range   Troponin I (High Sensitivity) 9 <18 ng/L    Comment: (NOTE) Elevated high sensitivity troponin I (hsTnI) values and significant  changes across serial measurements may suggest ACS but many other  chronic and acute conditions are known to elevate hsTnI results.  Refer to the "Links" section for chest pain algorithms and additional  guidance. Performed at [facility], [address] [place], [place] [address]   Magnesium     Status: None   Collection Time: 08/02/22 12:19 AM  Result Value Ref Range    Magnesium 1.9 1.7 - 2.4 mg/dL    Comment: Performed at [facility], [address] [place],  [place] [address]  Troponin I (High Sensitivity)     Status: None   Collection Time: 08/02/22  3:51 AM  Result Value Ref Range   Troponin I (High Sensitivity) 10 <18 ng/L    Comment: (NOTE) Elevated high sensitivity troponin I (hsTnI) values and significant  changes across serial measurements may suggest ACS but many other  chronic and acute conditions are known to elevate hsTnI results.  Refer to the "Links" section for chest pain algorithms and additional  guidance. Performed at [facility], [address] [place], [place] [address]    US ABDOMEN LIMITED RUQ (LIVER/GB)  Result Date: 08/02/2022 CLINICAL DATA:  Intermittent abdominal pain. EXAM: ULTRASOUND ABDOMEN LIMITED RIGHT UPPER QUADRANT COMPARISON:  None Available. FINDINGS: Gallbladder: A 4.8 mm gallstone versus gallbladder polyp is seen within the dependent portion of the gallbladder lumen. The gallbladder wall measures 3.0 mm in thickness. Pericholecystic fluid is seen. A positive sonographic Eulah Pont sign is noted by sonographer. Common bile duct: Diameter: 7.9 mm with a 5.8 mm shadowing distal common bile duct stone. Liver: No focal lesion identified. Within normal limits in parenchymal echogenicity. Portal vein is patent on color Doppler imaging with normal direction of blood flow towards the liver. Other: A 12 mm renal calculus is seen within the lower pole of the right kidney. IMPRESSION: Subcentimeter gallstone versus benign gallbladder polyp and distal common bile duct stone, in the setting of a positive sonographic Murphy sign, suggestive of acute cholecystitis. Electronically Signed   By: Aram Candela M.D.   On: 08/02/2022 02:42      Assessment/Plan Choledocholithiasis  - afebrile, HR 53-58 bpm, normotensive, WNC 8.5 - AST 132, ALT 108, bili 2.2 - RUQ U/S shows cholelithiasis as well as a stone in the  distal common bile duct, GI is planning ERCP tomorrow at 1300.  - agree with Rocephin for possible cholecystitis given positive sonographic Murphy sign  - will plan for lap chole this admission  FEN - CLD, NPO MN VTE - SCDs's, lovenox ID - Rocephin Admit - TRH service  I reviewed Consultant gastroenterology notes, last 24 h vitals and pain scores, last 48 h intake and output, last 24 h labs and trends, and last 24 h imaging results.  Carlena Bjornstad, PA-C [facility] 08/02/2022, 1:51 PM Please see Amion for pager number during day hours 7:00am-4:30pm or 7:00am -11:30am on weekends

## 2022-08-02 NOTE — ED Triage Notes (Signed)
To triage via wheelchair with c/o abd pain, back pain, nausea, and vomiting.  Sx began several weeks ago and has lost apx 13 lbs since 04/01.  Reports pain concentrated in umbilical region but does radiate across entire abd. Pain worsens with eating. Describes pain as " gripping, feels like knots." Episodes of sx are intermittent, last varying lengths of time.

## 2022-08-02 NOTE — ED Notes (Signed)
EMTALA reviewed by Charge RN at this time.  All documents completed and pt is ready for transport.

## 2022-08-02 NOTE — ED Notes (Signed)
ED TO INPATIENT HANDOFF REPORT  ED Nurse Name and Phone #: [phone]  S Name/Age/Gender Meredith Hamilton 72 y.o. female Room/Bed: 027C/027C  Code Status   Code Status: Full Code  Home/SNF/Other Home Patient oriented to: self, place, time, and situation Is this baseline? Yes   Triage Complete: Triage complete  Chief Complaint Choledocholithiasis with cholecystitis [K80.40]  Triage Note Pt tx from [place] RUQ pain onset of 3 weeks ago. Transferred here for ERCP. Pt is hypokalemic and has elevated LFT's. Arrives A+O, VSS, NADN.   Allergies Allergies  Allergen Reactions   Other Rash   Sulfa Antibiotics Swelling   Tramadol Other (See Comments)    Other reaction(s): Hallucination    Level of Care/Admitting Diagnosis ED Disposition     ED Disposition  Admit   Condition  --   Comment  Hospital Area: [facility] [[id number]]  Level of Care: Telemetry Medical [104]  May admit patient to [facility] or [facility] if equivalent level of care is available:: No  Covid Evaluation: Asymptomatic - no recent exposure (last 10 days) testing not required  Diagnosis: Choledocholithiasis with cholecystitis [9604540]  Admitting Physician: Clydie Braun [[id number]]  Attending Physician: Clydie Braun [[id number]]  Certification:: I certify this patient will need inpatient services for at least 2 midnights  Estimated Length of Stay: 2          B Medical/Surgery History Past Medical History:  Diagnosis Date   Breast CA    Family history of breast cancer    Family history of lung cancer    Family history of throat cancer    Hashimoto's disease    Hypertension    Non-celiac gluten sensitivity    Past Surgical History:  Procedure Laterality Date   APPENDECTOMY  1979   LUMBAR DISC SURGERY  1974   MASTECTOMY W/ SENTINEL NODE BIOPSY Left 2019   RIGHT OOPHORECTOMY  1979   due to ovarian torsion   URETEROSCOPY Right 2017     A IV Location/Drains/Wounds Patient  Lines/Drains/Airways Status     Active Line/Drains/Airways     Name Placement date Placement time Site Days   Peripheral IV 08/02/22 20 G Right Antecubital 08/02/22  0141  Antecubital  less than 1   Peripheral IV 08/02/22 22 G Left;Posterior Hand 08/02/22  0401  Hand  less than 1            Intake/Output Last 24 hours No intake or output data in the 24 hours ending 08/02/22 1653  Labs/Imaging Results for orders placed or performed during the hospital encounter of 08/02/22 (from the past 48 hour(s))  Magnesium     Status: Abnormal   Collection Time: 08/02/22 12:51 PM  Result Value Ref Range   Magnesium 1.5 (L) 1.7 - 2.4 mg/dL    Comment: Performed at [facility], [address], [place], [place] [address]  TSH     Status: None   Collection Time: 08/02/22 12:51 PM  Result Value Ref Range   TSH 1.185 0.350 - 4.500 uIU/mL    Comment: Performed by a 3rd Generation assay with a functional sensitivity of <=0.01 uIU/mL. Performed at [facility], [address], [place], [place] [address]    US ABDOMEN LIMITED RUQ (LIVER/GB)  Result Date: 08/02/2022 CLINICAL DATA:  Intermittent abdominal pain. EXAM: ULTRASOUND ABDOMEN LIMITED RIGHT UPPER QUADRANT COMPARISON:  None Available. FINDINGS: Gallbladder: A 4.8 mm gallstone versus gallbladder polyp is seen within the dependent portion of the gallbladder  lumen. The gallbladder wall measures 3.0 mm in thickness. Pericholecystic fluid is seen. A positive sonographic Eulah Pont sign is noted by sonographer. Common bile duct: Diameter: 7.9 mm with a 5.8 mm shadowing distal common bile duct stone. Liver: No focal lesion identified. Within normal limits in parenchymal echogenicity. Portal vein is patent on color Doppler imaging with normal direction of blood flow towards the liver. Other: A 12 mm renal calculus is seen within the lower pole of the right kidney. IMPRESSION: Subcentimeter gallstone versus benign gallbladder polyp and distal common  bile duct stone, in the setting of a positive sonographic Murphy sign, suggestive of acute cholecystitis. Electronically Signed   By: Aram Candela M.D.   On: 08/02/2022 02:42    Pending Labs Unresulted Labs (From admission, onward)     Start     Ordered   08/03/22 0500  CBC  Tomorrow morning,   R        08/02/22 0822   08/03/22 0500  Comprehensive metabolic panel  Tomorrow morning,   R        08/02/22 0822            Vitals/Pain Today's Vitals   08/02/22 1300 08/02/22 1400 08/02/22 1500 08/02/22 1600  BP: (!) 129/59 134/64 (!) 148/69 (!) 116/49  Pulse: (!) 58 (!) 58 (!) 59 (!) 55  Resp: 19 20 (!) 21 20  Temp:      TempSrc:      SpO2: 98% 97% 96% 100%  Weight:      Height:      PainSc:        Isolation Precautions No active isolations  Medications Medications  enoxaparin (LOVENOX) injection 40 mg (has no administration in time range)  sodium chloride flush (NS) 0.9 % injection 3 mL (3 mLs Intravenous Not Given 08/02/22 0839)  morphine (PF) 2 MG/ML injection 2 mg (has no administration in time range)  acetaminophen (TYLENOL) tablet 650 mg (has no administration in time range)    Or  acetaminophen (TYLENOL) suppository 650 mg (has no administration in time range)  ondansetron (ZOFRAN) tablet 4 mg (has no administration in time range)    Or  ondansetron (ZOFRAN) injection 4 mg (has no administration in time range)  albuterol (PROVENTIL) (2.5 MG/3ML) 0.083% nebulizer solution 2.5 mg (has no administration in time range)  cefTRIAXone (ROCEPHIN) 2 g in sodium chloride 0.9 % 100 mL IVPB (has no administration in time range)  metroNIDAZOLE (FLAGYL) IVPB 500 mg (has no administration in time range)  0.9 % NaCl with KCl 20 mEq/ L  infusion ( Intravenous New Bag/Given 08/02/22 1023)    Mobility walks     Focused Assessments Cardiac Assessment Handoff:    No results found for: "CKTOTAL", "CKMB", "CKMBINDEX", "TROPONINI" No results found for: "DDIMER" Does the  Patient currently have chest pain? No    R Recommendations: See Admitting Provider Note  Report given to:   Additional Notes:

## 2022-08-02 NOTE — ED Notes (Signed)
called to [facility] spoke with tammy/patient accepted ED to ED after shift change.Marland KitchenMarland Kitchen

## 2022-08-02 NOTE — H&P (Addendum)
History and Physical    Patient: Meredith Hamilton WUJ:[id number] DOB: 09/02/1950 DOA: 08/02/2022 DOS: the patient was seen and examined on 08/02/2022 PCP: Erasmo Downer, MD  Patient coming from: Transfer from [facility]  Chief Complaint:  Chief Complaint  Patient presents with   Abdominal Pain   HPI: Meredith Hamilton is a 72 y.o. female with medical history significant of hypertension, Hashimoto's disease, and history of breast cancer who presents with complaints of right upper quadrant abdominal pain over the last 3 weeks.  Pain is described as crampy in nature and normally occurred after eating meals for which she has had decreased p.o. intake.  No pain or intermittently radiate to her back.  Noted associated symptoms of nausea, but thin, and chills.  Denied having any shortness of breath, chest pain, dysuria, or change in stools.  She does not drink alcohol or smoke cigarettes.  Symptoms became so severe yesterday evening that she went to [facility] for further evaluation.   In the emergency department at [facility] patient was noted to be afebrile, pulse 53-98, respirations 13-25, blood pressure 134/58-167/63, and O2 saturations currently maintained on room air.  Labs significant for potassium 2.8 BUN 25, creatinine 0.93, glucose 154, lipase 28, AST 132, ALT 108, total bilirubin 2.2, and high-sensitivity troponins negative x 2.  Right upper quadrant ultrasound noted a subcentimeter gallstone versus benign gallbladder polyp and distal common bile duct stone in the setting of positive sonographic Murphy sign suggestive of acute cholecystitis.  Patient was given potassium chloride 10 meq IV and 40 meq p.o., 1 L normal saline IV fluids, Rocephin, azithromycin, morphine, and Zofran.  ERCP was not available at their facility and therefore [facility] have been consulted here at [facility].  Patient was transferred ED to ED due to wait time on beds.  Review of Systems: As mentioned in the history of  present illness. All other systems reviewed and are negative. Past Medical History:  Diagnosis Date   Breast CA    Family history of breast cancer    Family history of lung cancer    Family history of throat cancer    Hashimoto's disease    Hypertension    Non-celiac gluten sensitivity    Past Surgical History:  Procedure Laterality Date   APPENDECTOMY  1979   LUMBAR DISC SURGERY  1974   MASTECTOMY W/ SENTINEL NODE BIOPSY Left 2019   RIGHT OOPHORECTOMY  1979   due to ovarian torsion   URETEROSCOPY Right 2017   Social History:  reports that she has never smoked. She has never used smokeless tobacco. She reports that she does not currently use alcohol. She reports that she does not use drugs.  Allergies  Allergen Reactions   Other Rash   Sulfa Antibiotics Swelling   Tramadol Other (See Comments)    Other reaction(s): Hallucination    Family History  Problem Relation Age of Onset   Lung cancer Mother 9       hx smoking   Lung cancer Father 54       hx smoking   Breast cancer Sister 37       in remission, now in mid 79s   Lung cancer Brother 88       smoker   Throat cancer Brother        alcohol   Colon cancer Neg Hx    Cervical cancer Neg Hx    Ovarian cancer Neg Hx     Prior to Admission medications  Medication Sig Start Date End Date Taking? Authorizing Provider  amLODipine (NORVASC) 10 MG tablet Take 10 mg by mouth daily. 05/02/22   [provider]  anastrozole (ARIMIDEX) 1 MG tablet TAKE 1 TABLET(1 MG) BY MOUTH DAILY 10/26/21   Creig Hines, MD  BLACK COHOSH PO Take by mouth. 2 Tsp daily    [provider]  Calcium-Magnesium (CAL-MAG PO) Take by mouth.    [provider]  Cholecalciferol (VITAMIN D3) LIQD Take 6 drops by mouth daily.    [provider]  hydrochlorothiazide (HYDRODIURIL) 12.5 MG tablet Take 1 tablet (12.5 mg total) by mouth daily. 03/31/22   Erasmo Downer, MD  naproxen (NAPROSYN) 500 MG tablet TAKE 1  TABLET(500 MG) BY MOUTH TWICE DAILY WITH A MEAL 06/01/22   Bacigalupo, Marzella Schlein, MD  OVER THE COUNTER MEDICATION Take 4 Scoops by mouth daily. Pecta Sol-C (Mod. Citrus Pectin)    [provider]  OVER THE COUNTER MEDICATION Take 2 Scoops by mouth daily. Fermented Mushroom Blend    [provider]  OVER THE COUNTER MEDICATION Take 6 capsules by mouth daily. Cruciferous Complete    [provider]  OVER THE COUNTER MEDICATION Nitric Balance 4 tsp daily    [provider]  triamcinolone ointment (KENALOG) 0.5 % Apply 1 application. topically 2 (two) times daily. 09/03/21   Erasmo Downer, MD  UNABLE TO FIND Take 10 drops by mouth daily. Med Name: Liquid Vitamin A    [provider]  UNABLE TO FIND Med Name: Hydrex 2 vials daily    [provider]  UNABLE TO FIND Med Name: Adrena Calm 1/4 pump daily    [provider]  UNABLE TO FIND Med Name: BIOST skeletal and cellular Health 6 capsules daily    [provider]  UNABLE TO FIND Med Name: A-C Carbamide 3 daily    [provider]  UNABLE TO FIND Med Name: Derma Co 1 daily    [provider]    Physical Exam: Vitals:   08/02/22 0656 08/02/22 0701 08/02/22 0715  BP:  (!) 143/70   Pulse:  71 63  Resp:  19 (!) 25  Temp:  99.4 F (37.4 C)   TempSrc:  Oral   SpO2:  100% 99%  Weight: 85 kg    Height:  (1.676 m)      Constitutional: Elderly female who appears in no acute distress Eyes: PERRL, lids and conjunctivae normal ENMT: Mucous membranes are moist. Posterior pharynx clear of any exudate or lesions.Normal dentition.  Neck: normal, supple, no masses, no thyromegaly Respiratory: clear to auscultation bilaterally, no wheezing, no crackles. Normal respiratory effort. No accessory muscle use.  Cardiovascular: Regular rate and rhythm, no murmurs / rubs / gallops. No extremity edema.   Abdomen: Tenderness palpation of the right upper quadrant.  Bowel  sounds positive.  Musculoskeletal: no clubbing / cyanosis. No joint deformity upper and lower extremities. Good ROM, no contractures. Normal muscle tone.  Skin: no rashes, lesions, ulcers. No induration Neurologic: CN 2-12 grossly intact. Sensation intact, DTR normal. Strength 5/5 in all 4.  Psychiatric: Normal judgment and insight. Alert and oriented x 3. Normal mood.   Data Reviewed:  EKG reveals sinus bradycardia with blocked premature atrial complexes at 45 bpm with QTc 460.  Assessment and Plan: Choledocholithiasis with acute cholecystitis Elevated liver enzymes Acute.  Patient presented with complaints of abdominal pain.  Labs have been significant for AST 132, ALT 108, and total  bilirubin 2.2.  Right upper quadrant ultrasound was obtained which noted gallstones versus polyp, distal common bile duct stone measuring 5.8 mm with dilation of the common bile duct to 7.9 mm, pericholecystic fluid present, and positive sonographic Murphy sign concerning for acute cholecystitis.  Case had been discussed with [facility] and patient was transferred from [facility] due to need of ERCP. -Admit to a telemetry bed -N.p.o. after midnight for ERCP on 4/24 -Continue empiric antibiotics of metronidazole and Rocephin -Morphine IV as needed for pain -Normal saline IV fluids with 20 mEq of potassium chloride at 100 mL/h to complete 1 L -Appreciate [facility] and general surgery consultative services and will follow-up for any further recommendations  Asymptomatic bradycardia Heart rates were noted to be as low as 45 bpm with blood pressures maintained.  Unclear if this is related with electrolyte abnormalities -Correct electrolyte abnormalities -Continue to monitor telemetry  Hypokalemia Acute.  Potassium was initially noted to be as low as 2.8.  One factor is that patient is on hydrochlorothiazide.  She had been given potassium chloride 10 mEq p.o. and 40 mEq IV prior to transfer.  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  Medication regimen ncludes Arimidex -Continue Arimidex once able  DVT prophylaxis: Lovenox Advance Care Planning:   Code Status: Full Code    Consults: [facility], general surgery   Severity of Illness: The appropriate patient status for this patient is INPATIENT. Inpatient status is judged to be reasonable and necessary in order to provide the required intensity of service to ensure the patient's safety. The patient's presenting symptoms, physical exam findings, and initial radiographic and laboratory data in the context of their chronic comorbidities is felt to place them at high risk for further clinical deterioration. Furthermore, it is not anticipated that the patient will be medically stable for discharge from the hospital within 2 midnights of admission.   * I certify that at the point of admission it is my clinical judgment that the patient will require inpatient hospital care spanning beyond 2 midnights from the point of admission due to high intensity of service, high risk for further deterioration and high frequency of surveillance required.*  Author: Clydie Braun, MD 08/02/2022 8:07 AM  For on call review [email or web address].

## 2022-08-02 NOTE — ED Notes (Signed)
ED Provider at bedside. 

## 2022-08-02 NOTE — ED Provider Notes (Signed)
[place] EMERGENCY DEPARTMENT AT [facility] Provider Note   CSN: [id number] Arrival date & time: 08/02/22  7829     History  Chief Complaint  Patient presents with   Abdominal Pain    Meredith Hamilton is a 72 y.o. female with a past medical history significant for hypertension, anxiety, history of breast cancer, and thyroid disease who presents to the ED as a transfer from [facility] due to common bile duct stone for admission and ERCP.  Patient admits to intermittent right upper quadrant abdominal pain x 3 weeks worse after meals.  Admits to nausea, however no vomiting.  Denies chest pain and shortness of breath.  History of appendectomy.  No fever or chills.  History obtained from patient and past medical records. No interpreter used during encounter.       Home Medications Prior to Admission medications   Medication Sig Start Date End Date Taking? Authorizing Provider  amLODipine (NORVASC) 10 MG tablet Take 10 mg by mouth daily. 05/02/22   [provider]  anastrozole (ARIMIDEX) 1 MG tablet TAKE 1 TABLET(1 MG) BY MOUTH DAILY Patient taking differently: Take 1 mg by mouth daily. 10/26/21   Creig Hines, MD  BLACK COHOSH PO Take by mouth. 2 Tsp daily    [provider]  Calcium-Magnesium (CAL-MAG PO) Take by mouth.    [provider]  Cholecalciferol (VITAMIN D3) LIQD Take 6 drops by mouth daily.    [provider]  hydrochlorothiazide (HYDRODIURIL) 12.5 MG tablet Take 1 tablet (12.5 mg total) by mouth daily. 03/31/22   Bacigalupo, Marzella Schlein, MD  naproxen (NAPROSYN) 500 MG tablet TAKE 1 TABLET(500 MG) BY MOUTH TWICE DAILY WITH A MEAL Patient taking differently: Take 500 mg by mouth 2 (two) times daily with a meal. 06/01/22   Bacigalupo, Marzella Schlein, MD  OVER THE COUNTER MEDICATION Take 4 Scoops by mouth daily. Pecta Sol-C (Mod. Citrus Pectin)    [provider]  OVER THE COUNTER MEDICATION Take 2 Scoops by mouth daily. Fermented Mushroom  Blend    [provider]  OVER THE COUNTER MEDICATION Take 6 capsules by mouth daily. Cruciferous Complete    [provider]  OVER THE COUNTER MEDICATION Nitric Balance 4 tsp daily    [provider]  triamcinolone ointment (KENALOG) 0.5 % Apply 1 application. topically 2 (two) times daily. 09/03/21   Erasmo Downer, MD  UNABLE TO FIND Take 10 drops by mouth daily. Med Name: Liquid Vitamin A    [provider]  UNABLE TO FIND Med Name: Hydrex 2 vials daily    [provider]  UNABLE TO FIND Med Name: Adrena Calm 1/4 pump daily    [provider]  UNABLE TO FIND Med Name: BIOST skeletal and cellular Health 6 capsules daily    [provider]  UNABLE TO FIND Med Name: A-C Carbamide 3 daily    [provider]  UNABLE TO FIND Med Name: Derma Co 1 daily    [provider]      Allergies    Other, Sulfa antibiotics, and Tramadol    Review of Systems   Review of Systems  Constitutional:  Negative for chills and fever.  Respiratory:  Negative for shortness of breath.   Cardiovascular:  Negative for chest pain.  Gastrointestinal:  Positive for abdominal pain and nausea. Negative for diarrhea and vomiting.    Physical Exam Updated Vital Signs BP 113/86 (BP Location: Right Arm)   Pulse 63  Temp 99.4 F (37.4 C) (Oral)   Resp 17   Ht  (1.676 m)   Wt 85 kg   SpO2 99%   BMI 30.25 kg/m  Physical Exam Vitals and nursing note reviewed.  Constitutional:      General: She is not in acute distress.    Appearance: She is not ill-appearing.  HENT:     Head: Normocephalic.  Eyes:     Pupils: Pupils are equal, round, and reactive to light.  Cardiovascular:     Rate and Rhythm: Normal rate and regular rhythm.     Pulses: Normal pulses.     Heart sounds: Normal heart sounds. No murmur heard.    No friction rub. No gallop.  Pulmonary:     Effort: Pulmonary effort is normal.     Breath sounds: Normal  breath sounds.  Abdominal:     General: Abdomen is flat. There is no distension.     Palpations: Abdomen is soft.     Tenderness: There is abdominal tenderness. There is no guarding or rebound.  Musculoskeletal:        General: Normal range of motion.     Cervical back: Neck supple.  Skin:    General: Skin is warm and dry.  Neurological:     General: No focal deficit present.     Mental Status: She is alert.  Psychiatric:        Mood and Affect: Mood normal.        Behavior: Behavior normal.     ED Results / Procedures / Treatments   Labs (all labs ordered are listed, but only abnormal results are displayed) Labs Reviewed - No data to display  EKG None  Radiology US ABDOMEN LIMITED RUQ (LIVER/GB)  Result Date: 08/02/2022 CLINICAL DATA:  Intermittent abdominal pain. EXAM: ULTRASOUND ABDOMEN LIMITED RIGHT UPPER QUADRANT COMPARISON:  None Available. FINDINGS: Gallbladder: A 4.8 mm gallstone versus gallbladder polyp is seen within the dependent portion of the gallbladder lumen. The gallbladder wall measures 3.0 mm in thickness. Pericholecystic fluid is seen. A positive sonographic Eulah Pont sign is noted by sonographer. Common bile duct: Diameter: 7.9 mm with a 5.8 mm shadowing distal common bile duct stone. Liver: No focal lesion identified. Within normal limits in parenchymal echogenicity. Portal vein is patent on color Doppler imaging with normal direction of blood flow towards the liver. Other: A 12 mm renal calculus is seen within the lower pole of the right kidney. IMPRESSION: Subcentimeter gallstone versus benign gallbladder polyp and distal common bile duct stone, in the setting of a positive sonographic Murphy sign, suggestive of acute cholecystitis. Electronically Signed   By: Aram Candela M.D.   On: 08/02/2022 02:42    Procedures Procedures    Medications Ordered in ED Medications - No data to display  ED Course/ Medical Decision Making/ A&P                              Medical Decision Making Amount and/or Complexity of Data Reviewed Independent Historian: friend    Details: Daughter at bedside External Data Reviewed: notes.    Details: PCP note Labs:  Decision-making details documented in ED Course. Radiology: independent interpretation performed. Decision-making details documented in ED Course. ECG/medicine tests: independent interpretation performed. Decision-making details documented in ED Course.  Risk Decision regarding hospitalization.   This patient presents to the ED for concern of RUQ tenderness, this involves an extensive number of treatment options,  and is a complaint that carries with it a high risk of complications and morbidity.  The differential diagnosis includes acute cholecystitis, ACS, PE, MSK, PNA, etc  72 year old female presents to the ED as transfer from [facility] due to common bile duct stone.  Previous provider spoke to GI who recommended ERCP and admission to medicine.  Patient admits to intermittent right upper quadrant pain x 3 weeks worse after meals.  History of appendectomy.  Gallbladder intact.  Upon arrival, patient afebrile, not tachycardic or hypoxic.  Patient in no acute distress.  Physical exam significant for mild right upper quadrant tenderness.  Reviewed labs from [facility]  CBC with no leukocytosis.  Normal hemoglobin.  CMP significant for transaminitis with AST at 132, ALT at 108.  Elevated total bilirubin at 2.2.  Hypokalemia at 2.8.  Potassium repleted at [facility].  Magnesium normal.  Lipase normal.  Low suspicion for pancreatitis. Troponin normal, low suspicion for atypical ACS. [place] personally reviewed and interpreted which demonstrates distal common bile duct stone. IVFs, antibiotics, and pain medication given prior to transfer.  7:24 AM reassessed patient at bedside.  Patient resting comfortably in bed.  Admits to significant improvement in pain after morphine from [facility]. Placed a secure message to Dr. Rhea Belton with GI to inform  him that patient is here. Will discuss with hospitalist for admission.  8:05 AM Discussed with Dr. Katrinka Blazing with [facility] who agrees to admit patient.   Has PCP       Final Clinical Impression(s) / ED Diagnoses Final diagnoses:  Choledocholithiasis with acute cholecystitis    Rx / DC Orders ED Discharge Orders     None         Jesusita Oka 08/02/22 0818    Glynn Octave, MD 08/02/22 1152

## 2022-08-02 NOTE — Anesthesia Preprocedure Evaluation (Addendum)
Anesthesia Evaluation  Patient identified by MRN, date of birth, ID band Patient awake    Reviewed: Allergy & Precautions, H&P , NPO status , Patient's Chart, lab work & pertinent test results  Airway Mallampati: II  TM Distance: >3 FB Neck ROM: Full    Dental no notable dental hx. (+) Missing, Dental Advisory Given,    Pulmonary neg pulmonary ROS   Pulmonary exam normal breath sounds clear to auscultation       Cardiovascular hypertension, Normal cardiovascular exam Rhythm:Regular Rate:Normal     Neuro/Psych   Anxiety     negative neurological ROS  negative psych ROS   GI/Hepatic negative GI ROS, Neg liver ROS,,,  Endo/Other  negative endocrine ROS    Renal/GU negative Renal ROS  negative genitourinary   Musculoskeletal negative musculoskeletal ROS (+) Arthritis ,    Abdominal  (+) + obese  Peds negative pediatric ROS (+)  Hematology negative hematology ROS (+) Lab Results      Component                Value               Date                      WBC                      8.5                 08/02/2022                HGB                      12.4                08/02/2022                HCT                      37.7                08/02/2022                MCV                      95.7                08/02/2022                PLT                      195                 08/02/2022              Anesthesia Other Findings All: sulfa, Tramadol  Breast CA  Reproductive/Obstetrics negative OB ROS                              Anesthesia Physical Anesthesia Plan  ASA: 3  Anesthesia Plan: General   Post-op Pain Management:    Induction: Intravenous  PONV Risk Score and Plan: Treatment may vary due to age or medical condition, Midazolam, Ondansetron and Dexamethasone  Airway Management Planned: Oral ETT  Additional Equipment: None  Intra-op Plan:   Post-operative Plan:  Extubation in OR  Informed Consent: I  have reviewed the patients History and Physical, chart, labs and discussed the procedure including the risks, benefits and alternatives for the proposed anesthesia with the patient or authorized representative who has indicated his/her understanding and acceptance.     Dental advisory given  Plan Discussed with:   Anesthesia Plan Comments: (ERCP for choleduocolithiasis)         Anesthesia Quick Evaluation

## 2022-08-02 NOTE — ED Provider Notes (Addendum)
[facility] Provider Note    Event Date/Time   First MD Initiated Contact with Patient 08/02/22 0117     (approximate)   History   Abdominal Pain   HPI  Meredith Hamilton is a 72 y.o. female   Past medical history of breast cancer, hypertension, thyroid disease who presents to the emergency department with right upper quadrant pain.  Intermittent postprandial pain over the last 3 weeks but worsened today severe in nature.  Nausea but no vomiting.  No fever or chills.  History of appendectomy.  Independent Historian contributed to assessment above: Her daughter who is at bedside corroborates past medical history and history as above  External Medical Documents Reviewed: A CT scan of the abdomen pelvis back in 2019 showed normal gallbladder with no radiopaque cholelithiasis and no biliary ductal dilation      Physical Exam   Triage Vital Signs: ED Triage Vitals  Enc Vitals Group     BP 08/02/22 0016 (!) 161/61     Pulse Rate 08/02/22 0016 (!) 53     Resp 08/02/22 0016 18     Temp 08/02/22 0016 98.2 F (36.8 C)     Temp Source 08/02/22 0016 Oral     SpO2 08/02/22 0016 98 %     Weight 08/02/22 0015 189 lb (85.7 kg)     Height 08/02/22 0015  (1.676 m)     Head Circumference --      Peak Flow --      Pain Score 08/02/22 0015 10     Pain Loc --      Pain Edu? --      Excl. in GC? --     Most recent vital signs: Vitals:   08/02/22 0016 08/02/22 0330  BP: (!) 161/61 (!) 167/63  Pulse: (!) 53 98  Resp: 18 13  Temp: 98.2 F (36.8 C)   SpO2: 98% 96%    General: Awake, no distress.  CV:  Good peripheral perfusion.  Resp:  Normal effort.  Abd:  No distention.  Other:  Appears tired.  Nontoxic.  Hypertensive.  Bradycardic, afebrile.  Right upper quadrant tenderness to palpation with involuntary guarding.  Tender in the epigastrium as well but not to the degree as she is in the right upper   ED Results / Procedures / Treatments   Labs (all  labs ordered are listed, but only abnormal results are displayed) Labs Reviewed  COMPREHENSIVE METABOLIC PANEL - Abnormal; Notable for the following components:      Result Value   Potassium 2.8 (*)    Glucose, Bld 154 (*)    BUN 25 (*)    AST 132 (*)    ALT 108 (*)    Total Bilirubin 2.2 (*)    All other components within normal limits  CBC WITH DIFFERENTIAL/PLATELET  LIPASE, BLOOD  MAGNESIUM  URINALYSIS, ROUTINE W REFLEX MICROSCOPIC  TROPONIN I (HIGH SENSITIVITY)  TROPONIN I (HIGH SENSITIVITY)     I ordered and reviewed the above labs they are notable for potassium is low at 2.8.  Her LFTs are elevated in the low 100s with a high total bilirubin of 2.2  EKG  ED ECG REPORT I, Pilar Jarvis, the attending physician, personally viewed and interpreted this ECG.   Date: 08/02/2022  EKG Time: 0026  Rate: 45  Rhythm: sinus bradycardia  Axis: nl  Intervals:none  ST&T Change: no stemi    RADIOLOGY I independently reviewed and interpreted ultrasound of the  right upper quadrant and see a dilated common bile duct with what appears to be a stone in the common bile duct   PROCEDURES:  Critical Care performed: No  Procedures   MEDICATIONS ORDERED IN ED: Medications  potassium chloride 10 mEq in 100 mL IVPB (10 mEq Intravenous New Bag/Given 08/02/22 0350)  cefTRIAXone (ROCEPHIN) 1 g in sodium chloride 0.9 % 100 mL IVPB (has no administration in time range)  metroNIDAZOLE (FLAGYL) IVPB 500 mg (has no administration in time range)  potassium chloride SA (KLOR-CON M) CR tablet 40 mEq (40 mEq Oral Given 08/02/22 0223)  sodium chloride 0.9 % bolus 1,000 mL (1,000 mLs Intravenous New Bag/Given 08/02/22 0147)  morphine (PF) 4 MG/ML injection 4 mg (4 mg Intravenous Given 08/02/22 0142)  ondansetron (ZOFRAN) injection 4 mg (4 mg Intravenous Given 08/02/22 0141)    External physician / consultants:  I spoke with general surgery consultant regarding care plan for this patient.    IMPRESSION / MDM / ASSESSMENT AND PLAN / ED COURSE  I reviewed the triage vital signs and the nursing notes.                                Patient's presentation is most consistent with acute presentation with potential threat to life or bodily function.  Differential diagnosis includes, but is not limited to, cholecystitis, symptomatic cholelithiasis, pancreatitis, GERD or gastritis, ulcer, diverticulitis, pyelo, kidney stone   The patient is on the cardiac monitor to evaluate for evidence of arrhythmia and/or significant heart rate changes.  MDM: Postprandial right upper quadrant pain with tenderness and increased LFTs most suspicious for cholecystitis or biliary pathology.  Start with right upper quadrant ultrasound. Give IV morphine and IV Zofran for pain control, IV crystalloid bolus given poor p.o. intake due to symptoms over the last 3 weeks.  ---  Right upper quadrant ultrasound shows multiple findings, including positive sonographic Murphy sign with pericholecystic fluid and a small gallstone concerning for cholecystitis but also evidence of a common bile duct stone with dilation concerning for choledocholithiasis.  I started her on IV ceftriaxone and Flagyl and consulted with general surgery, plan for admission  -- ERCP Dr. Is not available at [facility] today so I consulted with the GI doctor at [facility] Dr. Corinda Gubler who will expect the patient for ERCP in the morning.  Dr. Margo Aye hospitalist and I spoke regarding the transfer and unfortunately there are no available inpatient beds at this time so instead suggested we do an ED to ED transfer, I then spoke with Dr. Clayborne Dana in the ED who accepted the patient for transfer.    She remained stable and comfortable pain well-controlled vital signs normal nontoxic recheck.      FINAL CLINICAL IMPRESSION(S) / ED DIAGNOSES   Final diagnoses:  Choledocholithiasis with acute cholecystitis     Rx / DC Orders    ED Discharge Orders     None        Note:  This document was prepared using Dragon voice recognition software and may include unintentional dictation errors.    Pilar Jarvis, MD 08/02/22 Donnal Debar    Pilar Jarvis, MD 08/02/22 [id number]

## 2022-08-02 NOTE — ED Triage Notes (Signed)
Pt tx from [place] RUQ pain onset of 3 weeks ago. Transferred here for ERCP. Pt is hypokalemic and has elevated LFT's. Arrives A+O, VSS, NADN.

## 2022-08-02 NOTE — H&P (View-Only) (Signed)
Consultation  Referring Provider:  [facility]  Primary Care Physician:  Erasmo Downer, MD Primary Gastroenterologist:  [facility]       Reason for Consultation:     Choledocholithiasis  LOS: 0 days          HPI:   Meredith Hamilton is a 72 y.o. female with past medical history significant for  past medical history significant for hypertension, anxiety, history of breast cancer, and thyroid disease presents for evaluation of choledocholithiasis  Patient originally presented to [facility] due to common bile duct stone.  Intermittent RUQ pain for 3 weeks, worse after meals. Reported severe epigastric pain that would wrap around to her back. States pain would come on after eating and last for about 3 days and then she could go a few days with no episodes of pain. Associated nausea, no vomiting.  However, episodes became progressively worse which led her to presenting to the emergency department.  RUQ ultrasound shows subcentimeter gallstone versus benign gallbladder polyp and distal common bile duct stone.  Common bile duct is 7.9 mm with a 5.8 mm shadowing distal stone acute cholecystitis. Denies weight loss, change in bowel habits.  Reports history of colonoscopy for screening done in [place] about 5 to 6 years ago, reportedly benign polyp.  Denies family history of colon cancer or GI issues.  Denies NSAID use, tobacco use, alcohol use.  Daughter and son-in law in room with patient  ED course: emergency department at [facility] patient was noted to be afebrile, pulse 53-98, respirations 13-25, blood pressure 134/58-167/63, and O2 saturations currently maintained on room air.  Labs significant for potassium 2.8 BUN 25, creatinine 0.93, glucose 154, lipase 28, AST 132, ALT 108, total bilirubin 2.2, and high-sensitivity troponins negative x 2. Patient was given potassium chloride 10 meq IV and 40 meq p.o., 1 L normal saline IV fluids, Rocephin, azithromycin, morphine, and Zofran   Past Medical History:   Diagnosis Date   Breast CA    Family history of breast cancer    Family history of lung cancer    Family history of throat cancer    Hashimoto's disease    Hypertension    Non-celiac gluten sensitivity     Surgical History:  She  has a past surgical history that includes Appendectomy (1979); Right oophorectomy (1979); Lumbar disc surgery (1974); Mastectomy w/ sentinel node biopsy (Left, 2019); and Ureteroscopy (Right, 2017). Family History:  Her family history includes Breast cancer (age of onset: 38) in her sister; Lung cancer (age of onset: 10) in her father and mother; Lung cancer (age of onset: 17) in her brother; Throat cancer in her brother. Social History:   reports that she has never smoked. She has never used smokeless tobacco. She reports that she does not currently use alcohol. She reports that she does not use drugs.  Prior to Admission medications   Medication Sig Start Date End Date Taking? Authorizing Provider  anastrozole (ARIMIDEX) 1 MG tablet TAKE 1 TABLET(1 MG) BY MOUTH DAILY Patient taking differently: Take 1 mg by mouth daily. 10/26/21  Yes Creig Hines, MD  BLACK COHOSH PO Take by mouth. 2 Tsp daily   Yes [provider]  Calcium-Magnesium (CAL-MAG PO) Take by mouth.   Yes [provider]  Cholecalciferol (VITAMIN D3) LIQD Take 6 drops by mouth daily.   Yes [provider]  hydrochlorothiazide (HYDRODIURIL) 12.5 MG tablet Take 1 tablet (12.5 mg total) by mouth daily. 03/31/22  Yes  Bacigalupo, Angela Marzella Schlein  OVER THE COUNTER MEDICATION Take 4 Scoops by mouth daily. Pecta Sol-C (Mod. Citrus Pectin)   Yes [provider]  OVER THE COUNTER MEDICATION Take 2 Scoops by mouth daily. Fermented Mushroom Blend   Yes [provider]  OVER THE COUNTER MEDICATION Take 6 capsules by mouth daily. Cruciferous Complete   Yes [provider]  OVER THE COUNTER MEDICATION Nitric Balance 4 tsp daily as needed   Yes [provider]  UNABLE TO FIND Take 10 drops by mouth daily. Med Name: Liquid Vitamin A   Yes [provider]  UNABLE TO FIND Med Name: Hydrex 2 vials daily   Yes [provider]  UNABLE TO FIND Med Name: Adrena Calm 1/4 pump daily   Yes [provider]  UNABLE TO FIND Med Name: BIOST skeletal and cellular Health 6 capsules daily   Yes [provider]  UNABLE TO FIND Med Name: A-C Carbamide 3 daily   Yes [provider]  UNABLE TO FIND Med Name: Derma Co 1 daily   Yes [provider]  naproxen (NAPROSYN) 500 MG tablet TAKE 1 TABLET(500 MG) BY MOUTH TWICE DAILY WITH A MEAL Patient not taking: Reported on 08/02/2022 06/01/22   Erasmo Downer, MD  triamcinolone ointment (KENALOG) 0.5 % Apply 1 application. topically 2 (two) times daily. Patient not taking: Reported on 08/02/2022 09/03/21   Erasmo Downer, MD    Current Facility-Administered Medications  Medication Dose Route Frequency Provider Last Rate Last Admin   0.9 % NaCl with KCl 20 mEq/ L  infusion   Intravenous Continuous Katrinka Blazing, Rondell A, MD       acetaminophen (TYLENOL) tablet 650 mg  650 mg Oral Q6H PRN Clydie Braun, MD       Or   acetaminophen (TYLENOL) suppository 650 mg  650 mg Rectal Q6H PRN Madelyn Flavors A, MD       albuterol (PROVENTIL) (2.5 MG/3ML) 0.083% nebulizer solution 2.5 mg  2.5 mg Nebulization Q6H PRN Smith, Rondell A, MD       cefTRIAXone (ROCEPHIN) 2 g in sodium chloride 0.9 % 100 mL IVPB  2 g Intravenous Q24H Smith, Rondell A, MD       enoxaparin (LOVENOX) injection 40 mg  40 mg Subcutaneous Q24H Smith, Rondell A, MD       metroNIDAZOLE (FLAGYL) IVPB 500 mg  500 mg Intravenous Q12H Smith, Rondell A, MD       morphine (PF) 2 MG/ML injection 2 mg  2 mg Intravenous Q3H PRN Katrinka Blazing, Rondell A, MD       ondansetron (ZOFRAN) tablet 4 mg  4 mg Oral Q6H PRN Madelyn Flavors A, MD       Or   ondansetron (ZOFRAN) injection 4 mg  4 mg Intravenous Q6H PRN Smith,  Rondell A, MD       sodium chloride flush (NS) 0.9 % injection 3 mL  3 mL Intravenous Q12H Smith, Rondell A, MD       Current Outpatient Medications  Medication Sig Dispense Refill   anastrozole (ARIMIDEX) 1 MG tablet TAKE 1 TABLET(1 MG) BY MOUTH DAILY (Patient taking differently: Take 1 mg by mouth daily.) 90 tablet 3   BLACK COHOSH PO Take by mouth. 2 Tsp daily     Calcium-Magnesium (CAL-MAG PO) Take by mouth.     Cholecalciferol (VITAMIN D3) LIQD Take 6 drops by mouth daily.     hydrochlorothiazide (HYDRODIURIL) 12.5 MG tablet Take 1 tablet (  12.5 mg total) by mouth daily. 90 tablet 3   OVER THE COUNTER MEDICATION Take 4 Scoops by mouth daily. Pecta Sol-C (Mod. Citrus Pectin)     OVER THE COUNTER MEDICATION Take 2 Scoops by mouth daily. Fermented Mushroom Blend     OVER THE COUNTER MEDICATION Take 6 capsules by mouth daily. Cruciferous Complete     OVER THE COUNTER MEDICATION Nitric Balance 4 tsp daily as needed     UNABLE TO FIND Take 10 drops by mouth daily. Med Name: Liquid Vitamin A     UNABLE TO FIND Med Name: Hydrex 2 vials daily     UNABLE TO FIND Med Name: Adrena Calm 1/4 pump daily     UNABLE TO FIND Med Name: BIOST skeletal and cellular Health 6 capsules daily     UNABLE TO FIND Med Name: A-C Carbamide 3 daily     UNABLE TO FIND Med Name: Derma Co 1 daily     naproxen (NAPROSYN) 500 MG tablet TAKE 1 TABLET(500 MG) BY MOUTH TWICE DAILY WITH A MEAL (Patient not taking: Reported on 08/02/2022) 180 tablet 1   triamcinolone ointment (KENALOG) 0.5 % Apply 1 application. topically 2 (two) times daily. (Patient not taking: Reported on 08/02/2022) 60 g 1    Allergies as of 08/02/2022 - Review Complete 08/02/2022  Allergen Reaction Noted   Other Rash 10/07/2021   Sulfa antibiotics Swelling 07/20/2017   Tramadol Other (See Comments) 07/20/2017    Review of Systems  Constitutional:  Negative for chills, fever and weight loss.  HENT:  Negative for hearing loss and tinnitus.   Eyes:   Negative for blurred vision and double vision.  Respiratory:  Negative for cough and hemoptysis.   Cardiovascular:  Negative for chest pain and palpitations.  Gastrointestinal:  Positive for abdominal pain and nausea. Negative for blood in stool, constipation, diarrhea, heartburn, melena and vomiting.  Genitourinary:  Negative for dysuria and urgency.  Musculoskeletal:  Negative for myalgias and neck pain.  Skin:  Negative for itching and rash.  Neurological:  Negative for seizures and loss of consciousness.  Psychiatric/Behavioral:  Negative for depression and suicidal ideas.        Physical Exam:  Vital signs in last 24 hours: Temp:  [97.9 F (36.6 C)-99.4 F (37.4 C)] 99.4 F (37.4 C) (04/23 0701) Pulse Rate:  [53-98] 63 (04/23 0800) Resp:  [13-25] 17 (04/23 0800) BP: (113-167)/(58-86) 113/86 (04/23 0800) SpO2:  [96 %-100 %] 99 % (04/23 0800) Weight:  [85 kg-85.7 kg] 85 kg (04/23 0656)   Last BM recorded by nurses in past 5 days No data recorded  Physical Exam Constitutional:      Appearance: She is well-developed.  HENT:     Head: Normocephalic and atraumatic.     Nose: Nose normal.     Mouth/Throat:     Mouth: Mucous membranes are moist.     Pharynx: Oropharynx is clear.  Eyes:     Extraocular Movements: Extraocular movements intact.     Conjunctiva/sclera: Conjunctivae normal.  Cardiovascular:     Rate and Rhythm: Normal rate and regular rhythm.  Pulmonary:     Effort: Pulmonary effort is normal.     Breath sounds: Normal breath sounds.  Abdominal:     General: Abdomen is flat. Bowel sounds are normal.     Palpations: Abdomen is soft.     Tenderness: There is abdominal tenderness (mild generalized).  Musculoskeletal:        General: No swelling. Normal range of  motion.     Cervical back: Normal range of motion. No rigidity.  Skin:    General: Skin is warm and dry.     Coloration: Skin is not jaundiced.  Neurological:     General: No focal deficit present.      Mental Status: She is alert and oriented to person, place, and time.  Psychiatric:        Mood and Affect: Mood normal.        Behavior: Behavior normal.        Thought Content: Thought content normal.        Judgment: Judgment normal.      LAB RESULTS: Recent Labs    08/02/22 0019  WBC 8.5  HGB 12.4  HCT 37.7  PLT 195   BMET Recent Labs    08/02/22 0019  NA 140  K 2.8*  CL 104  CO2 22  GLUCOSE 154*  BUN 25*  CREATININE 0.93  CALCIUM 9.1   LFT Recent Labs    08/02/22 0019  PROT 7.3  ALBUMIN 4.3  AST 132*  ALT 108*  ALKPHOS 117  BILITOT 2.2*   PT/INR No results for input(s): "LABPROT", "INR" in the last 72 hours.  STUDIES: US ABDOMEN LIMITED RUQ (LIVER/GB)  Result Date: 08/02/2022 CLINICAL DATA:  Intermittent abdominal pain. EXAM: ULTRASOUND ABDOMEN LIMITED RIGHT UPPER QUADRANT COMPARISON:  None Available. FINDINGS: Gallbladder: A 4.8 mm gallstone versus gallbladder polyp is seen within the dependent portion of the gallbladder lumen. The gallbladder wall measures 3.0 mm in thickness. Pericholecystic fluid is seen. A positive sonographic Eulah Pont sign is noted by sonographer. Common bile duct: Diameter: 7.9 mm with a 5.8 mm shadowing distal common bile duct stone. Liver: No focal lesion identified. Within normal limits in parenchymal echogenicity. Portal vein is patent on color Doppler imaging with normal direction of blood flow towards the liver. Other: A 12 mm renal calculus is seen within the lower pole of the right kidney. IMPRESSION: Subcentimeter gallstone versus benign gallbladder polyp and distal common bile duct stone, in the setting of a positive sonographic Murphy sign, suggestive of acute cholecystitis. Electronically Signed   By: Aram Candela M.D.   On: 08/02/2022 02:42      Impression    Choledocholithiasis with acute cholecystitis - RUQ ultrasound shows subcentimeter gallstone versus benign gallbladder polyp and distal common bile duct stone.   Common bile duct is 7.9 mm with a 5.8 mm shadowing distal stone acute cholecystitis - AST 132/ALT 108/alk phos 117 - T bili 2.2  History of breast cancer Medication regimen and includes Arimidex  Hypokalemia - K 2.8   Plan   - Daily CBC, CMET -Continue supportive care -Continue pain control. -Plan for ERCP, unsure timing. If not today, will give diet. -I thoroughly discussed procedure with the patient to include nature, alternatives, benefits, and risks (including but not limited to post ERCP pancreatitis, bleeding, infection, perforation, anesthesia/cardiac pulmonary complications). Discussed risk of pancreatitis associated with ERCP. Patient verbalized understanding and gave verbal consent to proceed with ERCP. - replenish potassium  -Patient will benefit from surgical consult this admission   Thank you for your kind consultation, we will continue to follow.   Meredith Hamilton Meredith Hamilton  08/02/2022, 8:47 AM

## 2022-08-02 NOTE — ED Notes (Signed)
Patient with rigors, tachypnea with respiratory rate 30-40's. Dr. Modesto Charon notified.

## 2022-08-03 ENCOUNTER — Inpatient Hospital Stay (HOSPITAL_COMMUNITY): Payer: Medicare HMO

## 2022-08-03 ENCOUNTER — Inpatient Hospital Stay (HOSPITAL_COMMUNITY): Payer: Medicare HMO | Admitting: Anesthesiology

## 2022-08-03 ENCOUNTER — Encounter (HOSPITAL_COMMUNITY)
Admission: EM | Disposition: A | Discharge status: Home / Self Care | Payer: Self-pay | Source: Home / Self Care | Attending: Emergency Medicine

## 2022-08-03 ENCOUNTER — Encounter (HOSPITAL_COMMUNITY): Payer: Self-pay | Admitting: Internal Medicine

## 2022-08-03 DIAGNOSIS — K5710 Diverticulosis of small intestine without perforation or abscess without bleeding: Secondary | ICD-10-CM

## 2022-08-03 DIAGNOSIS — I10 Essential (primary) hypertension: Secondary | ICD-10-CM | POA: Diagnosis not present

## 2022-08-03 DIAGNOSIS — K838 Other specified diseases of biliary tract: Secondary | ICD-10-CM | POA: Diagnosis not present

## 2022-08-03 DIAGNOSIS — E876 Hypokalemia: Secondary | ICD-10-CM | POA: Diagnosis not present

## 2022-08-03 DIAGNOSIS — K8040 Calculus of bile duct with cholecystitis, unspecified, without obstruction: Secondary | ICD-10-CM | POA: Diagnosis not present

## 2022-08-03 DIAGNOSIS — K8042 Calculus of bile duct with acute cholecystitis without obstruction: Secondary | ICD-10-CM | POA: Diagnosis not present

## 2022-08-03 DIAGNOSIS — F419 Anxiety disorder, unspecified: Secondary | ICD-10-CM | POA: Diagnosis not present

## 2022-08-03 DIAGNOSIS — R001 Bradycardia, unspecified: Secondary | ICD-10-CM | POA: Diagnosis not present

## 2022-08-03 HISTORY — PX: ERCP: SHX5425

## 2022-08-03 HISTORY — PX: SCLEROTHERAPY: SHX6841

## 2022-08-03 HISTORY — PX: SPHINCTEROTOMY: SHX5279

## 2022-08-03 LAB — CBC
HCT: 33.2 % — ABNORMAL LOW (ref 36.0–46.0)
Hemoglobin: 11.2 g/dL — ABNORMAL LOW (ref 12.0–15.0)
MCH: 32.3 pg (ref 26.0–34.0)
MCHC: 33.7 g/dL (ref 30.0–36.0)
MCV: 95.7 fL (ref 80.0–100.0)
Platelets: 130 10*3/uL — ABNORMAL LOW (ref 150–400)
RBC: 3.47 MIL/uL — ABNORMAL LOW (ref 3.87–5.11)
RDW: 13.2 % (ref 11.5–15.5)
WBC: 5.2 10*3/uL (ref 4.0–10.5)
nRBC: 0 % (ref 0.0–0.2)

## 2022-08-03 LAB — COMPREHENSIVE METABOLIC PANEL
ALT: 145 U/L — ABNORMAL HIGH (ref 0–44)
AST: 162 U/L — ABNORMAL HIGH (ref 15–41)
Albumin: 2.9 g/dL — ABNORMAL LOW (ref 3.5–5.0)
Alkaline Phosphatase: 95 U/L (ref 38–126)
Anion gap: 8 (ref 5–15)
BUN: 9 mg/dL (ref 8–23)
CO2: 21 mmol/L — ABNORMAL LOW (ref 22–32)
Calcium: 8.2 mg/dL — ABNORMAL LOW (ref 8.9–10.3)
Chloride: 110 mmol/L (ref 98–111)
Creatinine, Ser: 0.73 mg/dL (ref 0.44–1.00)
GFR, Estimated: >60 mL/min (ref >60)
Glucose, Bld: 85 mg/dL (ref 70–99)
Potassium: 3.2 mmol/L — ABNORMAL LOW (ref 3.5–5.1)
Sodium: 139 mmol/L (ref 135–145)
Total Bilirubin: 5.6 mg/dL — ABNORMAL HIGH (ref 0.3–1.2)
Total Protein: 5.6 g/dL — ABNORMAL LOW (ref 6.5–8.1)

## 2022-08-03 LAB — MRSA NEXT GEN BY PCR, NASAL: MRSA by PCR Next Gen: NOT DETECTED

## 2022-08-03 SURGERY — ERCP, WITH INTERVENTION IF INDICATED
Anesthesia: General

## 2022-08-03 MED ORDER — ROCURONIUM BROMIDE 10 MG/ML (PF) SYRINGE
PREFILLED_SYRINGE | INTRAVENOUS | Status: DC | PRN
  Administered 2022-08-03: 60 mg via INTRAVENOUS

## 2022-08-03 MED ORDER — INDOMETHACIN 50 MG RE SUPP
RECTAL | Status: AC
  Filled 2022-08-03: qty 2

## 2022-08-03 MED ORDER — SUGAMMADEX SODIUM 200 MG/2ML IV SOLN
INTRAVENOUS | Status: DC | PRN
  Administered 2022-08-03: 170 mg via INTRAVENOUS

## 2022-08-03 MED ORDER — GLUCAGON HCL RDNA (DIAGNOSTIC) 1 MG IJ SOLR
INTRAMUSCULAR | Status: AC
  Filled 2022-08-03: qty 1

## 2022-08-03 MED ORDER — DICLOFENAC SUPPOSITORY 100 MG
RECTAL | Status: DC | PRN
  Administered 2022-08-03: 100 mg via RECTAL

## 2022-08-03 MED ORDER — LIDOCAINE 2% (20 MG/ML) 5 ML SYRINGE
INTRAMUSCULAR | Status: DC | PRN
  Administered 2022-08-03: 60 mg via INTRAVENOUS

## 2022-08-03 MED ORDER — DICLOFENAC SUPPOSITORY 100 MG
RECTAL | Status: AC
  Filled 2022-08-03: qty 1

## 2022-08-03 MED ORDER — SODIUM CHLORIDE 0.9 % IV SOLN
INTRAVENOUS | Status: DC | PRN
  Administered 2022-08-03: 20 mL

## 2022-08-03 MED ORDER — LACTATED RINGERS IV SOLN: INTRAVENOUS | Status: DC | PRN

## 2022-08-03 MED ORDER — EPINEPHRINE 1 MG/10ML IJ SOSY
PREFILLED_SYRINGE | INTRAMUSCULAR | Status: AC
  Filled 2022-08-03: qty 10

## 2022-08-03 MED ORDER — FENTANYL CITRATE (PF) 100 MCG/2ML IJ SOLN
INTRAMUSCULAR | Status: DC | PRN
  Administered 2022-08-03: 100 ug via INTRAVENOUS

## 2022-08-03 MED ORDER — CHLORHEXIDINE GLUCONATE CLOTH 2 % EX PADS
6.0000 | MEDICATED_PAD | Freq: Once | CUTANEOUS | Status: AC
  Administered 2022-08-03: 6 via TOPICAL

## 2022-08-03 MED ORDER — PHENYLEPHRINE 80 MCG/ML (10ML) SYRINGE FOR IV PUSH (FOR BLOOD PRESSURE SUPPORT)
PREFILLED_SYRINGE | INTRAVENOUS | Status: DC | PRN
  Administered 2022-08-03: 80 ug via INTRAVENOUS

## 2022-08-03 MED ORDER — DEXAMETHASONE SODIUM PHOSPHATE 10 MG/ML IJ SOLN
INTRAMUSCULAR | Status: DC | PRN
  Administered 2022-08-03: 4 mg via INTRAVENOUS

## 2022-08-03 MED ORDER — GLUCAGON HCL RDNA (DIAGNOSTIC) 1 MG IJ SOLR
INTRAMUSCULAR | Status: DC | PRN
  Administered 2022-08-03: .5 mg via INTRAVENOUS

## 2022-08-03 MED ORDER — FENTANYL CITRATE (PF) 100 MCG/2ML IJ SOLN
25.0000 ug | Freq: Once | INTRAMUSCULAR | Status: AC
  Administered 2022-08-03: 25 ug via INTRAVENOUS

## 2022-08-03 MED ORDER — ONDANSETRON HCL 4 MG/2ML IJ SOLN
INTRAMUSCULAR | Status: DC | PRN
  Administered 2022-08-03: 4 mg via INTRAVENOUS

## 2022-08-03 MED ORDER — PROPOFOL 10 MG/ML IV BOLUS
INTRAVENOUS | Status: DC | PRN
  Administered 2022-08-03: 120 mg via INTRAVENOUS
  Administered 2022-08-03: 80 mg via INTRAVENOUS

## 2022-08-03 MED ORDER — SODIUM CHLORIDE (PF) 0.9 % IJ SOLN
PREFILLED_SYRINGE | INTRAMUSCULAR | Status: DC | PRN
  Administered 2022-08-03: 2.5 mL

## 2022-08-03 MED ORDER — EPHEDRINE SULFATE-NACL 50-0.9 MG/10ML-% IV SOSY
PREFILLED_SYRINGE | INTRAVENOUS | Status: DC | PRN
  Administered 2022-08-03 (×2): 5 mg via INTRAVENOUS

## 2022-08-03 MED ORDER — LACTATED RINGERS IV SOLN: Freq: Once | INTRAVENOUS | Status: AC

## 2022-08-03 MED ORDER — KCL IN DEXTROSE-NACL 20-5-0.45 MEQ/L-%-% IV SOLN
INTRAVENOUS | Status: DC
  Filled 2022-08-03 (×2): qty 1000

## 2022-08-03 MED ORDER — FENTANYL CITRATE (PF) 100 MCG/2ML IJ SOLN
INTRAMUSCULAR | Status: AC
  Filled 2022-08-03: qty 2

## 2022-08-03 NOTE — Interval H&P Note (Signed)
History and Physical Interval Note:  08/03/2022 1:00 PM  Meredith Hamilton  has presented today for surgery, with the diagnosis of Choledocholithiasis.  The various methods of treatment have been discussed with the patient and family. After consideration of risks, benefits and other options for treatment, the patient has consented to  Procedure(s): ENDOSCOPIC RETROGRADE CHOLANGIOPANCREATOGRAPHY (ERCP) (N/A) as a surgical intervention.  The patient's history has been reviewed, patient examined, no change in status, stable for surgery.  I have reviewed the patient's chart and labs.  Questions were answered to the patient's satisfaction.     Stan Head

## 2022-08-03 NOTE — Anesthesia Preprocedure Evaluation (Signed)
Anesthesia Evaluation  Patient identified by MRN, date of birth, ID band Patient awake    Reviewed: Allergy & Precautions, NPO status , Patient's Chart, lab work & pertinent test results  History of Anesthesia Complications Negative for: history of anesthetic complications  Airway Mallampati: III  TM Distance: <3 FB Neck ROM: Full   Comment: Previous grade IV view with MAC 3, easy mask Dental  (+) Dental Advisory Given,    Pulmonary neg pulmonary ROS   Pulmonary exam normal breath sounds clear to auscultation       Cardiovascular hypertension, Pt. on medications (-) angina (-) Past MI, (-) Cardiac Stents and (-) CABG + dysrhythmias (bradycardia)  Rhythm:Regular Rate:Normal     Neuro/Psych  PSYCHIATRIC DISORDERS Anxiety     negative neurological ROS     GI/Hepatic Neg liver ROS,neg GERD  ,,cholecystitis   Endo/Other  neg diabetes  H/o Hashimoto's thyroiditis  Renal/GU negative Renal ROS     Musculoskeletal  (+) Arthritis ,    Abdominal   Peds  Hematology negative hematology ROS (+)   Anesthesia Other Findings H/o breast cancer  Reproductive/Obstetrics                             Anesthesia Physical Anesthesia Plan  ASA: 3  Anesthesia Plan: General   Post-op Pain Management:    Induction: Intravenous  PONV Risk Score and Plan: 3 and Ondansetron, Dexamethasone and Treatment may vary due to age or medical condition  Airway Management Planned: Oral ETT  Additional Equipment:   Intra-op Plan:   Post-operative Plan: Extubation in OR  Informed Consent: I have reviewed the patients History and Physical, chart, labs and discussed the procedure including the risks, benefits and alternatives for the proposed anesthesia with the patient or authorized representative who has indicated his/her understanding and acceptance.     Dental advisory given  Plan Discussed with:  Anesthesiologist and CRNA  Anesthesia Plan Comments: (Risks of general anesthesia discussed including, but not limited to, sore throat, hoarse voice, chipped/damaged teeth, injury to vocal cords, nausea and vomiting, allergic reactions, lung infection, heart attack, stroke, and death. All questions answered. )       Anesthesia Quick Evaluation

## 2022-08-03 NOTE — Transfer of Care (Signed)
Immediate Anesthesia Transfer of Care Note  Patient: Meredith Hamilton  Procedure(s) Performed: ENDOSCOPIC RETROGRADE CHOLANGIOPANCREATOGRAPHY (ERCP) REMOVAL OF SLUDGE SPHINCTEROTOMY SCLEROTHERAPY  Patient Location: Endoscopy Unit  Anesthesia Type:General  Level of Consciousness: awake and alert   Airway & Oxygen Therapy: Patient Spontanous Breathing and Patient connected to nasal cannula oxygen  Post-op Assessment: Report given to RN and Post -op Vital signs reviewed and stable  Post vital signs: Reviewed and stable  Last Vitals:  Vitals Value Taken Time  BP 148/66   Temp    Pulse 58   Resp 16   SpO2 96     Last Pain:  Vitals:   08/03/22 1213  TempSrc: Temporal  PainSc: 0-No pain         Complications: No notable events documented.

## 2022-08-03 NOTE — Op Note (Addendum)
[facility] Patient Name: Meredith Hamilton Procedure Date : 08/03/2022 MRN: [id number] Attending MD: Iva Boop , MD, [id number] Date of Birth: 06/03/1950 CSN: [id number] Age: 72 Admit Type: Inpatient Procedure:                ERCP Indications:              Suspected bile duct stone(s) Providers:                Iva Boop, MD, Adolph Pollack, RN, Priscella Mann, Technician Referring MD:              Medicines:                Monitored Anesthesia Care Complications:            Minor bleeding - stopped spontaneously Estimated Blood Loss:     Estimated blood loss was minimal. Procedure:                Pre-Anesthesia Assessment:                           - Prior to the procedure, a History and Physical                            was performed, and patient medications and                            allergies were reviewed. The patient's tolerance of                            previous anesthesia was also reviewed. The risks                            and benefits of the procedure and the sedation                            options and risks were discussed with the patient.                            All questions were answered, and informed consent                            was obtained. Prior Anticoagulants: The patient                            last took Lovenox (enoxaparin) 1 day prior to the                            procedure. ASA Grade Assessment: III - A patient                            with severe systemic disease. After reviewing the  risks and benefits, the patient was deemed in                            satisfactory condition to undergo the procedure.                           After obtaining informed consent, the scope was                            passed under direct vision. Throughout the                            procedure, the patient's blood pressure, pulse, and                            oxygen  saturations were monitored continuously. The                            [employer] D single use                            duodenoscope was introduced through the mouth, and                            used to inject contrast into and used to inject                            contrast into the bile duct. The ERCP was                            accomplished without difficulty. The patient                            tolerated the procedure well. Scope In: Scope Out: Findings:      The scout film was normal. Esophagus blindly intubated (at first) and       then NL on EGD. Stomach normal. Duodenum normal. There was a normal       papilla on edge of a diverticulum. did not see bile draining. cannulated       CBC with wire, contrast injected and dilated common duct noted - 10 mm       max. Intrahepatics slightly dilated. no stone seen with certainty but       since imaging had shown one I performed biliary sphincterotomy over a       wire. There was asteady ooze of blood from top of 4-5 mm sphincterotomy.       Balloon sweeps and occlusion cholangiograms did not show or produce a       stone. Gallbladder did fill during occulison cholangiogram. No       pancreatogram. Balloon tamponade used to slow bleeding from       sphincterotomy at papilla. EPI 1:10K injected in 1 cc aliquots left,       right and apex of sphincterotomy. Bleeding lessened ansd clots formed       and bleeding stopped. this was confirmed with gastroscope views also. Impression:               -  The entire biliary tree was mildly dilated.                           - Duodenal diverticulum. Recommendation:           - return to floor                           I stopped Lovenox and ordered SCD                           lap chole tomorrow if ok                           clears today NPO after MN                           observe for signs of significant post                            sphincterotomy bleeding  which would be melena vs                            hematochezia, decreased Hgb                           In recovery had mild epigastric tenderness and dull                            epigastric pain - I suspect this is from EPI                            injection and should dissipate. Will follow.                           Called daughter sharon draper and updated her Procedure Code(s):        --- Professional ---                           [phone], Endoscopic retrograde                            cholangiopancreatography (ERCP); with                            sphincterotomy/papillotomy Diagnosis Code(s):        --- Professional ---                           K83.8, Other specified diseases of biliary tract                           K57.10, Diverticulosis of small intestine without                            perforation or abscess without bleeding CPT copyright 2022 [employer]. All rights reserved. The codes documented in this  report are preliminary and upon coder review may  be revised to meet current compliance requirements. Iva Boop, MD 08/03/2022 2:27:30 PM This report has been signed electronically. Number of Addenda: 0

## 2022-08-03 NOTE — Anesthesia Procedure Notes (Signed)
Procedure Name: Intubation Date/Time: 08/03/2022 1:08 PM  Performed by: Rachel Moulds, CRNAPre-anesthesia Checklist: Patient identified, Emergency Drugs available, Suction available, Patient being monitored and Timeout performed Patient Re-evaluated:Patient Re-evaluated prior to induction Oxygen Delivery Method: Circle system utilized Preoxygenation: Pre-oxygenation with 100% oxygen Induction Type: IV induction Ventilation: Mask ventilation without difficulty Laryngoscope Size: Mac and 3 Grade View: Grade IV Tube type: Oral Tube size: 7.0 mm Number of attempts: 1 Airway Equipment and Method: Stylet Placement Confirmation: CO2 detector, positive ETCO2 and ETT inserted through vocal cords under direct vision Secured at: 21 cm Tube secured with: Tape Dental Injury: Teeth and Oropharynx as per pre-operative assessment

## 2022-08-03 NOTE — Anesthesia Postprocedure Evaluation (Signed)
Anesthesia Post Note  Patient: Meredith Hamilton  Procedure(s) Performed: ENDOSCOPIC RETROGRADE CHOLANGIOPANCREATOGRAPHY (ERCP) REMOVAL OF SLUDGE SPHINCTEROTOMY SCLEROTHERAPY     Patient location during evaluation: Endoscopy Anesthesia Type: General Level of consciousness: patient cooperative, oriented and sedated Pain management: pain level controlled Vital Signs Assessment: post-procedure vital signs reviewed and stable Respiratory status: nonlabored ventilation, spontaneous breathing and respiratory function stable Cardiovascular status: blood pressure returned to baseline and stable Postop Assessment: no apparent nausea or vomiting Anesthetic complications: no   No notable events documented.  Last Vitals:  Vitals:   08/03/22 1440 08/03/22 1453  BP: (!) 149/69 130/72  Pulse: (!) 57 (!) 53  Resp: 13 12  Temp:    SpO2: 92% 95%    Last Pain:  Vitals:   08/03/22 1453  TempSrc:   PainSc: 7                  [name],E. Garek Schuneman

## 2022-08-03 NOTE — Progress Notes (Signed)
[facility]                                                                              Meredith Hamilton, is a 72 y.o. female, DOB - [birth year], ZOX:[id number] Admit date - 08/02/2022    Outpatient Primary MD for the patient is Beryle Flock, Marzella Schlein, MD  LOS - 1  days  Chief Complaint  Patient presents with   Abdominal Pain       Brief summary   Patient is a 72 year old female with HTN, Hashimoto's disease, breast CA presented with right upper quadrant abdominal pain over the last 3 weeks.  Described as crampy, normally occurring after eating meals hence decreased p.o. intake.  Endorsed nausea but no fevers or chills. In ED at [facility], noted to be afebrile, RR 13-25, pulse 53-98, BP stable. K2.8, creatinine 0.93, lipase 28, AST 132, ALT 108, total bilirubin 2.2.  RUQ [place] noted a subcentimeter gallstone versus benign gallbladder polyp and a distal CBD stone in the setting of positive sonographic Murphy sign suggestive of acute cholecystitis. Patient was transferred to [facility] and admitted for further workup.    Assessment & Plan    Principal Problem:   Choledocholithiasis with acute cholecystitis, acute transaminitis -Presented with RUQ abdominal pain with transaminitis.  - RUQ [place] + gallstones, distal CBD stone pericholecystic fluid, positive Murphy sign concerning for acute cholecystitis -Mild abdominal pain this morning on exam, n.p.o. undergoing ERCP today -General surgery consulted, plan for cholecystectomy in a.m. -N.p.o., placed on D5 half-normal saline with K until tolerating diet.  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   -Continue Arimidex once  able  Obesity Estimated body mass index is 30.25 kg/m as calculated from the following:   Height as of this encounter:  (1.676 m).   Weight as of this encounter: 85 kg.  Code Status: Full code DVT Prophylaxis:  enoxaparin (LOVENOX) injection 40 mg Start: 08/02/22 2200   Level of Care: Level of care: Telemetry Medical Family Communication: Updated patient Disposition Plan:      Remains inpatient appropriate: DC once cleared by surgery after cholecystectomy   Procedures:    Consultants:   GI General surgery  Antimicrobials:   Anti-infectives (From admission, onward)    Start     Dose/Rate Route Frequency Ordered Stop   08/03/22 1200  [MAR Hold]  ciprofloxacin (CIPRO) IVPB 400 mg        (MAR Hold since Wed 08/03/2022 at 1214.Hold Reason: Transfer to a Procedural area)   400 mg 200 mL/hr over 60 Minutes Intravenous  Once 08/02/22 1942     08/02/22 2200  [MAR Hold]  cefTRIAXone (ROCEPHIN) 2 g in sodium chloride 0.9 % 100 mL IVPB        (MAR Hold since Wed 08/03/2022 at 1214.Hold Reason: Transfer to a Procedural area)   2 g 200 mL/hr over 30 Minutes  Intravenous Every 24 hours 08/02/22 0827     08/02/22 1800  [MAR Hold]  metroNIDAZOLE (FLAGYL) IVPB 500 mg        (MAR Hold since Wed 08/03/2022 at 1214.Hold Reason: Transfer to a Procedural area)   500 mg 100 mL/hr over 60 Minutes Intravenous Every 12 hours 08/02/22 0827            Medications  [MAR Hold] enoxaparin (LOVENOX) injection  40 mg Subcutaneous Q24H   [MAR Hold] indomethacin  100 mg Rectal Once   [MAR Hold] sodium chloride flush  3 mL Intravenous Q12H      Subjective:   Meredith Hamilton was seen and examined today.  Seen this morning, mild abdominal pain in the RUQ region, no active nausea vomiting, n.p.o. for ERCP today.  Patient denies dizziness, chest pain, shortness of breath. No acute events overnight.    Objective:   Vitals:   08/02/22 2156 08/03/22 0430 08/03/22 0807 08/03/22 1213  BP: (!) 111/50  118/60 132/69 (!) 148/85  Pulse: (!) 58  (!) 52 (!) 52  Resp: 16 15 18  (!) 22  Temp: 98.4 F (36.9 C) 98.1 F (36.7 C) (!) 97.5 F (36.4 C) (!) 97.5 F (36.4 C)  TempSrc: Oral Oral Oral Temporal  SpO2: 94% 96% 97% 96%  Weight:      Height:        Intake/Output Summary (Last 24 hours) at 08/03/2022 1303 Last data filed at 08/03/2022 0651 Gross per 24 hour  Intake 1839.62 ml  Output --  Net 1839.62 ml     Wt Readings from Last 3 Encounters:  08/02/22 85 kg  08/02/22 85.7 kg  07/05/22 92.5 kg     Exam General: Alert and oriented x 3, NAD Cardiovascular: S1 S2 auscultated,  RRR Respiratory: Clear to auscultation bilaterally, no wheezing Gastrointestinal: Soft, RUQ TTP  nondistended, + bowel sounds Ext: no pedal edema bilaterally Neuro: no new FND's psych: Normal affect     Data Reviewed:  I have personally reviewed following labs    CBC Lab Results  Component Value Date   WBC 5.2 08/03/2022   RBC 3.47 (L) 08/03/2022   HGB 11.2 (L) 08/03/2022   HCT 33.2 (L) 08/03/2022   MCV 95.7 08/03/2022   MCH 32.3 08/03/2022   PLT 130 (L) 08/03/2022   MCHC 33.7 08/03/2022   RDW 13.2 08/03/2022   LYMPHSABS 1.9 08/02/2022   MONOABS 0.5 08/02/2022   EOSABS 0.1 08/02/2022   BASOSABS 0.0 08/02/2022     Last metabolic panel Lab Results  Component Value Date   NA 139 08/03/2022   K 3.2 (L) 08/03/2022   CL 110 08/03/2022   CO2 21 (L) 08/03/2022   BUN 9 08/03/2022   CREATININE 0.73 08/03/2022   GLUCOSE 85 08/03/2022   GFRNONAA >60 08/03/2022   GFRAA 93 02/27/2020   CALCIUM 8.2 (L) 08/03/2022   PHOS 3.9 01/16/2018   PROT 5.6 (L) 08/03/2022   ALBUMIN 2.9 (L) 08/03/2022   LABGLOB 2.5 10/07/2021   AGRATIO 2.0 10/07/2021   BILITOT 5.6 (H) 08/03/2022   ALKPHOS 95 08/03/2022   AST 162 (H) 08/03/2022   ALT 145 (H) 08/03/2022   ANIONGAP 8 08/03/2022    CBG (last 3)  No results for input(s): "GLUCAP" in the last 72 hours.    Coagulation Profile: No results for  input(s): "INR", "PROTIME" in the last 168 hours.   Radiology Studies: I have personally reviewed the imaging studies  US ABDOMEN LIMITED RUQ (LIVER/GB)  Result  Date: 08/02/2022 CLINICAL DATA:  Intermittent abdominal pain. EXAM: ULTRASOUND ABDOMEN LIMITED RIGHT UPPER QUADRANT COMPARISON:  None Available. FINDINGS: Gallbladder: A 4.8 mm gallstone versus gallbladder polyp is seen within the dependent portion of the gallbladder lumen. The gallbladder wall measures 3.0 mm in thickness. Pericholecystic fluid is seen. A positive sonographic Eulah Pont sign is noted by sonographer. Common bile duct: Diameter: 7.9 mm with a 5.8 mm shadowing distal common bile duct stone. Liver: No focal lesion identified. Within normal limits in parenchymal echogenicity. Portal vein is patent on color Doppler imaging with normal direction of blood flow towards the liver. Other: A 12 mm renal calculus is seen within the lower pole of the right kidney. IMPRESSION: Subcentimeter gallstone versus benign gallbladder polyp and distal common bile duct stone, in the setting of a positive sonographic Murphy sign, suggestive of acute cholecystitis. Electronically Signed   By: Aram Candela M.D.   On: 08/02/2022 02:42       Patrick Sohm M.D. [facility] 08/03/2022, 1:03 PM  Available via Epic secure chat 7am-7pm After 7 pm, please refer to night coverage provider listed on amion.

## 2022-08-03 NOTE — Progress Notes (Signed)
[facility] Progress Note     Subjective: CC:  Reports feeling a little weak but overall well. Reports mild abdominal discomfort but denies severe pain. Denies nausea or vomiting. Mobilizing in her room and states she just had a normal BM.   Objective: Vital signs in last 24 hours: Temp:  [97.5 F (36.4 C)-98.4 F (36.9 C)] 97.5 F (36.4 C) (04/24 0807) Pulse Rate:  [52-59] 52 (04/24 0807) Resp:  [15-21] 18 (04/24 0807) BP: (111-148)/(49-69) 132/69 (04/24 0807) SpO2:  [94 %-100 %] 97 % (04/24 0807)    Intake/Output from previous day: 04/23 0701 - 04/24 0700 In: 1839.6 [I.V.:1544.9; IV Piggyback:294.8] Out: -  Intake/Output this shift: No intake/output data recorded.  PE: Gen:  Alert, NAD, pleasant Card:  Regular rate and rhythm Pulm:  Normal effort ORA Abd: Soft, non-tender, non-distended, no HSM Skin: warm and dry, no rashes  Psych: A&Ox3   Lab Results:  Recent Labs    08/02/22 0019 08/03/22 0429  WBC 8.5 5.2  HGB 12.4 11.2*  HCT 37.7 33.2*  PLT 195 130*   BMET Recent Labs    08/02/22 0019 08/03/22 0429  NA 140 139  K 2.8* 3.2*  CL 104 110  CO2 22 21*  GLUCOSE 154* 85  BUN 25* 9  CREATININE 0.93 0.73  CALCIUM 9.1 8.2*   PT/INR No results for input(s): "LABPROT", "INR" in the last 72 hours. CMP     Component Value Date/Time   NA 139 08/03/2022 0429   NA 142 05/10/2022 1008   K 3.2 (L) 08/03/2022 0429   CL 110 08/03/2022 0429   CL 106 01/16/2018 0000   CO2 21 (L) 08/03/2022 0429   CO2 20 01/16/2018 0000   GLUCOSE 85 08/03/2022 0429   BUN 9 08/03/2022 0429   BUN 16 05/10/2022 1008   CREATININE 0.73 08/03/2022 0429   CALCIUM 8.2 (L) 08/03/2022 0429   CALCIUM 9.8 01/16/2018 0000   PROT 5.6 (L) 08/03/2022 0429   PROT 7.4 10/07/2021 1009   ALBUMIN 2.9 (L) 08/03/2022 0429   ALBUMIN 4.9 (H) 10/07/2021 1009   ALBUMIN 4.5 01/16/2018 0000   AST 162 (H) 08/03/2022 0429   ALT 145 (H) 08/03/2022 0429   ALKPHOS 95 08/03/2022 0429    BILITOT 5.6 (H) 08/03/2022 0429   BILITOT 0.5 10/07/2021 1009   GFRNONAA >60 08/03/2022 0429   GFRAA 93 02/27/2020 1522   Lipase     Component Value Date/Time   LIPASE 28 08/02/2022 0019       Studies/Results: US ABDOMEN LIMITED RUQ (LIVER/GB)  Result Date: 08/02/2022 CLINICAL DATA:  Intermittent abdominal pain. EXAM: ULTRASOUND ABDOMEN LIMITED RIGHT UPPER QUADRANT COMPARISON:  None Available. FINDINGS: Gallbladder: A 4.8 mm gallstone versus gallbladder polyp is seen within the dependent portion of the gallbladder lumen. The gallbladder wall measures 3.0 mm in thickness. Pericholecystic fluid is seen. A positive sonographic Eulah Pont sign is noted by sonographer. Common bile duct: Diameter: 7.9 mm with a 5.8 mm shadowing distal common bile duct stone. Liver: No focal lesion identified. Within normal limits in parenchymal echogenicity. Portal vein is patent on color Doppler imaging with normal direction of blood flow towards the liver. Other: A 12 mm renal calculus is seen within the lower pole of the right kidney. IMPRESSION: Subcentimeter gallstone versus benign gallbladder polyp and distal common bile duct stone, in the setting of a positive sonographic Murphy sign, suggestive of acute cholecystitis. Electronically Signed   By: Aram Candela M.D.   On: 08/02/2022 02:42  Anti-infectives: Anti-infectives (From admission, onward)    Start     Dose/Rate Route Frequency Ordered Stop   08/03/22 1200  ciprofloxacin (CIPRO) IVPB 400 mg        400 mg 200 mL/hr over 60 Minutes Intravenous  Once 08/02/22 1942     08/02/22 2200  cefTRIAXone (ROCEPHIN) 2 g in sodium chloride 0.9 % 100 mL IVPB        2 g 200 mL/hr over 30 Minutes Intravenous Every 24 hours 08/02/22 0827     08/02/22 1800  metroNIDAZOLE (FLAGYL) IVPB 500 mg        500 mg 100 mL/hr over 60 Minutes Intravenous Every 12 hours 08/02/22 0827          Assessment/Plan  Choledocholithiasis  - RUQ U/S 4/23 shows cholelithiasis as  well as a stone in the distal common bile duct - afebrile, normotensive, WBC 5.2 - AST 162, ALT 145, bili 5.6 from 2.2 >> ERCP today by GI - agree with Rocephin for possible cholecystitis given positive sonographic Murphy sign  - will tentatively plan for lap chole tomorrow 4/25, patient agreeable to this plan.    FEN - ok for diet per GI after procedure, NPO after MN for surgery tomorrow VTE - SCDs's ID - Rocephin Admit - [facility] service    LOS: 1 day   I reviewed nursing notes, Consultant GI notes, last 24 h vitals and pain scores, last 48 h intake and output, last 24 h labs and trends, and last 24 h imaging results.    Hosie Spangle, PA-C [facility] Please see Amion for pager number during day hours 7:00am-4:30pm

## 2022-08-04 ENCOUNTER — Encounter (HOSPITAL_COMMUNITY)
Admission: EM | Disposition: A | Discharge status: Home / Self Care | Payer: Self-pay | Source: Home / Self Care | Attending: Emergency Medicine

## 2022-08-04 ENCOUNTER — Observation Stay (HOSPITAL_COMMUNITY): Payer: Medicare HMO | Admitting: Certified Registered"

## 2022-08-04 ENCOUNTER — Observation Stay (HOSPITAL_BASED_OUTPATIENT_CLINIC_OR_DEPARTMENT_OTHER): Payer: Medicare HMO | Admitting: Certified Registered"

## 2022-08-04 DIAGNOSIS — K8012 Calculus of gallbladder with acute and chronic cholecystitis without obstruction: Secondary | ICD-10-CM

## 2022-08-04 DIAGNOSIS — I10 Essential (primary) hypertension: Secondary | ICD-10-CM

## 2022-08-04 DIAGNOSIS — F419 Anxiety disorder, unspecified: Secondary | ICD-10-CM | POA: Diagnosis not present

## 2022-08-04 DIAGNOSIS — K8040 Calculus of bile duct with cholecystitis, unspecified, without obstruction: Secondary | ICD-10-CM | POA: Diagnosis not present

## 2022-08-04 DIAGNOSIS — R001 Bradycardia, unspecified: Secondary | ICD-10-CM | POA: Diagnosis not present

## 2022-08-04 DIAGNOSIS — K8042 Calculus of bile duct with acute cholecystitis without obstruction: Secondary | ICD-10-CM | POA: Diagnosis not present

## 2022-08-04 DIAGNOSIS — R7989 Other specified abnormal findings of blood chemistry: Secondary | ICD-10-CM | POA: Diagnosis not present

## 2022-08-04 HISTORY — PX: CHOLECYSTECTOMY: SHX55

## 2022-08-04 LAB — COMPREHENSIVE METABOLIC PANEL
ALT: 137 U/L — ABNORMAL HIGH (ref 0–44)
ALT: 138 U/L — ABNORMAL HIGH (ref 0–44)
AST: 132 U/L — ABNORMAL HIGH (ref 15–41)
AST: 149 U/L — ABNORMAL HIGH (ref 15–41)
Albumin: 3.2 g/dL — ABNORMAL LOW (ref 3.5–5.0)
Albumin: 3.1 g/dL — ABNORMAL LOW (ref 3.5–5.0)
Alkaline Phosphatase: 96 U/L (ref 38–126)
Alkaline Phosphatase: 99 U/L (ref 38–126)
Anion gap: 10 (ref 5–15)
Anion gap: 10 (ref 5–15)
BUN: 12 mg/dL (ref 8–23)
BUN: 7 mg/dL — ABNORMAL LOW (ref 8–23)
CO2: 21 mmol/L — ABNORMAL LOW (ref 22–32)
CO2: 21 mmol/L — ABNORMAL LOW (ref 22–32)
Calcium: 8.8 mg/dL — ABNORMAL LOW (ref 8.9–10.3)
Calcium: 8.5 mg/dL — ABNORMAL LOW (ref 8.9–10.3)
Chloride: 109 mmol/L (ref 98–111)
Chloride: 105 mmol/L (ref 98–111)
Creatinine, Ser: 0.8 mg/dL (ref 0.44–1.00)
Creatinine, Ser: 0.65 mg/dL (ref 0.44–1.00)
GFR, Estimated: >60 mL/min (ref >60)
GFR, Estimated: >60 mL/min (ref >60)
Glucose, Bld: 116 mg/dL — ABNORMAL HIGH (ref 70–99)
Glucose, Bld: 141 mg/dL — ABNORMAL HIGH (ref 70–99)
Potassium: 3.7 mmol/L (ref 3.5–5.1)
Potassium: 3.1 mmol/L — ABNORMAL LOW (ref 3.5–5.1)
Sodium: 140 mmol/L (ref 135–145)
Sodium: 136 mmol/L (ref 135–145)
Total Bilirubin: 2.1 mg/dL — ABNORMAL HIGH (ref 0.3–1.2)
Total Bilirubin: 2.1 mg/dL — ABNORMAL HIGH (ref 0.3–1.2)
Total Protein: 6.1 g/dL — ABNORMAL LOW (ref 6.5–8.1)
Total Protein: 6 g/dL — ABNORMAL LOW (ref 6.5–8.1)

## 2022-08-04 LAB — CBC
HCT: 33.6 % — ABNORMAL LOW (ref 36.0–46.0)
HCT: 34.6 % — ABNORMAL LOW (ref 36.0–46.0)
Hemoglobin: 11.5 g/dL — ABNORMAL LOW (ref 12.0–15.0)
Hemoglobin: 11.2 g/dL — ABNORMAL LOW (ref 12.0–15.0)
MCH: 31.9 pg (ref 26.0–34.0)
MCH: 31.1 pg (ref 26.0–34.0)
MCHC: 34.2 g/dL (ref 30.0–36.0)
MCHC: 32.4 g/dL (ref 30.0–36.0)
MCV: 93.1 fL (ref 80.0–100.0)
MCV: 96.1 fL (ref 80.0–100.0)
Platelets: 147 10*3/uL — ABNORMAL LOW (ref 150–400)
Platelets: 146 10*3/uL — ABNORMAL LOW (ref 150–400)
RBC: 3.61 MIL/uL — ABNORMAL LOW (ref 3.87–5.11)
RBC: 3.6 MIL/uL — ABNORMAL LOW (ref 3.87–5.11)
RDW: 13 % (ref 11.5–15.5)
RDW: 12.9 % (ref 11.5–15.5)
WBC: 6.4 10*3/uL (ref 4.0–10.5)
WBC: 7.6 10*3/uL (ref 4.0–10.5)
nRBC: 0 % (ref 0.0–0.2)
nRBC: 0 % (ref 0.0–0.2)

## 2022-08-04 LAB — LIPASE, BLOOD: Lipase: 22 U/L (ref 11–51)

## 2022-08-04 SURGERY — LAPAROSCOPIC CHOLECYSTECTOMY
Anesthesia: General

## 2022-08-04 MED ORDER — PROPOFOL 10 MG/ML IV BOLUS
INTRAVENOUS | Status: AC
  Filled 2022-08-04: qty 20

## 2022-08-04 MED ORDER — FENTANYL CITRATE (PF) 100 MCG/2ML IJ SOLN
INTRAMUSCULAR | Status: AC
  Filled 2022-08-04: qty 2

## 2022-08-04 MED ORDER — ACETAMINOPHEN 10 MG/ML IV SOLN
INTRAVENOUS | Status: AC
  Filled 2022-08-04: qty 100

## 2022-08-04 MED ORDER — FENTANYL CITRATE (PF) 100 MCG/2ML IJ SOLN
25.0000 ug | INTRAMUSCULAR | Status: AC | PRN
  Administered 2022-08-04 (×6): 25 ug via INTRAVENOUS

## 2022-08-04 MED ORDER — PROPOFOL 10 MG/ML IV BOLUS
INTRAVENOUS | Status: DC | PRN
  Administered 2022-08-04: 140 mg via INTRAVENOUS

## 2022-08-04 MED ORDER — ONDANSETRON HCL 4 MG/2ML IJ SOLN
INTRAMUSCULAR | Status: DC | PRN
  Administered 2022-08-04: 4 mg via INTRAVENOUS

## 2022-08-04 MED ORDER — OXYCODONE HCL 5 MG/5ML PO SOLN: 5.0000 mg | Freq: Once | ORAL | Status: AC | PRN

## 2022-08-04 MED ORDER — ACETAMINOPHEN 10 MG/ML IV SOLN
1000.0000 mg | Freq: Once | INTRAVENOUS | Status: DC | PRN
  Administered 2022-08-04: 1000 mg via INTRAVENOUS

## 2022-08-04 MED ORDER — MIDAZOLAM HCL 2 MG/2ML IJ SOLN
INTRAMUSCULAR | Status: AC
  Filled 2022-08-04: qty 2

## 2022-08-04 MED ORDER — OXYCODONE HCL 5 MG PO TABS
ORAL_TABLET | ORAL | Status: AC
  Filled 2022-08-04: qty 1

## 2022-08-04 MED ORDER — 0.9 % SODIUM CHLORIDE (POUR BTL) OPTIME
TOPICAL | Status: DC | PRN
  Administered 2022-08-04: 1000 mL

## 2022-08-04 MED ORDER — OXYCODONE HCL 5 MG PO TABS
5.0000 mg | ORAL_TABLET | Freq: Once | ORAL | Status: AC | PRN
  Administered 2022-08-04: 5 mg via ORAL

## 2022-08-04 MED ORDER — LACTATED RINGERS IV SOLN: INTRAVENOUS | Status: DC | PRN

## 2022-08-04 MED ORDER — DEXAMETHASONE SODIUM PHOSPHATE 10 MG/ML IJ SOLN
INTRAMUSCULAR | Status: DC | PRN
  Administered 2022-08-04: 10 mg via INTRAVENOUS

## 2022-08-04 MED ORDER — BUPIVACAINE-EPINEPHRINE 0.25% -1:200000 IJ SOLN
INTRAMUSCULAR | Status: DC | PRN
  Administered 2022-08-04: 26 mL

## 2022-08-04 MED ORDER — SODIUM CHLORIDE 0.9 % IR SOLN
Status: DC | PRN
  Administered 2022-08-04: 1000 mL

## 2022-08-04 MED ORDER — SUGAMMADEX SODIUM 200 MG/2ML IV SOLN
INTRAVENOUS | Status: DC | PRN
  Administered 2022-08-04: 200 mg via INTRAVENOUS

## 2022-08-04 MED ORDER — STERILE WATER FOR INJECTION IJ SOLN
INTRAMUSCULAR | Status: DC | PRN
  Administered 2022-08-04: 1 mL via INTRAVENOUS

## 2022-08-04 MED ORDER — AMISULPRIDE (ANTIEMETIC) 5 MG/2ML IV SOLN: 10.0000 mg | Freq: Once | INTRAVENOUS | Status: DC | PRN

## 2022-08-04 MED ORDER — GLYCOPYRROLATE 0.2 MG/ML IJ SOLN
INTRAMUSCULAR | Status: DC | PRN
  Administered 2022-08-04: .2 mg via INTRAVENOUS

## 2022-08-04 MED ORDER — FENTANYL CITRATE PF 50 MCG/ML IJ SOSY: 25.0000 ug | PREFILLED_SYRINGE | INTRAMUSCULAR | Status: DC | PRN

## 2022-08-04 MED ORDER — FENTANYL CITRATE (PF) 250 MCG/5ML IJ SOLN
INTRAMUSCULAR | Status: DC | PRN
  Administered 2022-08-04 (×2): 25 ug via INTRAVENOUS
  Administered 2022-08-04: 100 ug via INTRAVENOUS

## 2022-08-04 MED ORDER — LIDOCAINE 2% (20 MG/ML) 5 ML SYRINGE
INTRAMUSCULAR | Status: DC | PRN
  Administered 2022-08-04: 60 mg via INTRAVENOUS

## 2022-08-04 MED ORDER — MIDAZOLAM HCL 2 MG/2ML IJ SOLN
INTRAMUSCULAR | Status: DC | PRN
  Administered 2022-08-04: 2 mg via INTRAVENOUS

## 2022-08-04 MED ORDER — OXYCODONE HCL 5 MG PO TABS
5.0000 mg | ORAL_TABLET | ORAL | Status: DC | PRN
  Administered 2022-08-04 – 2022-08-05 (×4): 5 mg via ORAL
  Filled 2022-08-04 (×4): qty 1

## 2022-08-04 MED ORDER — ROCURONIUM BROMIDE 10 MG/ML (PF) SYRINGE
PREFILLED_SYRINGE | INTRAVENOUS | Status: DC | PRN
  Administered 2022-08-04: 40 mg via INTRAVENOUS
  Administered 2022-08-04: 20 mg via INTRAVENOUS

## 2022-08-04 MED ORDER — FENTANYL CITRATE (PF) 250 MCG/5ML IJ SOLN
INTRAMUSCULAR | Status: AC
  Filled 2022-08-04: qty 5

## 2022-08-04 MED ORDER — BUPIVACAINE HCL (PF) 0.25 % IJ SOLN
INTRAMUSCULAR | Status: AC
  Filled 2022-08-04: qty 30

## 2022-08-04 SURGICAL SUPPLY — 39 items
APPLIER CLIP 5 13 M/L LIGAMAX5 (MISCELLANEOUS) ×1
BAG COUNTER SPONGE SURGICOUNT (BAG) ×1 IMPLANT
BLADE CLIPPER SURG (BLADE) IMPLANT
CANISTER SUCT 3000ML PPV (MISCELLANEOUS) ×1 IMPLANT
CHLORAPREP W/TINT 26 (MISCELLANEOUS) ×1 IMPLANT
CLIP APPLIE 5 13 M/L LIGAMAX5 (MISCELLANEOUS) ×1 IMPLANT
COVER SURGICAL LIGHT HANDLE (MISCELLANEOUS) ×1 IMPLANT
DERMABOND ADVANCED .7 DNX12 (GAUZE/BANDAGES/DRESSINGS) ×1 IMPLANT
DISSECTOR BLUNT TIP ENDO 5MM (MISCELLANEOUS) IMPLANT
ELECT REM PT RETURN 9FT ADLT (ELECTROSURGICAL) ×1
ELECTRODE REM PT RTRN 9FT ADLT (ELECTROSURGICAL) ×1 IMPLANT
GLOVE BIO SURGEON STRL SZ7.5 (GLOVE) ×1 IMPLANT
GLOVE INDICATOR 8.0 STRL GRN (GLOVE) ×1 IMPLANT
GOWN STRL REUS W/ TWL LRG LVL3 (GOWN DISPOSABLE) ×2 IMPLANT
GOWN STRL REUS W/ TWL XL LVL3 (GOWN DISPOSABLE) ×1 IMPLANT
GOWN STRL REUS W/TWL LRG LVL3 (GOWN DISPOSABLE) ×1
GOWN STRL REUS W/TWL XL LVL3 (GOWN DISPOSABLE) ×1
IRRIG SUCT STRYKERFLOW 2 WTIP (MISCELLANEOUS) ×1
IRRIGATION SUCT STRKRFLW 2 WTP (MISCELLANEOUS) ×1 IMPLANT
KIT BASIN OR (CUSTOM PROCEDURE TRAY) ×1 IMPLANT
KIT IMAGING PINPOINTPAQ (MISCELLANEOUS) IMPLANT
KIT TURNOVER KIT B (KITS) ×1 IMPLANT
NDL INSUFFLATION 14GA 120MM (NEEDLE) IMPLANT
NEEDLE INSUFFLATION 14GA 120MM (NEEDLE) ×1 IMPLANT
NS IRRIG 1000ML POUR BTL (IV SOLUTION) ×1 IMPLANT
PAD ARMBOARD 7.5X6 YLW CONV (MISCELLANEOUS) ×1 IMPLANT
PENCIL BUTTON HOLSTER BLD 10FT (ELECTRODE) ×1 IMPLANT
SCISSORS LAP 5X35 DISP (ENDOMECHANICALS) ×1 IMPLANT
SET TUBE SMOKE EVAC HIGH FLOW (TUBING) ×1 IMPLANT
SUT MNCRL AB 4-0 PS2 18 (SUTURE) ×1 IMPLANT
SYS BAG RETRIEVAL 10MM (BASKET) ×1
SYSTEM BAG RETRIEVAL 10MM (BASKET) IMPLANT
TOWEL GREEN STERILE FF (TOWEL DISPOSABLE) ×1 IMPLANT
TRAY LAPAROSCOPIC MC (CUSTOM PROCEDURE TRAY) ×1 IMPLANT
TROCAR ADV FIXATION 5X100MM (TROCAR) ×3 IMPLANT
TROCAR BALLN 12MMX100 BLUNT (TROCAR) ×1 IMPLANT
TROCAR XCEL BLADELESS 5X75MML (TROCAR) IMPLANT
WARMER LAPAROSCOPE (MISCELLANEOUS) ×1 IMPLANT
WATER STERILE IRR 1000ML POUR (IV SOLUTION) ×1 IMPLANT

## 2022-08-04 NOTE — Anesthesia Postprocedure Evaluation (Signed)
Anesthesia Post Note  Patient: Meredith Hamilton  Procedure(s) Performed: LAPAROSCOPIC CHOLECYSTECTOMY WITH ICG DYE     Patient location during evaluation: PACU Anesthesia Type: General Level of consciousness: awake Pain management: pain level controlled Vital Signs Assessment: post-procedure vital signs reviewed and stable Respiratory status: spontaneous breathing, nonlabored ventilation and respiratory function stable Cardiovascular status: blood pressure returned to baseline and stable Postop Assessment: no apparent nausea or vomiting Anesthetic complications: yes   Encounter Notable Events  Notable Event Outcome Phase Comment  Difficult to intubate - expected  In Recovery Filed from anesthesia note documentation.    Last Vitals:  Vitals:   08/04/22 1030 08/04/22 1058  BP: (!) 145/70 (!) 149/62  Pulse: 62 (!) 57  Resp: 17   Temp: 36.7 C 36.9 C  SpO2: 92% 94%    Last Pain:  Vitals:   08/04/22 1157  TempSrc:   PainSc: Asleep                 Linton Rump

## 2022-08-04 NOTE — Anesthesia Procedure Notes (Signed)
Procedure Name: Intubation Date/Time: 08/04/2022 7:45 AM  Performed by: Cheree Ditto, CRNAPre-anesthesia Checklist: Patient identified, Emergency Drugs available, Suction available and Patient being monitored Patient Re-evaluated:Patient Re-evaluated prior to induction Oxygen Delivery Method: Circle system utilized Preoxygenation: Pre-oxygenation with 100% oxygen Induction Type: IV induction Ventilation: Mask ventilation without difficulty Laryngoscope Size: Glidescope and 3 Grade View: Grade I Tube type: Oral Number of attempts: 1 Airway Equipment and Method: Stylet and Oral airway Placement Confirmation: ETT inserted through vocal cords under direct vision, positive ETCO2 and breath sounds checked- equal and bilateral Secured at: 22 cm Tube secured with: Tape Dental Injury: Teeth and Oropharynx as per pre-operative assessment  Difficulty Due To: Difficulty was anticipated Comments: Easy MV and glidescope

## 2022-08-04 NOTE — Op Note (Signed)
08/02/2022 - 08/04/2022 9:12 AM  PATIENT: Meredith Hamilton  72 y.o. female  Patient Care Team: Beryle Flock Marzella Schlein, MD as PCP - General (Family Medicine) Joie Bimler, DC (Chiropractic Medicine) Barkley Boards, NP (Nurse Practitioner) Trey Sailors, PA-C (Inactive) as Physician Assistant (Physician Assistant) Creig Hines, MD as Consulting Physician (Oncology)  PRE-OPERATIVE DIAGNOSIS: Choledocholithiasis  POST-OPERATIVE DIAGNOSIS: Choledocholithiasis, chronic cholecystitis  PROCEDURE: Laparoscopic cholecystectomy with ICG cholangiography  SURGEON: Marin Olp, MD  ASSISTANT: Berenda Morale, RNFA  ANESTHESIA: General endotracheal  EBL: 20 mL  DRAINS: None  SPECIMEN: Gallbladder  COUNTS: Sponge, needle and instrument counts were reported correct x2 at the conclusion of the operation  DISPOSITION: PACU in satisfactory condition  COMPLICATIONS: None  FINDINGS: Acute on chronic cholecystitis.  ICG cholangiography demonstrates uptake in the liver and excretion into the biliary tree.  Faint tracer was seen at the porta hepatis extending down through the hepatoduodenal ligament and opacifying the wall of the duodenum consistent with an at least patent biliary system.  No significant uptake into the gallbladder.  Critical view of safety achieved before clipping any structures.  DESCRIPTION:   The patient was identified & brought into the operating room.  She was then positioned supine on the OR table. SCDs were in place and active during the entire case.  She then underwent general endotracheal anesthesia. Pressure points were padded. Hair on the abdomen was clipped by the OR team. The abdomen was prepped and draped in the standard sterile fashion. Antibiotics were administered. A surgical timeout was performed and confirmed our plan.   A periumbilical incision was made. The umbilical stalk was grasped and retracted outwardly. The supraumbilical fascia was identified and  incised. The peritoneal cavity was gently entered bluntly. A purse-string 0 Vicryl suture was placed.  It was unclear if there actually adhesions at this location versus thickened peritoneum.  We opted to proceed with a Veress needle entry in the left upper quadrant Palmer's point.  An OG tube had already been placed by anesthesia and was confirmed to be to suction.  At Palmer's point, a stab incision is created.  The Veress needle was introduced into the peritoneal cavity on the first attempt.  Intraperitoneal location was confirmed with the aspiration and saline drop test.  Pneumoperitoneum established to 15 mmHg using CO2.  A 5 mm optical viewing trocar is then used to gain access to the peritoneal cavity under direct visualization.  The laparoscope was inserted and demonstrates no evidence of Veress needle or trocar site complication.  The abdomen is surveyed.  Across the right lower quadrant there are some adhesions containing bowel but this is well away from the umbilical trocar site.  Were then able to confirm safe entry for [facility] cannula.  The Hasson cannula was inserted into the peritoneal cavity. She was then positioned in reverse Trendelenburg with slight left side down. 3 additional 5mm trocars were placed along the right subcostal line - one 5mm port in mid subcostal region, another 5mm port in the right flank near the anterior axillary line, and a third 5mm port in the left subxiphoid region obliquely near the falciform ligament.  The liver and gallbladder were inspected.  The gallbladder has acute and chronic type adhesions.  These were able to be carefully swept away. The gallbladder fundus was grasped and elevated cephalad. An additional grasper was then placed on the infundibulum of the gallbladder and the infundibulum was retracted laterally. Staying high on the gallbladder, the peritoneum on both sides of  the gallbladder was opened with hook cautery. Gentle blunt dissection was then employed  with a Art gallery manager working down into [employer].  There is some fibrosis at this location consistent with prior/chronic cholecystitis.  The cystic duct was identified and carefully circumferentially dissected. The cystic artery was also identified and carefully circumferentially dissected. The space between the cystic artery and hepatocystic plate was developed such that a good view of the liver could be seen through a window medial to the cystic artery. The triangle of Calot had been cleared of all fibrofatty tissue. At this point, a critical view of safety was achieved and the only structures visualized was the skeletonized cystic duct laterally, the skeletonized cystic artery and the liver through the window medial to the artery. No posterior cystic artery was noted  At this point attention was turned to performing ICG cholangiography.  Under near-infrared light, tracer is visible within the hepatic parenchyma with excretion into the biliary system.  We are able to see tracer within the porta hepatis extending down through the hepatoduodenal ligament consistent with the trajectory of the common bile duct.  Our cystic duct is well away from this tubular structure and has been circumferentially dissected.  There is no significant opacification within the gallbladder.  There is some faint tracer seen through the wall of the duodenum consistent with a patent biliary system.  The cystic duct and artery were clipped with 2 clips on the patient side and 1 clip on the specimen side. The cystic duct and artery were then divided. The gallbladder was then freed from its remaining attachments to the liver using electrocautery and placed into an endocatch bag. The RUQ was gently irrigated with sterile saline. Hemostasis was then verified. The clips were in good position; the gallbladder fossa was dry. The rest of the abdomen was inspected no injury nor bleeding elsewhere was identified.  The endocatch bag  containing the gallbladder was then removed from the umbilical port site and passed off as specimen. The RUQ ports were removed under direct visualization and noted to be hemostatic. The umbilical fascia was then closed using the 0 Vicryl purse-string suture. The fascia was palpated and noted to be completely closed. The skin of all incision sites was approximated with 4-0 monocryl subcuticular suture and dermabond applied. She was then awakened from anesthesia, extubated, and transferred to a stretcher for transport to PACU in satisfactory condition.

## 2022-08-04 NOTE — Transfer of Care (Signed)
Immediate Anesthesia Transfer of Care Note  Patient: Meredith Hamilton  Procedure(s) Performed: LAPAROSCOPIC CHOLECYSTECTOMY WITH ICG DYE  Patient Location: PACU  Anesthesia Type:General  Level of Consciousness: awake, alert , and oriented  Airway & Oxygen Therapy: Patient Spontanous Breathing  Post-op Assessment: Report given to RN and Post -op Vital signs reviewed and stable  Post vital signs: Reviewed and stable  Last Vitals:  Vitals Value Taken Time  BP 164/67 08/04/22 0926  Temp    Pulse 68 08/04/22 0928  Resp 14 08/04/22 0928  SpO2 93 % 08/04/22 0928  Vitals shown include unvalidated device data.  Last Pain:  Vitals:   08/03/22 2020  TempSrc: Oral  PainSc:          Complications: No notable events documented.

## 2022-08-04 NOTE — Care Management CC44 (Cosign Needed)
Condition Code 44 Documentation Completed  Patient Details  Name: Meredith Hamilton MRN: [id number] Date of Birth: 1950/06/22   Condition Code 44 given:  Yes Patient signature on Condition Code 44 notice:  Yes Documentation of 2 MD's agreement:  Yes Code 44 added to claim:  Yes    Janae Bridgeman, RN 08/04/2022, 9:13 AM

## 2022-08-04 NOTE — Discharge Instructions (Signed)

## 2022-08-04 NOTE — Progress Notes (Signed)
Subjective No acute events. Recovering from ERCP uneventfully. States she is ready for surgery  Objective: Vital signs in last 24 hours: Temp:  [97.3 F (36.3 C)-98.4 F (36.9 C)] 98.4 F (36.9 C) (04/25 0526) Pulse Rate:  [48-57] 57 (04/24 2020) Resp:  [11-22] 17 (04/25 0526) BP: (119-149)/(58-85) 119/63 (04/25 0526) SpO2:  [92 %-100 %] 97 % (04/25 0526)    Intake/Output from previous day: 04/24 0701 - 04/25 0700 In: 800 [I.V.:800] Out: -  Intake/Output this shift: No intake/output data recorded.  Gen: NAD, comfortable CV: RRR Pulm: Normal work of breathing Abd: Soft, NT/ND  Ext: SCDs in place  Lab Results: CBC  Recent Labs    08/03/22 0429 08/04/22 0432  WBC 5.2 6.4  HGB 11.2* 11.5*  HCT 33.2* 33.6*  PLT 130* 147*   BMET Recent Labs    08/03/22 0429 08/04/22 0432  NA 139 140  K 3.2* 3.7  CL 110 109  CO2 21* 21*  GLUCOSE 85 116*  BUN 9 12  CREATININE 0.73 0.80  CALCIUM 8.2* 8.8*   PT/INR No results for input(s): "LABPROT", "INR" in the last 72 hours. ABG No results for input(s): "PHART", "HCO3" in the last 72 hours.  Invalid input(s): "PCO2", "PO2"  Studies/Results:  Anti-infectives: Anti-infectives (From admission, onward)    Start     Dose/Rate Route Frequency Ordered Stop   08/03/22 1200  ciprofloxacin (CIPRO) IVPB 400 mg  Status:  Discontinued        400 mg 200 mL/hr over 60 Minutes Intravenous  Once 08/02/22 1942 08/03/22 1608   08/02/22 2200  [MAR Hold]  cefTRIAXone (ROCEPHIN) 2 g in sodium chloride 0.9 % 100 mL IVPB        (MAR Hold since Thu 08/04/2022 at 0634.Hold Reason: Transfer to a Procedural area)   2 g 200 mL/hr over 30 Minutes Intravenous Every 24 hours 08/02/22 0827     08/02/22 1800  [MAR Hold]  metroNIDAZOLE (FLAGYL) IVPB 500 mg        (MAR Hold since Thu 08/04/2022 at 0634.Hold Reason: Transfer to a Procedural area)   500 mg 100 mL/hr over 60 Minutes Intravenous Every 12 hours 08/02/22 0827           Assessment/Plan: Patient Active Problem List   Diagnosis Date Noted   Choledocholithiasis with cholecystitis 08/02/2022   Elevated liver function tests 08/02/2022   Asymptomatic bradycardia 08/02/2022   Hypokalemia 08/02/2022   History of breast cancer 08/02/2022   Erythema nodosum 03/31/2022   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/31/2022   Vitamin B12 deficiency 10/07/2021   Dependent edema 10/07/2021   Vitamin D insufficiency 03/02/2020   Thyroid nodule 08/19/2019   Family history of breast cancer    Family history of throat cancer    Family history of lung cancer    Status post mastectomy, left 11/08/2017   Malignant neoplasm of overlapping sites of left breast in female, estrogen receptor positive 10/27/2017   Anxiety 07/22/2017   H/O Hashimoto thyroiditis 07/22/2017   Invasive ductal carcinoma of breast 07/22/2017   Essential (primary) hypertension 11/08/2004   Obesity 11/08/2004   -The anatomy and physiology of the hepatobiliary system was discussed with the patient. The pathophysiology of gallbladder disease was then reviewed as well. -The options for treatment were discussed including ongoing observation which may result in subsequent gallbladder complications (infection, pancreatitis, recurrent choledocholithiasis, etc), drainage procedures, and surgery - laparoscopic cholecystectomy with ICG cholangiography -The planned procedure, material risks (including, but not limited to, pain,  bleeding, infection, scarring, need for blood transfusion, damage to surrounding structures- blood vessels/nerves/viscus/organs, damage to bile duct, bile leak, conversion to a 'subtotal' cholecystectomy and general expectations therein, post-cholecystectomy diarrhea, potential need for additional procedures including EGD/ERCP, hernia, worsening of pre-existing medical conditions, pancreatitis, pneumonia, heart attack, stroke, death) benefits and alternatives to surgery were discussed at  length. We have noted a good probability that the procedure would help improve their symptoms. The patient's questions were answered to her satisfaction, she voiced understanding and elected to proceed with surgery. Additionally, we discussed typical postoperative expectations and the recovery process.   LOS: 1 day   I spent a total of 35 minutes in both face-to-face and non-face-to-face activities, excluding procedures performed, for this visit on the date of this encounter.  Marin Olp, MD [facility], [facility]

## 2022-08-04 NOTE — Care Management Obs Status (Cosign Needed)
MEDICARE OBSERVATION STATUS NOTIFICATION   Patient Details  Name: Meredith Hamilton MRN: [id number] Date of Birth: March 18, 1951   Medicare Observation Status Notification Given:  Yes    Janae Bridgeman, RN 08/04/2022, 9:13 AM

## 2022-08-04 NOTE — Progress Notes (Signed)
[facility]                                                                              Meredith Hamilton, is a 72 y.o. female, DOB - 1950-04-30, ZOX:[id number] Admit date - 08/02/2022    Outpatient Primary MD for the patient is Beryle Flock, Marzella Schlein, MD  LOS - 1  days  Chief Complaint  Patient presents with   Abdominal Pain       Brief summary   Patient is a 72 year old female with HTN, Hashimoto's disease, breast CA presented with right upper quadrant abdominal pain over the last 3 weeks.  Described as crampy, normally occurring after eating meals hence decreased p.o. intake.  Endorsed nausea but no fevers or chills. In ED at [facility], noted to be afebrile, RR 13-25, pulse 53-98, BP stable. K2.8, creatinine 0.93, lipase 28, AST 132, ALT 108, total bilirubin 2.2.  RUQ [place] noted a subcentimeter gallstone versus benign gallbladder polyp and a distal CBD stone in the setting of positive sonographic Murphy sign suggestive of acute cholecystitis. Patient was transferred to [facility] and admitted for further workup.    Assessment & Plan    Principal Problem:   Choledocholithiasis with acute cholecystitis, acute transaminitis -Presented with RUQ abdominal pain with transaminitis.  - RUQ [place] + gallstones, distal CBD stone pericholecystic fluid, positive Murphy sign concerning for acute cholecystitis -GI, general surgery consulted -ERCP 4/24: entire biliary tree was mildly dilated, no stones retrieved - Underwent laparoscopic cholecystectomy today -Continue pain control, IV antibiotics, resume diet as tolerated, gentle hydration - follow CMET in am  Active problems Asymptomatic bradycardia -HR in 40s to 60s, asymptomatic -Not on any rate controlling meds, continue telemetry -No acute issues    Hypokalemia -Hold HCTZ -Replaced potassium    History of breast cancer -Prior history of invasive ductal carcinoma of the breast status post left mastectomy.   -Continue Arimidex once  able  Obesity Estimated body mass index is 30.25 kg/m as calculated from the following:   Height as of this encounter:  (1.676 m).   Weight as of this encounter: 85 kg.  Code Status: Full code DVT Prophylaxis:  Place and maintain sequential compression device Start: 08/04/22 1059   Level of Care: Level of care: Telemetry Medical Family Communication: Updated patient Disposition Plan:      Remains inpatient appropriate: Likely DC home in a.m. if tolerating diet   Procedures:  ERCP 4/24 Laparoscopic cholecystectomy 4/25  Consultants:   GI General surgery  Antimicrobials:   Anti-infectives (From admission, onward)    Start     Dose/Rate Route Frequency Ordered Stop   08/03/22 1200  ciprofloxacin (CIPRO) IVPB 400 mg  Status:  Discontinued        400 mg 200 mL/hr over 60 Minutes Intravenous  Once 08/02/22 1942 08/03/22 1608   08/02/22 2200  cefTRIAXone (ROCEPHIN) 2 g in sodium chloride 0.9 % 100 mL IVPB        2 g 200 mL/hr over 30 Minutes Intravenous Every 24 hours 08/02/22 0827     08/02/22 1800  metroNIDAZOLE (FLAGYL) IVPB 500 mg  500 mg 100 mL/hr over 60 Minutes Intravenous Every 12 hours 08/02/22 0827            Medications  fentaNYL       fentaNYL       indomethacin  100 mg Rectal Once   oxyCODONE       sodium chloride flush  3 mL Intravenous Q12H      Subjective:   Meredith Hamilton was seen and examined today.  Seen after surgery, feeling groggy and abdominal pain 5/10.  Wants to sleep.  No active nausea or vomiting, fevers or chills.  No chest pain or shortness of breath.    Objective:   Vitals:   08/04/22 1000 08/04/22 1015 08/04/22 1030 08/04/22 1058  BP: (!) 145/65 (!) 137/58 (!) 145/70 (!) 149/62  Pulse: 63 (!) 58 62 (!) 57  Resp: 16 16 17    Temp:   98 F (36.7 C) 98.4 F (36.9 C)  TempSrc:      SpO2: 92% 93% 92% 94%  Weight:      Height:        Intake/Output Summary (Last 24 hours) at 08/04/2022 1344 Last data filed at 08/04/2022  0913 Gross per 24 hour  Intake 1800 ml  Output 20 ml  Net 1780 ml     Wt Readings from Last 3 Encounters:  08/02/22 85 kg  08/02/22 85.7 kg  07/05/22 92.5 kg   Physical Exam General: Sleepy, easily arousable, somewhat uncomfortable, NAD Cardiovascular: S1 S2 clear, RRR.  Respiratory: CTAB, no wheezing Gastrointestinal: Soft, ND, NBS, incisions CDI  Ext: no pedal edema bilaterally Neuro: no new deficits Psych: Normal affect     Data Reviewed:  I have personally reviewed following labs    CBC Lab Results  Component Value Date   WBC 7.6 08/04/2022   RBC 3.60 (L) 08/04/2022   HGB 11.2 (L) 08/04/2022   HCT 34.6 (L) 08/04/2022   MCV 96.1 08/04/2022   MCH 31.1 08/04/2022   PLT 146 (L) 08/04/2022   MCHC 32.4 08/04/2022   RDW 12.9 08/04/2022   LYMPHSABS 1.9 08/02/2022   MONOABS 0.5 08/02/2022   EOSABS 0.1 08/02/2022   BASOSABS 0.0 08/02/2022     Last metabolic panel Lab Results  Component Value Date   NA 140 08/04/2022   K 3.7 08/04/2022   CL 109 08/04/2022   CO2 21 (L) 08/04/2022   BUN 12 08/04/2022   CREATININE 0.80 08/04/2022   GLUCOSE 116 (H) 08/04/2022   GFRNONAA >60 08/04/2022   GFRAA 93 02/27/2020   CALCIUM 8.8 (L) 08/04/2022   PHOS 3.9 01/16/2018   PROT 6.1 (L) 08/04/2022   ALBUMIN 3.2 (L) 08/04/2022   LABGLOB 2.5 10/07/2021   AGRATIO 2.0 10/07/2021   BILITOT 2.1 (H) 08/04/2022   ALKPHOS 96 08/04/2022   AST 132 (H) 08/04/2022   ALT 137 (H) 08/04/2022   ANIONGAP 10 08/04/2022    CBG (last 3)  No results for input(s): "GLUCAP" in the last 72 hours.    Coagulation Profile: No results for input(s): "INR", "PROTIME" in the last 168 hours.   Radiology Studies: I have personally reviewed the imaging studies  DG ERCP  Result Date: 08/03/2022 CLINICAL DATA:  ERCP for choledocholithiasis EXAM: ERCP TECHNIQUE: Multiple spot images obtained with the fluoroscopic device and submitted for interpretation post-procedure. FLUOROSCOPY TIME: FLUOROSCOPY  TIME 43 seconds (20.6 mGy) COMPARISON:  Right upper quadrant abdominal ultrasound-08/02/2022 CT abdomen and pelvis-03/29/2018 FINDINGS: Seven spot intraoperative fluoroscopic images the right upper abdominal quadrant during  ERCP are provided for review Initial image demonstrates an ERCP probe overlying the right upper abdominal quadrant. There is a punctate opacity overlying the right upper abdominal quadrant measuring a proximally 0.9 cm which is nonspecific though potentially representative of a renal stone Subsequent images demonstrate selective cannulation and opacification of the common bile duct which appears mildly dilated. There are two persistent nonocclusive filling defects within the CBD worrisome for choledocholithiasis (representative images 3 and 4). Subsequent images demonstrate insufflation of a balloon within the central aspect of the CBD with subsequent biliary sweeping presumed stone extraction and sphincterotomy. There is minimal opacification of the intrahepatic biliary tree which appears mildly dilated. There is opacification of the cystic duct with passage of contrast to the level of the gallbladder. IMPRESSION: 1. ERCP with findings of choledocholithiasis with subsequent biliary sweeping and presumed stone extraction and sphincterotomy. 2. Punctate (0.9 cm) opacity overlying the right upper abdominal quadrant favored to represent the renal stone demonstrated on remote abdominal CT performed in 2019. These images were submitted for radiologic interpretation only. Please see the procedural report for the amount of contrast and the fluoroscopy time utilized. Electronically Signed   By: Simonne Come M.D.   On: 08/03/2022 15:14       Aunesty Tyson M.D. [facility] 08/04/2022, 1:44 PM  Available via Epic secure chat 7am-7pm After 7 pm, please refer to night coverage provider listed on amion.

## 2022-08-05 ENCOUNTER — Encounter (HOSPITAL_COMMUNITY): Payer: Self-pay | Admitting: Surgery

## 2022-08-05 DIAGNOSIS — R7989 Other specified abnormal findings of blood chemistry: Secondary | ICD-10-CM | POA: Diagnosis not present

## 2022-08-05 DIAGNOSIS — E876 Hypokalemia: Secondary | ICD-10-CM | POA: Diagnosis not present

## 2022-08-05 DIAGNOSIS — K8040 Calculus of bile duct with cholecystitis, unspecified, without obstruction: Secondary | ICD-10-CM | POA: Diagnosis not present

## 2022-08-05 DIAGNOSIS — R001 Bradycardia, unspecified: Secondary | ICD-10-CM | POA: Diagnosis not present

## 2022-08-05 LAB — CBC
HCT: 33.5 % — ABNORMAL LOW (ref 36.0–46.0)
Hemoglobin: 11.2 g/dL — ABNORMAL LOW (ref 12.0–15.0)
MCH: 31.8 pg (ref 26.0–34.0)
MCHC: 33.4 g/dL (ref 30.0–36.0)
MCV: 95.2 fL (ref 80.0–100.0)
Platelets: 159 10*3/uL (ref 150–400)
RBC: 3.52 MIL/uL — ABNORMAL LOW (ref 3.87–5.11)
RDW: 13.2 % (ref 11.5–15.5)
WBC: 8.3 10*3/uL (ref 4.0–10.5)
nRBC: 0 % (ref 0.0–0.2)

## 2022-08-05 LAB — COMPREHENSIVE METABOLIC PANEL
ALT: 114 U/L — ABNORMAL HIGH (ref 0–44)
AST: 102 U/L — ABNORMAL HIGH (ref 15–41)
Albumin: 3.1 g/dL — ABNORMAL LOW (ref 3.5–5.0)
Alkaline Phosphatase: 82 U/L (ref 38–126)
Anion gap: 11 (ref 5–15)
BUN: 11 mg/dL (ref 8–23)
CO2: 22 mmol/L (ref 22–32)
Calcium: 8.8 mg/dL — ABNORMAL LOW (ref 8.9–10.3)
Chloride: 105 mmol/L (ref 98–111)
Creatinine, Ser: 0.98 mg/dL (ref 0.44–1.00)
GFR, Estimated: >60 mL/min (ref >60)
Glucose, Bld: 94 mg/dL (ref 70–99)
Potassium: 3.2 mmol/L — ABNORMAL LOW (ref 3.5–5.1)
Sodium: 138 mmol/L (ref 135–145)
Total Bilirubin: 1.3 mg/dL — ABNORMAL HIGH (ref 0.3–1.2)
Total Protein: 5.9 g/dL — ABNORMAL LOW (ref 6.5–8.1)

## 2022-08-05 LAB — SURGICAL PATHOLOGY

## 2022-08-05 MED ORDER — POTASSIUM CHLORIDE CRYS ER 20 MEQ PO TBCR: 20.0000 meq | EXTENDED_RELEASE_TABLET | Freq: Every day | ORAL | 0 refills | Status: AC

## 2022-08-05 MED ORDER — POTASSIUM CHLORIDE CRYS ER 20 MEQ PO TBCR
40.0000 meq | EXTENDED_RELEASE_TABLET | Freq: Once | ORAL | Status: AC
  Administered 2022-08-05: 40 meq via ORAL
  Filled 2022-08-05: qty 2

## 2022-08-05 MED ORDER — PHENYLEPHRINE HCL-NACL 20-0.9 MG/250ML-% IV SOLN
INTRAVENOUS | Status: AC
  Filled 2022-08-05: qty 1250

## 2022-08-05 MED ORDER — OXYCODONE HCL 5 MG PO TABS: 5.0000 mg | ORAL_TABLET | Freq: Four times a day (QID) | ORAL | 0 refills | Status: DC | PRN

## 2022-08-05 NOTE — Discharge Summary (Signed)
Physician Discharge Summary  Lyrica Mcclarty WUJ:[id number] DOB: 11-Dec-1950 DOA: 08/02/2022  PCP: Erasmo Downer, MD  Admit date: 08/02/2022 Discharge date: 08/05/2022  Admitted From: Home  Discharge disposition: Home   Recommendations for Outpatient Follow-Up:   Follow up with your primary care provider in one week.  Check CBC, CMP, magnesium in the next visit Follow-up with general surgery as has been scheduled.   Discharge Diagnosis:   Principal Problem:   Choledocholithiasis with cholecystitis Active Problems:   Elevated liver function tests   Asymptomatic bradycardia   Hypokalemia   History of breast cancer Hypokalemia  Discharge Condition: Improved.  Diet recommendation: Low sodium, heart healthy.   Wound care: None.  Code status: Full.   History of Present Illness:   Patient is a 72 year old female with HTN, Hashimoto's disease, breast CA presented with right upper quadrant abdominal pain over the last 3 weeks with decreased oral intake nausea but no fever or chills..In ED patient was afebrile.  Potassium was low at 2.8.  Total bilirubin elevated at 2.2 with AST of 132 and ALT of 108 respectively.  RUQ [place] noted a subcentimeter gallstone versus benign gallbladder polyp and a distal CBD stone in the setting of positive sonographic Murphy sign suggestive of acute cholecystitis.  Patient was then transferred to [facility] for further evaluation and treatment.  Hospital Course:   Following conditions were addressed during hospitalization as listed below,  Choledocholithiasis with acute cholecystitis, acute transaminitis Patient presented with right upper quadrant pain with elevated LFTs. RUQ [place] + gallstones, distal CBD stone pericholecystic fluid, positive Murphy sign concerning for acute cholecystitis.  GI and general surgery was consulted and patient underwent ERCP on 08/03/2022 with no stones but biliary dilatation so underwent laparoscopic  cholecystectomy 08/04/2022.  At this time patient has been seen by general surgery and is stable for disposition home.  Bilirubin improving.  Asymptomatic bradycardia Asymptomatic.   Hypokalemia Will continue potassium supplements on discharge.  Check BMP with PCP in the next few days.  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  Continue Arimidex   Obesity Body mass index is 30.25 kg/m.  Would benefit from weight loss as outpatient   Disposition.  At this time, patient is stable for disposition home with outpatient PCP and general surgery follow-up.  Medical Consultants:   General surgery GI Procedures:    ERCP Laparoscopic cholecystectomy Subjective:   Today, patient was seen and examined at bedside.  Denies any nausea vomiting fever abdominal pain or chest pain.  Was able to ambulate.  Discharge Exam:   Vitals:   08/05/22 0700 08/05/22 0822  BP: 122/66 137/64  Pulse: (!) 57 (!) 48  Resp: 16 18  Temp:  97.8 F (36.6 C)  SpO2:  96%   Vitals:   08/04/22 2222 08/05/22 0509 08/05/22 0700 08/05/22 0822  BP: (!) 136/56 120/88 122/66 137/64  Pulse: (!) 55 (!) 57 (!) 57 (!) 48  Resp: 16 16 16 18   Temp: 98.3 F (36.8 C) 97.8 F (36.6 C)  97.8 F (36.6 C)  TempSrc: Oral Oral  Oral  SpO2: 95% 95%  96%  Weight:      Height:       Body mass index is 30.25 kg/m.  General: Alert awake, not in obvious distress, obese HENT: pupils equally reacting to light,  No scleral pallor or icterus noted. Oral mucosa is moist.  Chest:  Diminished breath sounds bilaterally. No  crackles or wheezes.  CVS: S1 &S2 heard. No murmur.  Regular rate and rhythm. Abdomen: Soft, nontender, nondistended.  Bowel sounds are heard.  Laparoscopic scars noted.   Extremities: No cyanosis, clubbing or edema.  Peripheral pulses are palpable. Psych: Alert, awake and oriented, normal mood CNS:  No cranial nerve deficits.  Power equal  in all extremities.   Skin: Warm and dry.  No rashes noted.  The results of significant diagnostics from this hospitalization (including imaging, microbiology, ancillary and laboratory) are listed below for reference.     Diagnostic Studies:   DG ERCP  Result Date: Aug 22, 2022 CLINICAL DATA:  ERCP for choledocholithiasis EXAM: ERCP TECHNIQUE: Multiple spot images obtained with the fluoroscopic device and submitted for interpretation post-procedure. FLUOROSCOPY TIME: FLUOROSCOPY TIME 43 seconds (20.6 mGy) COMPARISON:  Right upper quadrant abdominal ultrasound-08/02/2022 CT abdomen and pelvis-03/29/2018 FINDINGS: Seven spot intraoperative fluoroscopic images the right upper abdominal quadrant during ERCP are provided for review Initial image demonstrates an ERCP probe overlying the right upper abdominal quadrant. There is a punctate opacity overlying the right upper abdominal quadrant measuring a proximally 0.9 cm which is nonspecific though potentially representative of a renal stone Subsequent images demonstrate selective cannulation and opacification of the common bile duct which appears mildly dilated. There are two persistent nonocclusive filling defects within the CBD worrisome for choledocholithiasis (representative images 3 and 4). Subsequent images demonstrate insufflation of a balloon within the central aspect of the CBD with subsequent biliary sweeping presumed stone extraction and sphincterotomy. There is minimal opacification of the intrahepatic biliary tree which appears mildly dilated. There is opacification of the cystic duct with passage of contrast to the level of the gallbladder. IMPRESSION: 1. ERCP with findings of choledocholithiasis with subsequent biliary sweeping and presumed stone extraction and sphincterotomy. 2. Punctate (0.9 cm) opacity overlying the right upper abdominal quadrant favored to represent the renal stone demonstrated on remote abdominal CT performed in 2019. These  images were submitted for radiologic interpretation only. Please see the procedural report for the amount of contrast and the fluoroscopy time utilized. Electronically Signed   By: Simonne Come M.D.   On: 08-22-2022 15:14   US ABDOMEN LIMITED RUQ (LIVER/GB)  Result Date: 08/02/2022 CLINICAL DATA:  Intermittent abdominal pain. EXAM: ULTRASOUND ABDOMEN LIMITED RIGHT UPPER QUADRANT COMPARISON:  None Available. FINDINGS: Gallbladder: A 4.8 mm gallstone versus gallbladder polyp is seen within the dependent portion of the gallbladder lumen. The gallbladder wall measures 3.0 mm in thickness. Pericholecystic fluid is seen. A positive sonographic Eulah Pont sign is noted by sonographer. Common bile duct: Diameter: 7.9 mm with a 5.8 mm shadowing distal common bile duct stone. Liver: No focal lesion identified. Within normal limits in parenchymal echogenicity. Portal vein is patent on color Doppler imaging with normal direction of blood flow towards the liver. Other: A 12 mm renal calculus is seen within the lower pole of the right kidney. IMPRESSION: Subcentimeter gallstone versus benign gallbladder polyp and distal common bile duct stone, in the setting of a positive sonographic Murphy sign, suggestive of acute cholecystitis. Electronically Signed   By: Aram Candela M.D.   On: 08/02/2022 02:42     Labs:   Basic Metabolic Panel: Recent Labs  Lab 08/02/22 0019 08/02/22 1251 2022-08-22 0429 08/04/22 0432 08/04/22 1226 08/05/22 0740  NA 140  --  139 140 136 138  K 2.8*  --  3.2* 3.7 3.1* 3.2*  CL 104  --  110 109 105 105  CO2 22  --  21* 21*  21* 22  GLUCOSE 154*  --  85 116* 141* 94  BUN 25*  --  9 12 7* 11  CREATININE 0.93  --  0.73 0.80 0.65 0.98  CALCIUM 9.1  --  8.2* 8.8* 8.5* 8.8*  MG 1.9 1.5*  --   --   --   --    GFR Estimated Creatinine Clearance: 55.3 mL/min (by C-G formula based on SCr of 0.98 mg/dL). Liver Function Tests: Recent Labs  Lab 08/02/22 0019 08/03/22 0429 08/04/22 0432  08/04/22 1226 08/05/22 0740  AST 132* 162* 132* 149* 102*  ALT 108* 145* 137* 138* 114*  ALKPHOS 117 95 96 99 82  BILITOT 2.2* 5.6* 2.1* 2.1* 1.3*  PROT 7.3 5.6* 6.1* 6.0* 5.9*  ALBUMIN 4.3 2.9* 3.2* 3.1* 3.1*   Recent Labs  Lab 08/02/22 0019 08/04/22 1226  LIPASE 28 22   No results for input(s): "AMMONIA" in the last 168 hours. Coagulation profile No results for input(s): "INR", "PROTIME" in the last 168 hours.  CBC: Recent Labs  Lab 08/02/22 0019 08/03/22 0429 08/04/22 0432 08/04/22 1226 08/05/22 0740  WBC 8.5 5.2 6.4 7.6 8.3  NEUTROABS 5.9  --   --   --   --   HGB 12.4 11.2* 11.5* 11.2* 11.2*  HCT 37.7 33.2* 33.6* 34.6* 33.5*  MCV 95.7 95.7 93.1 96.1 95.2  PLT 195 130* 147* 146* 159   Cardiac Enzymes: No results for input(s): "CKTOTAL", "CKMB", "CKMBINDEX", "TROPONINI" in the last 168 hours. BNP: Invalid input(s): "POCBNP" CBG: No results for input(s): "GLUCAP" in the last 168 hours. D-Dimer No results for input(s): "DDIMER" in the last 72 hours. Hgb A1c No results for input(s): "HGBA1C" in the last 72 hours. Lipid Profile No results for input(s): "CHOL", "HDL", "LDLCALC", "TRIG", "CHOLHDL", "LDLDIRECT" in the last 72 hours. Thyroid function studies No results for input(s): "TSH", "T4TOTAL", "T3FREE", "THYROIDAB" in the last 72 hours.  Invalid input(s): "FREET3"  Anemia work up No results for input(s): "VITAMINB12", "FOLATE", "FERRITIN", "TIBC", "IRON", "RETICCTPCT" in the last 72 hours. Microbiology Recent Results (from the past 240 hour(s))  MRSA Next Gen by PCR, Nasal     Status: None   Collection Time: 08/03/22  6:00 AM   Specimen: Nasal Mucosa; Nasal Swab  Result Value Ref Range Status   MRSA by PCR Next Gen NOT DETECTED NOT DETECTED Final    Comment: (NOTE) The GeneXpert MRSA Assay (FDA approved for NASAL specimens only), is one component of a comprehensive MRSA colonization surveillance program. It is not intended to diagnose MRSA infection  nor to guide or monitor treatment for MRSA infections. Test performance is not FDA approved in patients less than 39 years old. Performed at [facility], [address] [place], [place] [address]      Discharge Instructions:   Discharge Instructions     Call MD for:  persistant nausea and vomiting   Complete by: As directed    Call MD for:  severe uncontrolled pain   Complete by: As directed    Call MD for:  temperature >100.4   Complete by: As directed    Diet - low sodium heart healthy   Complete by: As directed    Discharge instructions   Complete by: As directed    Follow-up with your primary care provider in 1 week.  Check blood work at that time.  Follow-up with general surgery as has been scheduled on 08/30/2022.  Seek medical attention for worsening symptoms.   Increase  activity slowly   Complete by: As directed       Allergies as of 08/05/2022       Reactions   Other Rash   Sulfa Antibiotics Swelling   Tramadol Other (See Comments)   Other reaction(s): Hallucination        Medication List     TAKE these medications    anastrozole 1 MG tablet Commonly known as: ARIMIDEX TAKE 1 TABLET(1 MG) BY MOUTH DAILY What changed: See the new instructions.   BLACK COHOSH PO Take by mouth. 2 Tsp daily   CAL-MAG PO Take by mouth.   hydrochlorothiazide 12.5 MG tablet Commonly known as: HYDRODIURIL Take 1 tablet (12.5 mg total) by mouth daily.   naproxen 500 MG tablet Commonly known as: NAPROSYN TAKE 1 TABLET(500 MG) BY MOUTH TWICE DAILY WITH A MEAL   OVER THE COUNTER MEDICATION Take 4 Scoops by mouth daily. Pecta Sol-C (Mod. Citrus Pectin)   OVER THE COUNTER MEDICATION Take 2 Scoops by mouth daily. Fermented Mushroom Blend   OVER THE COUNTER MEDICATION Take 6 capsules by mouth daily. Cruciferous Complete   OVER THE COUNTER MEDICATION Nitric Balance 4 tsp daily as needed   oxyCODONE 5 MG immediate release tablet Commonly known as: Oxy  IR/ROXICODONE Take 1 tablet (5 mg total) by mouth every 6 (six) hours as needed for moderate pain or severe pain.   potassium chloride SA 20 MEQ tablet Commonly known as: KLOR-CON M Take 1 tablet (20 mEq total) by mouth daily for 5 days.   triamcinolone ointment 0.5 % Commonly known as: KENALOG Apply 1 application. topically 2 (two) times daily.   UNABLE TO FIND Take 10 drops by mouth daily. Med Name: Liquid Vitamin A   UNABLE TO FIND Med Name: Hydrex 2 vials daily   UNABLE TO FIND Med Name: Adrena Calm 1/4 pump daily   UNABLE TO FIND Med Name: BIOST skeletal and cellular Health 6 capsules daily   UNABLE TO FIND Med Name: A-C Carbamide 3 daily   UNABLE TO FIND Med Name: Derma Co 1 daily   Vitamin D3 Liqd Take 6 drops by mouth daily.        Follow-up Information     Maczis, Hedda Slade, [place]. Go on 08/30/2022.   Specialty: General Surgery Why: 9:45 AM. Please arrive 30 min prior to appointment time to check in. Contact information: [address] [place] [address] [facility] [place] [address] [phone]         Erasmo Downer, MD Follow up in 1 week(s).   Specialty: Family Medicine Contact information: [address] [place] [address] [phone]                  Time coordinating discharge: 39 minutes  Signed:  Frannie Shedrick  [facility] 08/05/2022, 2:48 PM

## 2022-08-05 NOTE — Progress Notes (Signed)
  Subjective No acute events. Feeling well. Pain controlled. No n/v. Tolerating diet without difficulty  Objective: Vital signs in last 24 hours: Temp:  [97.8 F (36.6 C)-98.4 F (36.9 C)] 97.8 F (36.6 C) (04/26 0822) Pulse Rate:  [48-72] 48 (04/26 0822) Resp:  [16-21] 18 (04/26 0822) BP: (120-164)/(56-99) 137/64 (04/26 0822) SpO2:  [92 %-96 %] 96 % (04/26 0822)    Intake/Output from previous day: 04/25 0701 - 04/26 0700 In: 1000 [I.V.:1000] Out: 20 [Blood:20] Intake/Output this shift: Total I/O In: 1348 [I.V.:760.2; IV Piggyback:587.9] Out: -   Gen: NAD, comfortable, anicteric sclera CV: RRR Pulm: Normal work of breathing Abd: Soft, approp incisional soreness, nondistended. Ext: SCDs in place  Lab Results: CBC  Recent Labs    08/04/22 1226 08/05/22 0740  WBC 7.6 8.3  HGB 11.2* 11.2*  HCT 34.6* 33.5*  PLT 146* 159   BMET Recent Labs    08/04/22 1226 08/05/22 0740  NA 136 138  K 3.1* 3.2*  CL 105 105  CO2 21* 22  GLUCOSE 141* 94  BUN 7* 11  CREATININE 0.65 0.98  CALCIUM 8.5* 8.8*   PT/INR No results for input(s): "LABPROT", "INR" in the last 72 hours. ABG No results for input(s): "PHART", "HCO3" in the last 72 hours.  Invalid input(s): "PCO2", "PO2"  Studies/Results:  Anti-infectives: Anti-infectives (From admission, onward)    Start     Dose/Rate Route Frequency Ordered Stop   08/03/22 1200  ciprofloxacin (CIPRO) IVPB 400 mg  Status:  Discontinued        400 mg 200 mL/hr over 60 Minutes Intravenous  Once 08/02/22 1942 08/03/22 1608   08/02/22 2200  cefTRIAXone (ROCEPHIN) 2 g in sodium chloride 0.9 % 100 mL IVPB        2 g 200 mL/hr over 30 Minutes Intravenous Every 24 hours 08/02/22 0827     08/02/22 1800  metroNIDAZOLE (FLAGYL) IVPB 500 mg        500 mg 100 mL/hr over 60 Minutes Intravenous Every 12 hours 08/02/22 0827          Assessment/Plan: Patient Active Problem List   Diagnosis Date Noted   Choledocholithiasis with  cholecystitis 08/02/2022   Elevated liver function tests 08/02/2022   Asymptomatic bradycardia 08/02/2022   Hypokalemia 08/02/2022   History of breast cancer 08/02/2022   Erythema nodosum 03/31/2022   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/31/2022   Vitamin B12 deficiency 10/07/2021   Dependent edema 10/07/2021   Vitamin D insufficiency 03/02/2020   Thyroid nodule 08/19/2019   Family history of breast cancer    Family history of throat cancer    Family history of lung cancer    Status post mastectomy, left 11/08/2017   Malignant neoplasm of overlapping sites of left breast in female, estrogen receptor positive (HCC) 10/27/2017   Anxiety 07/22/2017   H/O Hashimoto thyroiditis 07/22/2017   Invasive ductal carcinoma of breast (HCC) 07/22/2017   Essential (primary) hypertension 11/08/2004   Obesity 11/08/2004   s/p Procedure(s): LAPAROSCOPIC CHOLECYSTECTOMY WITH ICG DYE 08/04/2022  Doing well Diet as tolerated LFT better, bilirubin almost now normal  Ok for d/c from surgical perspective   LOS: 1 day    Marin Olp, MD [facility], [facility]

## 2022-08-07 ENCOUNTER — Encounter (HOSPITAL_COMMUNITY): Payer: Self-pay | Admitting: Internal Medicine

## 2022-08-08 ENCOUNTER — Telehealth: Payer: Self-pay

## 2022-08-08 NOTE — Transitions of Care (Post Inpatient/ED Visit) (Signed)
   08/08/2022  Name: Sukanya Goldblatt MRN: [id number] DOB: Aug 24, 1950  Today's TOC FU Call Status: Today's TOC FU Call Status:: Successful TOC FU Call Competed TOC FU Call Complete Date: 08/08/22  Transition Care Management Follow-up Telephone Call Date of Discharge: 08/05/22 Discharge Facility: [facility]) Type of Discharge: Inpatient Admission Primary Inpatient Discharge Diagnosis:: cholecystitis How have you been since you were released from the hospital?: Better Any questions or concerns?: No  Items Reviewed: Did you receive and understand the discharge instructions provided?: Yes Medications obtained and verified?: Yes (Medications Reviewed) Any new allergies since your discharge?: No Dietary orders reviewed?: Yes Do you have support at home?: Yes People in Home: child(ren), adult  Home Care and Equipment/Supplies: Were Home Health Services Ordered?: NA Any new equipment or medical supplies ordered?: NA  Functional Questionnaire: Do you need assistance with bathing/showering or dressing?: No Do you need assistance with meal preparation?: No Do you need assistance with eating?: No Do you have difficulty maintaining continence: No Do you need assistance with getting out of bed/getting out of a chair/moving?: No Do you have difficulty managing or taking your medications?: No  Follow up appointments reviewed: PCP Follow-up appointment confirmed?: Yes Date of PCP follow-up appointment?: 08/09/22 Follow-up Provider: [facility] Follow-up appointment confirmed?: Yes Date of Specialist follow-up appointment?: 08/30/22 Follow-Up Specialty Provider:: Dr Lavell Anchors Do you need transportation to your follow-up appointment?: No Do you understand care options if your condition(s) worsen?: Yes-patient verbalized understanding    SIGNATURE Karena Addison, LPN [facility] Nurse Health Advisor Direct Dial [phone]

## 2022-08-09 ENCOUNTER — Ambulatory Visit (INDEPENDENT_AMBULATORY_CARE_PROVIDER_SITE_OTHER): Payer: Medicare HMO | Admitting: Family Medicine

## 2022-08-09 ENCOUNTER — Encounter: Payer: Self-pay | Admitting: Family Medicine

## 2022-08-09 VITALS — BP 138/83 | HR 50 | Temp 98.2°F | Resp 12 | Wt 185.5 lb

## 2022-08-09 DIAGNOSIS — R001 Bradycardia, unspecified: Secondary | ICD-10-CM | POA: Diagnosis not present

## 2022-08-09 DIAGNOSIS — E876 Hypokalemia: Secondary | ICD-10-CM | POA: Diagnosis not present

## 2022-08-09 DIAGNOSIS — D649 Anemia, unspecified: Secondary | ICD-10-CM | POA: Insufficient documentation

## 2022-08-09 DIAGNOSIS — R7989 Other specified abnormal findings of blood chemistry: Secondary | ICD-10-CM | POA: Diagnosis not present

## 2022-08-09 DIAGNOSIS — K5900 Constipation, unspecified: Secondary | ICD-10-CM

## 2022-08-09 DIAGNOSIS — K8040 Calculus of bile duct with cholecystitis, unspecified, without obstruction: Secondary | ICD-10-CM

## 2022-08-09 NOTE — Assessment & Plan Note (Signed)
Secondary to choledocholithiasis Recheck LFTs today

## 2022-08-09 NOTE — Assessment & Plan Note (Signed)
Status post ERCP and cholecystectomy Patient is doing well and pain has resolved Her incisions are healing nicely She has follow-up with her surgeon for postop eval in a few weeks Recheck LFTs

## 2022-08-09 NOTE — Assessment & Plan Note (Signed)
Possible postop ileus or constipation related to pain medications She is having flatus, so doubt bowel obstruction Encouraged her to start MiraLAX and start advancing her diet to improve constipation Return precautions discussed

## 2022-08-09 NOTE — Assessment & Plan Note (Signed)
Chronic and stable Asymptomatic Continue to monitor 

## 2022-08-09 NOTE — Assessment & Plan Note (Signed)
In the setting of recent surgery Recheck CBC today

## 2022-08-09 NOTE — Patient Instructions (Signed)
Start miralax 1 capfull daily - ok to go to twice daily if needed for soft bowel movements

## 2022-08-09 NOTE — Assessment & Plan Note (Signed)
Continue potassium supplement Recheck today

## 2022-08-09 NOTE — Progress Notes (Signed)
I,Sulibeya S Dimas,acting as a Neurosurgeon for Shirlee Latch, MD.,have documented all relevant documentation on the behalf of Shirlee Latch, MD,as directed by  Shirlee Latch, MD while in the presence of Shirlee Latch, MD.     Established patient visit   Patient: Meredith Hamilton   DOB: 09-22-1950   72 y.o. Female  MRN: [id number] Visit Date: 08/09/2022  Today's healthcare provider: Shirlee Latch, MD   Chief Complaint  Patient presents with   Hospitalization Follow-up   Subjective    HPI  Follow up Hospitalization  Patient was admitted to [facility] on 08/02/22 and discharged on 08/05/22. She was treated for choledocholithiasis with cholecytitis. Treatment for this included surgery. Telephone follow up was done on 08/08/22 She reports excellent compliance with treatment. She reports this condition is improved.   Easing back into her diet - on a soft/bland diet currently  No BM in the last week +flatus No abd pain  Taking potassium regularly. ----------------------------------------------------------------------------------------- -   Medications: Outpatient Medications Prior to Visit  Medication Sig   anastrozole (ARIMIDEX) 1 MG tablet TAKE 1 TABLET(1 MG) BY MOUTH DAILY (Patient taking differently: Take 1 mg by mouth daily.)   BLACK COHOSH PO Take by mouth. 2 Tsp daily   Calcium-Magnesium (CAL-MAG PO) Take by mouth.   Cholecalciferol (VITAMIN D3) LIQD Take 6 drops by mouth daily.   hydrochlorothiazide (HYDRODIURIL) 12.5 MG tablet Take 1 tablet (12.5 mg total) by mouth daily.   OVER THE COUNTER MEDICATION Take 4 Scoops by mouth daily. Pecta Sol-C (Mod. Citrus Pectin)   OVER THE COUNTER MEDICATION Take 2 Scoops by mouth daily. Fermented Mushroom Blend   OVER THE COUNTER MEDICATION Take 6 capsules by mouth daily. Cruciferous Complete   OVER THE COUNTER MEDICATION Nitric Balance 4 tsp daily as needed   oxyCODONE (OXY IR/ROXICODONE) 5 MG immediate release tablet  Take 1 tablet (5 mg total) by mouth every 6 (six) hours as needed for moderate pain or severe pain.   potassium chloride SA (KLOR-CON M) 20 MEQ tablet Take 1 tablet (20 mEq total) by mouth daily for 5 days.   UNABLE TO FIND Take 10 drops by mouth daily. Med Name: Liquid Vitamin A   UNABLE TO FIND Med Name: Hydrex 2 vials daily   UNABLE TO FIND Med Name: Adrena Calm 1/4 pump daily   UNABLE TO FIND Med Name: BIOST skeletal and cellular Health 6 capsules daily   UNABLE TO FIND Med Name: A-C Carbamide 3 daily   UNABLE TO FIND Med Name: Derma Co 1 daily   [DISCONTINUED] naproxen (NAPROSYN) 500 MG tablet TAKE 1 TABLET(500 MG) BY MOUTH TWICE DAILY WITH A MEAL (Patient not taking: Reported on 08/09/2022)   [DISCONTINUED] triamcinolone ointment (KENALOG) 0.5 % Apply 1 application. topically 2 (two) times daily. (Patient not taking: Reported on 08/09/2022)   No facility-administered medications prior to visit.    Review of Systems  Constitutional:  Negative for chills and fever.  Gastrointestinal:  Positive for abdominal pain. Negative for nausea and vomiting.       Objective    BP 138/83 (BP Location: Left Arm, Patient Position: Sitting, Cuff Size: Large)   Pulse (!) 50   Temp 98.2 F (36.8 C) (Temporal)   Resp 12   Wt 185 lb 8 oz (84.1 kg)   BMI 29.94 kg/m  BP Readings from Last 3 Encounters:  08/09/22 138/83  08/05/22 137/64  08/02/22 (!) 134/58   Wt Readings from Last 3 Encounters:  08/09/22 185 lb 8  oz (84.1 kg)  08/02/22 187 lb 6.3 oz (85 kg)  08/02/22 189 lb (85.7 kg)      Physical Exam Vitals reviewed.  Constitutional:      General: She is not in acute distress.    Appearance: Normal appearance. She is well-developed. She is not diaphoretic.  HENT:     Head: Normocephalic and atraumatic.  Eyes:     General: No scleral icterus.    Conjunctiva/sclera: Conjunctivae normal.  Neck:     Thyroid: No thyromegaly.  Cardiovascular:     Rate and Rhythm: Normal rate and regular  rhythm.     Pulses: Normal pulses.     Heart sounds: Normal heart sounds. No murmur heard. Pulmonary:     Effort: Pulmonary effort is normal. No respiratory distress.     Breath sounds: Normal breath sounds. No wheezing, rhonchi or rales.  Musculoskeletal:     Cervical back: Neck supple.     Right lower leg: No edema.     Left lower leg: No edema.  Lymphadenopathy:     Cervical: No cervical adenopathy.  Skin:    General: Skin is warm and dry.     Findings: No rash.  Neurological:     Mental Status: She is alert and oriented to person, place, and time. Mental status is at baseline.  Psychiatric:        Mood and Affect: Mood normal.        Behavior: Behavior normal.       No results found for any visits on 08/09/22.  Assessment & Plan     Problem List Items Addressed This Visit       Digestive   Choledocholithiasis with cholecystitis - Primary    Status post ERCP and cholecystectomy Patient is doing well and pain has resolved Her incisions are healing nicely She has follow-up with her surgeon for postop eval in a few weeks Recheck LFTs      Relevant Orders   CBC   Magnesium   Comprehensive metabolic panel     Other   Elevated liver function tests    Secondary to choledocholithiasis Recheck LFTs today      Relevant Orders   Comprehensive metabolic panel   Asymptomatic bradycardia    Chronic and stable Asymptomatic Continue to monitor      Hypokalemia    Continue potassium supplement Recheck today      Relevant Orders   Magnesium   Comprehensive metabolic panel   Constipation    Possible postop ileus or constipation related to pain medications She is having flatus, so doubt bowel obstruction Encouraged her to start MiraLAX and start advancing her diet to improve constipation Return precautions discussed      Anemia    In the setting of recent surgery Recheck CBC today      Relevant Orders   CBC     Return for as scheduled.      I,  Shirlee Latch, MD, have reviewed all documentation for this visit. The documentation on 08/09/22 for the exam, diagnosis, procedures, and orders are all accurate and complete.   Hilde Churchman, Marzella Schlein, MD, MPH [facility]

## 2022-08-10 LAB — COMPREHENSIVE METABOLIC PANEL
ALT: 86 IU/L — ABNORMAL HIGH (ref 0–32)
AST: 67 IU/L — ABNORMAL HIGH (ref 0–40)
Albumin/Globulin Ratio: 2.2 (ref 1.2–2.2)
Albumin: 4.6 g/dL (ref 3.8–4.8)
Alkaline Phosphatase: 123 IU/L — ABNORMAL HIGH (ref 44–121)
BUN/Creatinine Ratio: 12 (ref 12–28)
BUN: 10 mg/dL (ref 8–27)
Bilirubin Total: 0.9 mg/dL (ref 0.0–1.2)
CO2: 21 mmol/L (ref 20–29)
Calcium: 10 mg/dL (ref 8.7–10.3)
Chloride: 107 mmol/L — ABNORMAL HIGH (ref 96–106)
Creatinine, Ser: 0.85 mg/dL (ref 0.57–1.00)
Globulin, Total: 2.1 g/dL (ref 1.5–4.5)
Glucose: 103 mg/dL — ABNORMAL HIGH (ref 70–99)
Potassium: 4.4 mmol/L (ref 3.5–5.2)
Sodium: 141 mmol/L (ref 134–144)
Total Protein: 6.7 g/dL (ref 6.0–8.5)
eGFR: 72 mL/min/{1.73_m2} (ref >59)

## 2022-08-10 LAB — CBC
Hematocrit: 40.7 % (ref 34.0–46.6)
Hemoglobin: 13.5 g/dL (ref 11.1–15.9)
MCH: 31.1 pg (ref 26.6–33.0)
MCHC: 33.2 g/dL (ref 31.5–35.7)
MCV: 94 fL (ref 79–97)
Platelets: 242 10*3/uL (ref 150–450)
RBC: 4.34 x10E6/uL (ref 3.77–5.28)
RDW: 12.8 % (ref 11.7–15.4)
WBC: 7.2 10*3/uL (ref 3.4–10.8)

## 2022-08-10 LAB — MAGNESIUM: Magnesium: 1.7 mg/dL (ref 1.6–2.3)

## 2022-08-15 ENCOUNTER — Telehealth: Payer: Self-pay

## 2022-08-15 DIAGNOSIS — R7989 Other specified abnormal findings of blood chemistry: Secondary | ICD-10-CM

## 2022-08-15 NOTE — Telephone Encounter (Signed)
-----   Message from Erasmo Downer, MD sent at 08/12/2022 10:35 AM EDT ----- Normal/stable labs.  Liver function tests are improving, but not back to normal yet.  Recheck in 2 weeks

## 2022-08-30 LAB — COMPREHENSIVE METABOLIC PANEL
ALT: 15 IU/L (ref 0–32)
AST: 24 IU/L (ref 0–40)
Albumin/Globulin Ratio: 2.3 — ABNORMAL HIGH (ref 1.2–2.2)
Albumin: 4.4 g/dL (ref 3.8–4.8)
Alkaline Phosphatase: 90 IU/L (ref 44–121)
BUN/Creatinine Ratio: 13 (ref 12–28)
BUN: 9 mg/dL (ref 8–27)
Bilirubin Total: 0.7 mg/dL (ref 0.0–1.2)
CO2: 21 mmol/L (ref 20–29)
Calcium: 9.4 mg/dL (ref 8.7–10.3)
Chloride: 109 mmol/L — ABNORMAL HIGH (ref 96–106)
Creatinine, Ser: 0.72 mg/dL (ref 0.57–1.00)
Globulin, Total: 1.9 g/dL (ref 1.5–4.5)
Glucose: 103 mg/dL — ABNORMAL HIGH (ref 70–99)
Potassium: 3.5 mmol/L (ref 3.5–5.2)
Sodium: 144 mmol/L (ref 134–144)
Total Protein: 6.3 g/dL (ref 6.0–8.5)
eGFR: 88 mL/min/{1.73_m2} (ref >59)

## 2022-09-08 LAB — HM MAMMOGRAPHY

## 2022-10-04 ENCOUNTER — Telehealth: Payer: Self-pay | Admitting: Family Medicine

## 2022-10-31 ENCOUNTER — Other Ambulatory Visit: Payer: Self-pay | Admitting: Oncology

## 2022-10-31 ENCOUNTER — Other Ambulatory Visit: Payer: Self-pay | Admitting: Physician Assistant

## 2022-10-31 DIAGNOSIS — I10 Essential (primary) hypertension: Secondary | ICD-10-CM

## 2022-11-01 NOTE — Telephone Encounter (Signed)
Unable to refill per protocol, Rx expired. Discontinued 03/31/22.  Requested Prescriptions  Pending Prescriptions Disp Refills   amLODipine (NORVASC) 10 MG tablet [Pharmacy Med Name: AMLODIPINE BESYLATE 10MG  TABLETS] 90 tablet 3    Sig: TAKE 1 TABLET(10 MG) BY MOUTH DAILY     Cardiovascular: Calcium Channel Blockers 2 Passed - 10/31/2022 10:13 AM      Passed - Last BP in normal range    BP Readings from Last 1 Encounters:  08/09/22 138/83         Passed - Last Heart Rate in normal range    Pulse Readings from Last 1 Encounters:  08/09/22 (!) 50         Passed - Valid encounter within last 6 months    Recent Outpatient Visits           2 months ago Choledocholithiasis with cholecystitis   [place] [facility] [place], Marzella Schlein, MD   5 months ago Essential (primary) hypertension   [place] [facility] [place], Marzella Schlein, MD   7 months ago Encounter for annual wellness visit (AWV) in Medicare patient   [facility] [place], Marzella Schlein, MD   1 year ago Essential (primary) hypertension   [facility] Alfredia Ferguson, PA-C   1 year ago Trochanteric bursitis of right hip   [facility] [place], Marzella Schlein, MD       Future Appointments             In 1 week Bacigalupo, Marzella Schlein, MD [facility]

## 2022-11-05 ENCOUNTER — Other Ambulatory Visit: Payer: Self-pay | Admitting: Family Medicine

## 2022-11-08 ENCOUNTER — Other Ambulatory Visit: Payer: Self-pay | Admitting: Family Medicine

## 2022-11-09 NOTE — Telephone Encounter (Signed)
Unable to refill per protocol, Rx expired. Discontinued 10/07/21.  Requested Prescriptions  Pending Prescriptions Disp Refills   amLODipine (NORVASC) 10 MG tablet [Pharmacy Med Name: AMLODIPINE BESYLATE 10MG  TABLETS] 90 tablet 0    Sig: TAKE 1 TABLET(10 MG) BY MOUTH DAILY     Cardiovascular: Calcium Channel Blockers 2 Passed - 11/08/2022  9:06 AM      Passed - Last BP in normal range    BP Readings from Last 1 Encounters:  08/09/22 138/83         Passed - Last Heart Rate in normal range    Pulse Readings from Last 1 Encounters:  08/09/22 (!) 50         Passed - Valid encounter within last 6 months    Recent Outpatient Visits           3 months ago Choledocholithiasis with cholecystitis   [place] [facility] [place], Marzella Schlein, MD   6 months ago Essential (primary) hypertension   [place] [facility] [place], Marzella Schlein, MD   7 months ago Encounter for annual wellness visit (AWV) in Medicare patient   [facility] [place], Marzella Schlein, MD   1 year ago Essential (primary) hypertension   [place] [facility] Alfredia Ferguson, PA-C   1 year ago Trochanteric bursitis of right hip   [facility] [place]al[place], Marzella Schlein, MD       Future Appointments             In 2 days Bacigalupo, Marzella Schlein, MD [facility]

## 2022-11-10 ENCOUNTER — Ambulatory Visit: Payer: Medicare HMO | Admitting: Family Medicine

## 2022-11-11 ENCOUNTER — Encounter: Payer: Self-pay | Admitting: Family Medicine

## 2022-11-11 ENCOUNTER — Ambulatory Visit: Payer: Medicare HMO | Admitting: Family Medicine

## 2022-11-11 VITALS — BP 130/70 | HR 55 | Temp 97.3°F | Ht 66.0 in | Wt 190.0 lb

## 2022-11-11 DIAGNOSIS — R739 Hyperglycemia, unspecified: Secondary | ICD-10-CM

## 2022-11-11 DIAGNOSIS — E876 Hypokalemia: Secondary | ICD-10-CM

## 2022-11-11 DIAGNOSIS — I10 Essential (primary) hypertension: Secondary | ICD-10-CM | POA: Diagnosis not present

## 2022-11-11 DIAGNOSIS — E559 Vitamin D deficiency, unspecified: Secondary | ICD-10-CM | POA: Diagnosis not present

## 2022-11-11 DIAGNOSIS — R7989 Other specified abnormal findings of blood chemistry: Secondary | ICD-10-CM

## 2022-11-11 DIAGNOSIS — Z13220 Encounter for screening for lipoid disorders: Secondary | ICD-10-CM

## 2022-11-11 DIAGNOSIS — E538 Deficiency of other specified B group vitamins: Secondary | ICD-10-CM

## 2022-11-11 MED ORDER — HYDROCHLOROTHIAZIDE 12.5 MG PO TABS: 12.5000 mg | ORAL_TABLET | Freq: Every day | ORAL | 3 refills | Status: DC

## 2022-11-11 NOTE — Progress Notes (Signed)
Established Patient Office Visit  Subjective   Patient ID: Meredith Hamilton, female    DOB: 24-Jul-1950  Age: 72 y.o. MRN: [id number]  Chief Complaint  Patient presents with   Hypertension    Patient states she has been doing well, feels well and has no concerns.  She is due for refill of her hydrochlorothiazide.  She reports good compliance and tolerance of her medications.     Hypertension    Discussed the use of AI scribe software for clinical note transcription with the patient, who gave verbal consent to proceed.  History of Present Illness   The patient presents for a routine check-up. She reports that her blood pressure readings at home have been around 138/70, which is slightly elevated. She is currently taking hydrochlorothiazide 12.5mg  daily for blood pressure management. She denies any new or concerning symptoms and reports feeling well overall. The patient has a history of low vitamin D and B12 levels.         ROS per HPI    Objective:     BP 130/70 (BP Location: Right Arm, Patient Position: Sitting, Cuff Size: Large)   Pulse (!) 55   Temp (!) 97.3 F (36.3 C) (Oral)   Ht 5\' 6"  (1.676 m)   Wt 190 lb (86.2 kg)   SpO2 97%   BMI 30.67 kg/m    Physical Exam Vitals reviewed.  Constitutional:      General: She is not in acute distress.    Appearance: Normal appearance. She is well-developed. She is not diaphoretic.  HENT:     Head: Normocephalic and atraumatic.  Eyes:     General: No scleral icterus.    Conjunctiva/sclera: Conjunctivae normal.  Neck:     Thyroid: No thyromegaly.  Cardiovascular:     Rate and Rhythm: Normal rate and regular rhythm.     Heart sounds: Normal heart sounds. No murmur heard. Pulmonary:     Effort: Pulmonary effort is normal. No respiratory distress.     Breath sounds: Normal breath sounds. No wheezing, rhonchi or rales.  Musculoskeletal:     Cervical back: Neck supple.     Right lower leg: No edema.     Left lower leg: No  edema.  Lymphadenopathy:     Cervical: No cervical adenopathy.  Skin:    General: Skin is warm and dry.     Findings: No rash.  Neurological:     Mental Status: She is alert and oriented to person, place, and time. Mental status is at baseline.  Psychiatric:        Mood and Affect: Mood normal.        Behavior: Behavior normal.      No results found for any visits on 11/11/22.    The 10-year ASCVD risk score (Arnett DK, et al., 2019) is: 18.9%    Assessment & Plan:   Problem List Items Addressed This Visit       Cardiovascular and Mediastinum   Essential (primary) hypertension   Relevant Medications   hydrochlorothiazide (HYDRODIURIL) 12.5 MG tablet   Other Relevant Orders   Comprehensive metabolic panel     Other   Vitamin D insufficiency   Relevant Orders   VITAMIN D 25 Hydroxy (Vit-D Deficiency, Fractures)   Vitamin B12 deficiency   Relevant Orders   B12   Elevated liver function tests - Primary   Relevant Orders   Comprehensive metabolic panel   Hypokalemia   Relevant Orders   Comprehensive metabolic panel  Other Visit Diagnoses     Hyperglycemia       Relevant Orders   Hemoglobin A1c   Screening for lipid disorders       Relevant Orders   Lipid panel          Hypertension Elevated blood pressure in the office, but patient reports normal readings at home. Currently on Hydrochlorothiazide 12.5mg  daily. -Recheck blood pressure at the end of the visit. -Refill Hydrochlorothiazide 12.5mg  daily, to be picked up at [facility].  General Health Maintenance No current concerns. Last physical was in December of last year. -Order labs today including cholesterol, kidney and liver function, A1C, and vitamin D and B12 levels. -Schedule physical for December.        Return in about 21 weeks (around 04/07/2023) for CPE, AWV.    Shirlee Latch, MD

## 2022-11-23 ENCOUNTER — Other Ambulatory Visit: Payer: Self-pay | Admitting: Family Medicine

## 2022-11-23 MED ORDER — HYDROCHLOROTHIAZIDE 12.5 MG PO TABS: 12.5000 mg | ORAL_TABLET | Freq: Every day | ORAL | 3 refills | Status: DC

## 2022-11-23 NOTE — Telephone Encounter (Signed)
Patient states that pharmacy did not receive rx for hydrochlorothiazide (HYDRODIURIL) 12.5 MG tablet. Please resend to  [id number] - [place], [place] - [address] AT [facility] [address] Phone: [phone]  Fax: [phone]

## 2022-11-23 NOTE — Telephone Encounter (Signed)
Resending as provider initially ordered on 11/11/22 #90/3.  Requested Prescriptions  Pending Prescriptions Disp Refills   hydrochlorothiazide (HYDRODIURIL) 12.5 MG tablet 90 tablet 3    Sig: Take 1 tablet (12.5 mg total) by mouth daily.     Cardiovascular: Diuretics - Thiazide Failed - 11/23/2022 11:36 AM      Failed - K in normal range and within 180 days    Potassium  Date Value Ref Range Status  11/11/2022 3.4 (L) 3.5 - 5.2 mmol/L Final         Passed - Cr in normal range and within 180 days    Creatinine, Ser  Date Value Ref Range Status  11/11/2022 0.76 0.57 - 1.00 mg/dL Final         Passed - Na in normal range and within 180 days    Sodium  Date Value Ref Range Status  11/11/2022 144 134 - 144 mmol/L Final         Passed - Last BP in normal range    BP Readings from Last 1 Encounters:  11/11/22 130/70         Passed - Valid encounter within last 6 months    Recent Outpatient Visits           1 week ago Elevated liver function tests   [facility] [place], Marzella Schlein, MD   3 months ago Choledocholithiasis with cholecystitis   [place] [facility] [place], Marzella Schlein, MD   6 months ago Essential (primary) hypertension   [facility] [place], Marzella Schlein, MD   7 months ago Encounter for annual wellness visit (AWV) in Medicare patient   [facility] [place], Marzella Schlein, MD   1 year ago Essential (primary) hypertension   [place] [facility] Alfredia Ferguson, PA-C       Future Appointments             In 4 months Bacigalupo, Marzella Schlein, MD [facility]

## 2022-12-21 ENCOUNTER — Ambulatory Visit (INDEPENDENT_AMBULATORY_CARE_PROVIDER_SITE_OTHER): Payer: Medicare HMO

## 2022-12-21 VITALS — Ht 66.0 in | Wt 183.0 lb

## 2022-12-21 DIAGNOSIS — Z0000 Encounter for general adult medical examination without abnormal findings: Secondary | ICD-10-CM | POA: Diagnosis not present

## 2022-12-21 NOTE — Patient Instructions (Signed)
Ms. Woolf , Thank you for taking time to come for your Medicare Wellness Visit. I appreciate your ongoing commitment to your health goals. Please review the following plan we discussed and let me know if I can assist you in the future.   Referrals/Orders/Follow-Ups/Clinician Recommendations: none  This is a list of the screening recommended for you and due dates:  Health Maintenance  Topic Date Due   COVID-19 Vaccine (1) Never done   DTaP/Tdap/Td vaccine (1 - Tdap) Never done   Zoster (Shingles) Vaccine (1 of 2) Never done   Colon Cancer Screening  Never done   Pneumonia Vaccine (1 of 1 - PCV) Never done   Mammogram  07/09/2022   Flu Shot  07/10/2023*   Medicare Annual Wellness Visit  12/21/2023   DEXA scan (bone density measurement)  05/26/2024   Hepatitis C Screening  Completed   HPV Vaccine  Aged Out  *Topic was postponed. The date shown is not the original due date.    Advanced directives: (Declined) Advance directive discussed with you today. Even though you declined this today, please call our office should you change your mind, and we can give you the proper paperwork for you to fill out.  Next Medicare Annual Wellness Visit scheduled for next year: Yes 12/25/2023 @ 8:55am telephone

## 2022-12-21 NOTE — Progress Notes (Signed)
Subjective:   Meredith Hamilton is a 72 y.o. female who presents for Medicare Annual (Subsequent) preventive examination.  Visit Complete: Virtual  I connected with  Meredith Hamilton on 12/21/22 by a audio enabled telemedicine application and verified that I am speaking with the correct person using two identifiers.  Patient Location: Home  Provider Location: Office/Clinic  I discussed the limitations of evaluation and management by telemedicine. The patient expressed understanding and agreed to proceed.  Vital Signs: Unable to obtain new vitals due to this being a telehealth visit.  Patient Medicare AWV questionnaire was completed by the patient on (not done); I have confirmed that all information answered by patient is correct and no changes since this date.  Review of Systems    Cardiac Risk Factors include: advanced age (>67men, >29 women);hypertension     Objective:    Today's Vitals   12/21/22 0844  Weight: 183 lb (83 kg)  Height: 5\' 6"  (1.676 m)   Body mass index is 29.54 kg/m.     12/21/2022    8:54 AM 08/03/2022   12:10 PM 08/02/2022    6:57 AM 07/05/2022    9:51 AM 01/04/2022    9:17 AM 07/06/2021   10:14 AM 03/02/2021   10:35 AM  Advanced Directives  Does Patient Have a Medical Advance Directive? No No No No No No No  Would patient like information on creating a medical advance directive?   No - Patient declined  No - Patient declined  No - Patient declined    Current Medications (verified) Outpatient Encounter Medications as of 12/21/2022  Medication Sig   anastrozole (ARIMIDEX) 1 MG tablet TAKE 1 TABLET(1 MG) BY MOUTH DAILY   BLACK COHOSH PO Take by mouth. 2 Tsp daily   Calcium-Magnesium (CAL-MAG PO) Take by mouth.   Cholecalciferol (VITAMIN D3) LIQD Take 6 drops by mouth daily.   hydrochlorothiazide (HYDRODIURIL) 12.5 MG tablet Take 1 tablet (12.5 mg total) by mouth daily.   OVER THE COUNTER MEDICATION Take 4 Scoops by mouth daily. Pecta Sol-C (Mod. Citrus Pectin)    OVER THE COUNTER MEDICATION Take 2 Scoops by mouth daily. Fermented Mushroom Blend   OVER THE COUNTER MEDICATION Take 6 capsules by mouth daily. Cruciferous Complete   OVER THE COUNTER MEDICATION Nitric Balance 4 tsp daily as needed   UNABLE TO FIND Take 10 drops by mouth daily. Med Name: Liquid Vitamin A   UNABLE TO FIND Med Name: Hydrex 2 vials daily   UNABLE TO FIND Med Name: Adrena Calm 1/4 pump daily   UNABLE TO FIND Med Name: BIOST skeletal and cellular Health 6 capsules daily   UNABLE TO FIND Med Name: A-C Carbamide 3 daily   UNABLE TO FIND Med Name: Derma Co 1 daily   oxyCODONE (OXY IR/ROXICODONE) 5 MG immediate release tablet Take 1 tablet (5 mg total) by mouth every 6 (six) hours as needed for moderate pain or severe pain. (Patient not taking: Reported on 12/21/2022)   potassium chloride SA (KLOR-CON M) 20 MEQ tablet Take 1 tablet (20 mEq total) by mouth daily for 5 days.   No facility-administered encounter medications on file as of 12/21/2022.    Allergies (verified) Other, Sulfa antibiotics, and Tramadol   History: Past Medical History:  Diagnosis Date   Breast CA (HCC)    Family history of breast cancer    Family history of lung cancer    Family history of throat cancer    Hashimoto's disease    Hypertension  Non-celiac gluten sensitivity    Past Surgical History:  Procedure Laterality Date   APPENDECTOMY  1979   CHOLECYSTECTOMY N/A 08/04/2022   Procedure: LAPAROSCOPIC CHOLECYSTECTOMY WITH ICG DYE;  Surgeon: Andria Meuse, MD;  Location: [facility] OR;  Service: General;  Laterality: N/A;   ERCP N/A 08/03/2022   Procedure: ENDOSCOPIC RETROGRADE CHOLANGIOPANCREATOGRAPHY (ERCP);  Surgeon: Iva Boop, MD;  Location: [facility] ENDOSCOPY;  Service: Gastroenterology;  Laterality: N/A;   LUMBAR DISC SURGERY  1974   MASTECTOMY W/ SENTINEL NODE BIOPSY Left 2019   RIGHT OOPHORECTOMY  1979   due to ovarian torsion   SCLEROTHERAPY  08/03/2022   Procedure: SCLEROTHERAPY;  Surgeon:  Iva Boop, MD;  Location: [facility] ENDOSCOPY;  Service: Gastroenterology;;   Dennison Mascot  08/03/2022   Procedure: Dennison Mascot;  Surgeon: Iva Boop, MD;  Location: [facility] ENDOSCOPY;  Service: Gastroenterology;;   URETEROSCOPY Right 2017   Family History  Problem Relation Age of Onset   Lung cancer Mother 70       hx smoking   Lung cancer Father 36       hx smoking   Breast cancer Sister 57       in remission, now in mid 61s   Lung cancer Brother 7       smoker   Throat cancer Brother        alcohol   Colon cancer Neg Hx    Cervical cancer Neg Hx    Ovarian cancer Neg Hx    Social History   Socioeconomic History   Marital status: Divorced    Spouse name: Not on file   Number of children: 2   Years of education: 12   Highest education level: High school graduate  Occupational History   Occupation: retired Software engineer for [employer]  Tobacco Use   Smoking status: Never   Smokeless tobacco: Never  Vaping Use   Vaping status: Never Used  Substance and Sexual Activity   Alcohol use: Not Currently   Drug use: Never   Sexual activity: Not Currently  Other Topics Concern   Not on file  Social History Narrative   Not on file   Social Determinants of Health   Financial Resource Strain: Low Risk  (12/21/2022)   Overall Financial Resource Strain (CARDIA)    Difficulty of Paying Living Expenses: Not very hard  Food Insecurity: No Food Insecurity (12/21/2022)   Hunger Vital Sign    Worried About Running Out of Food in the Last Year: Never true    Ran Out of Food in the Last Year: Never true  Transportation Needs: No Transportation Needs (12/21/2022)   PRAPARE - Administrator, Civil Service (Medical): No    Lack of Transportation (Non-Medical): No  Physical Activity: Insufficiently Active (12/21/2022)   Exercise Vital Sign    Days of Exercise per Week: 6 days    Minutes of Exercise per Session: 10 min  Stress: No Stress Concern Present (12/21/2022)    [employer] of Occupational Health - Occupational Stress Questionnaire    Feeling of Stress : Not at all  Social Connections: Moderately Isolated (12/21/2022)   Social Connection and Isolation Panel [NHANES]    Frequency of Communication with Friends and Family: More than three times a week    Frequency of Social Gatherings with Friends and Family: More than three times a week    Attends Religious Services: More than 4 times per year    Active Member of Clubs or Organizations: No  Attends Banker Meetings: Never    Marital Status: Divorced    Tobacco Counseling Counseling given: Not Answered   Clinical Intake:  Pre-visit preparation completed: Yes  Pain : No/denies pain   BMI - recorded: 29.54 Nutritional Status: BMI 25 -29 Overweight Nutritional Risks: None Diabetes: No  How often do you need to have someone help you when you read instructions, pamphlets, or other written materials from your doctor or pharmacy?: 1 - Never  Interpreter Needed?: No  Comments: lives with son and daughter Information entered by :: B.Kaedan Richert,LPN   Activities of Daily Living    12/21/2022    8:54 AM 11/11/2022   10:49 AM  In your present state of health, do you have any difficulty performing the following activities:  Hearing? 0 0  Vision? 0 0  Difficulty concentrating or making decisions? 0 0  Walking or climbing stairs? 1 1  Dressing or bathing? 0 0  Doing errands, shopping? 0 0  Preparing Food and eating ? N   Using the Toilet? N   In the past six months, have you accidently leaked urine? N   Do you have problems with loss of bowel control? N   Managing your Medications? N   Managing your Finances? N   Housekeeping or managing your Housekeeping? N     Patient Care Team: Erasmo Downer, MD as PCP - General (Family Medicine) Joie Bimler, DC (Chiropractic Medicine) Barkley Boards, NP (Nurse Practitioner) Trey Sailors, PA-C (Inactive) as  Physician Assistant (Physician Assistant) Creig Hines, MD as Consulting Physician (Oncology) Pa, [facility] (Optometry)  Indicate any recent Medical Services you may have received from other than [facility] providers in the past year (date may be approximate).     Assessment:   This is a routine wellness examination for Shanikwa.  Hearing/Vision screen Hearing Screening - Comments:: Adequate hearing Vision Screening - Comments:: Adequate vision w/glasses [facility]   Goals Addressed             This Visit's Progress    COMPLETED: DIET - REDUCE SUGAR INTAKE   On track    Continue current diet plan of cutting out sugar in daily diet to help aid in weight loss.      COMPLETED: Exercise 3x per week (30 min per time)   On track    Recommend to exercise for 3 days a week for at least 30 minutes at a time.      Patient Stated       Maintain my health and keep pushing in mobility       Depression Screen    12/21/2022    8:51 AM 11/11/2022   10:48 AM 08/09/2022    2:36 PM 05/10/2022    9:53 AM 03/31/2022    2:44 PM 03/02/2021   10:33 AM 02/27/2020    9:10 AM  PHQ 2/9 Scores  PHQ - 2 Score 0 0 0 0 0 0 1  PHQ- 9 Score  0 4 0 1      Fall Risk    12/21/2022    8:47 AM 11/11/2022   10:48 AM 05/10/2022    9:53 AM 03/31/2022    2:44 PM 09/03/2021    8:33 AM  Fall Risk   Falls in the past year? 0 0 0 0 0  Number falls in past yr: 0 0 0 0 0  Injury with Fall? 0 0 0 0 0  Risk for fall due to :  No Fall Risks  No Fall Risks No Fall Risks   Follow up Falls prevention discussed;Education provided  Falls evaluation completed Falls evaluation completed     MEDICARE RISK AT HOME: Medicare Risk at Home Any stairs in or around the home?: Yes If so, are there any without handrails?: Yes Home free of loose throw rugs in walkways, pet beds, electrical cords, etc?: Yes Adequate lighting in your home to reduce risk of falls?: Yes Life alert?: No Use of a cane, walker or w/c?: No Grab  bars in the bathroom?: Yes Shower chair or bench in shower?: No Elevated toilet seat or a handicapped toilet?: No  TIMED UP AND GO:  Was the test performed?  No    Cognitive Function:        12/21/2022    8:55 AM 02/27/2020    9:16 AM 02/18/2019    9:38 AM 01/25/2018   10:11 AM  6CIT Screen  What Year? 0 points 0 points 0 points 0 points  What month? 0 points 0 points 0 points 0 points  What time? 0 points 0 points 0 points 0 points  Count back from 20 0 points 0 points 0 points 0 points  Months in reverse 0 points 0 points 0 points 0 points  Repeat phrase 0 points 0 points 0 points 0 points  Total Score 0 points 0 points 0 points 0 points    Immunizations  There is no immunization history on file for this patient.  TDAP status: Up to date  Flu Vaccine status: Declined, Education has been provided regarding the importance of this vaccine but patient still declined. Advised may receive this vaccine at local pharmacy or Health Dept. Aware to provide a copy of the vaccination record if obtained from local pharmacy or Health Dept. Verbalized acceptance and understanding.  Pneumococcal vaccine status: Declined,  Education has been provided regarding the importance of this vaccine but patient still declined. Advised may receive this vaccine at local pharmacy or Health Dept. Aware to provide a copy of the vaccination record if obtained from local pharmacy or Health Dept. Verbalized acceptance and understanding.   Covid-19 vaccine status: Declined, Education has been provided regarding the importance of this vaccine but patient still declined. Advised may receive this vaccine at local pharmacy or Health Dept.or vaccine clinic. Aware to provide a copy of the vaccination record if obtained from local pharmacy or Health Dept. Verbalized acceptance and understanding.  Qualifies for Shingles Vaccine? Yes   Zostavax completed No   Shingrix Completed?: No.    Education has been provided  regarding the importance of this vaccine. Patient has been advised to call insurance company to determine out of pocket expense if they have not yet received this vaccine. Advised may also receive vaccine at local pharmacy or Health Dept. Verbalized acceptance and understanding.  Screening Tests Health Maintenance  Topic Date Due   COVID-19 Vaccine (1) Never done   DTaP/Tdap/Td (1 - Tdap) Never done   Zoster Vaccines- Shingrix (1 of 2) Never done   Colonoscopy  Never done   Pneumonia Vaccine 25+ Years old (1 of 1 - PCV) Never done   MAMMOGRAM  07/09/2022   INFLUENZA VACCINE  07/10/2023 (Originally 11/10/2022)   Medicare Annual Wellness (AWV)  12/21/2023   DEXA SCAN  05/26/2024   Hepatitis C Screening  Completed   HPV VACCINES  Aged Out    Health Maintenance  Health Maintenance Due  Topic Date Due   COVID-19 Vaccine (1) Never  done   DTaP/Tdap/Td (1 - Tdap) Never done   Zoster Vaccines- Shingrix (1 of 2) Never done   Colonoscopy  Never done   Pneumonia Vaccine 55+ Years old (1 of 1 - PCV) Never done   MAMMOGRAM  07/09/2022    Colorectal cancer screening: Type of screening: Colonoscopy. Completed yes. Repeat every 5-10 years Pt says had 5 yrs ago in [place]  Mammogram status: Completed yes. Repeat every year last March 2024  Bone Density status: Completed yes. Results reflect: Bone density results: NORMAL. Repeat every 5 years.  Lung Cancer Screening: (Low Dose CT Chest recommended if Age 27-80 years, 20 pack-year currently smoking OR have quit w/in 15years.) does not qualify.   Lung Cancer Screening Referral: no  Additional Screening:  Hepatitis C Screening: does qualify; Completed  yes  Vision Screening: Recommended annual ophthalmology exams for early detection of glaucoma and other disorders of the eye. Is the patient up to date with their annual eye exam?  Yes  Who is the provider or what is the name of the office in which the patient attends annual eye exams?  [facility] If pt is not established with a provider, would they like to be referred to a provider to establish care? No .   Dental Screening: Recommended annual dental exams for proper oral hygiene  Diabetic Foot Exam: n/a  Community Resource Referral / Chronic Care Management: CRR required this visit?  No   CCM required this visit?  No    Plan:     I have personally reviewed and noted the following in the patient's chart:   Medical and social history Use of alcohol, tobacco or illicit drugs  Current medications and supplements including opioid prescriptions. Patient is not currently taking opioid prescriptions. Functional ability and status Nutritional status Physical activity Advanced directives List of other physicians Hospitalizations, surgeries, and ER visits in previous 12 months Vitals Screenings to include cognitive, depression, and falls Referrals and appointments  In addition, I have reviewed and discussed with patient certain preventive protocols, quality metrics, and best practice recommendations. A written personalized care plan for preventive services as well as general preventive health recommendations were provided to patient.     Sue Lush, LPN   12/24/7827   After Visit Summary: (MyChart) Due to this being a telephonic visit, the after visit summary with patients personalized plan was offered to patient via MyChart   Nurse Notes: The patient states she is doing well and has no concerns or questions at this time.

## 2022-12-31 IMAGING — NM NM BONE WHOLE BODY
2 series · 8 of 8 positions shown · non-contrast
Comparison: CT scan March 29, 2018

CLINICAL DATA: Bilateral hip pain. Left shoulder pain radiates into
arm to elbow. Left knee has given out for the past several months.
Muscle aches. Left mastectomy 3 years ago.

EXAM:
NUCLEAR MEDICINE WHOLE BODY BONE SCAN
TECHNIQUE: Whole body anterior and posterior images were obtained approximately
3 hours after intravenous injection of radiopharmaceutical.
RADIOPHARMACEUTICALS:  20.4 mCi Mechnetium-33m MDP IV

[Series 1000: statics · 2.40mm/px · 3 acquisitions, 6 frames shown]
[im 1/3]
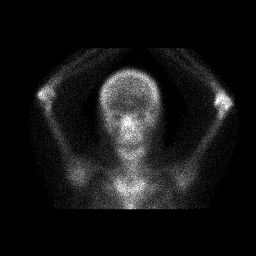
[im 1/3]
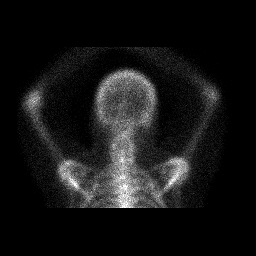
[im 2/3]
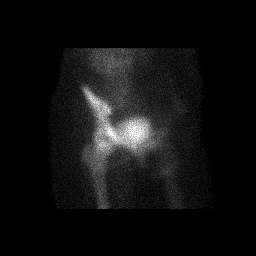
[im 2/3]
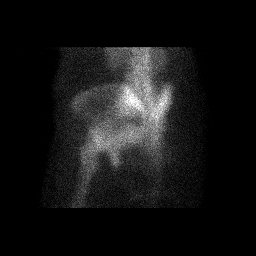
[im 3/3]
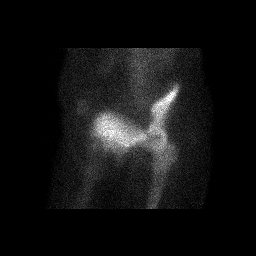
[im 3/3]
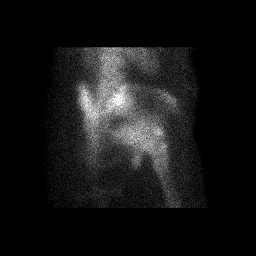

[Series 1000: 3 hr wholebody · 2.40mm/px · 2 of 2 frames shown]
[frame 1/2]
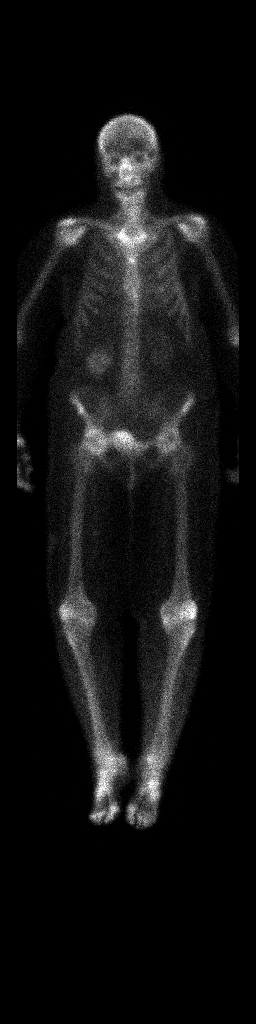
[frame 2/2]
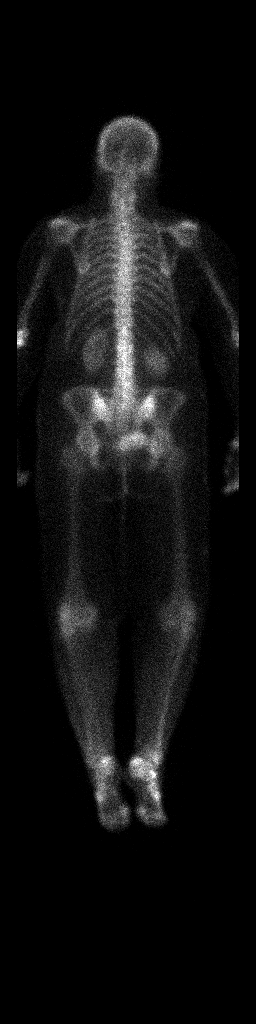

[8 of 8 positions shown; findings below may reference images not displayed]

FINDINGS: Degenerative changes at the sternoclavicular and sternomanubrial
joints. Degenerative changes in the feet. Degenerative changes in
the knees, left-greater-than-right. Uptake in the hips is seen
bilaterally, right greater than left. Uptake in the shoulders is
greater on the right than the left. Degenerative changes in the
cervical, thoracic, and lumbar spine. Uptake in the elbows,
left-greater-than-right, likely degenerative or from previous
trauma.
IMPRESSION: 1. Uptake in the shoulders, greater on the right than the left. This
is favored to be degenerative. Recommend correlation with x-rays.
2. Uptake in the hips is greater on the right than the left. This
correlates with degenerative changes seen on previous imaging.
3. Other scattered degenerative changes as above.

## 2023-04-10 ENCOUNTER — Ambulatory Visit (INDEPENDENT_AMBULATORY_CARE_PROVIDER_SITE_OTHER): Payer: Medicare HMO | Admitting: Family Medicine

## 2023-04-10 ENCOUNTER — Encounter: Payer: Self-pay | Admitting: Family Medicine

## 2023-04-10 VITALS — BP 132/70 | HR 60 | Ht 66.0 in | Wt 189.9 lb

## 2023-04-10 DIAGNOSIS — I10 Essential (primary) hypertension: Secondary | ICD-10-CM | POA: Diagnosis not present

## 2023-04-10 DIAGNOSIS — Z0000 Encounter for general adult medical examination without abnormal findings: Secondary | ICD-10-CM

## 2023-04-10 DIAGNOSIS — L570 Actinic keratosis: Secondary | ICD-10-CM | POA: Diagnosis not present

## 2023-04-10 NOTE — Progress Notes (Signed)
Complete physical exam   Patient: Meredith Hamilton   DOB: 06-10-1950   72 y.o. Female  MRN: [id number] Visit Date: 04/10/2023  Today's healthcare provider: Shirlee Latch, MD   Chief Complaint  Patient presents with   Annual Exam    CPE last completed 03/31/22. AWV completed 12/21/22. Patient reports consuming a gluten free diet that entails no sugar, dairy or wheat. Patient goes walking depending on the weather at least 3 times a week up and down the street. She reports feeling well and sleeping with no problems. Patient reports no concerns.    Immunizations    Influenza: declined Pneumonia: declined Tetanus: Patient must visit pharmacy Shingles: Patient must visit pharmacy   Subjective    Meredith Hamilton is a 72 y.o. female who presents today for a complete physical exam.    Discussed the use of AI scribe software for clinical note transcription with the patient, who gave verbal consent to proceed.  History of Present Illness   The patient, with a history of hypertension managed with hydrochlorothiazide and potassium supplementation, presents for a routine physical. They report no new concerns, but have noticed two skin lesions that have been present for approximately four to five months. One lesion is located on the chest and the other on the back. They deny any changes in the lesions over time. The patient has a history of significant sun exposure in their youth. They also report occasional leg swelling, which has improved since starting hydrochlorothiazide, and itching, which they attribute to dry skin. They are managing this with regular moisturizer application.        Last depression screening scores    04/10/2023    9:22 AM 12/21/2022    8:51 AM 11/11/2022   10:48 AM  PHQ 2/9 Scores  PHQ - 2 Score 0 0 0  PHQ- 9 Score   0   Last fall risk screening    04/10/2023    9:22 AM  Fall Risk   Falls in the past year? 0  Number falls in past yr: 0  Injury with Fall? 0  Risk  for fall due to : No Fall Risks  Follow up Falls evaluation completed        Medications: Outpatient Medications Prior to Visit  Medication Sig   anastrozole (ARIMIDEX) 1 MG tablet TAKE 1 TABLET(1 MG) BY MOUTH DAILY   BLACK COHOSH PO Take by mouth. 2 Tsp daily   Calcium-Magnesium (CAL-MAG PO) Take by mouth.   Cholecalciferol (VITAMIN D3) LIQD Take 6 drops by mouth daily.   hydrochlorothiazide (HYDRODIURIL) 12.5 MG tablet Take 1 tablet (12.5 mg total) by mouth daily.   OVER THE COUNTER MEDICATION Take 4 Scoops by mouth daily. Pecta Sol-C (Mod. Citrus Pectin)   OVER THE COUNTER MEDICATION Take 2 Scoops by mouth daily. Fermented Mushroom Blend   OVER THE COUNTER MEDICATION Take 6 capsules by mouth daily. Cruciferous Complete   OVER THE COUNTER MEDICATION Nitric Balance 4 tsp daily as needed   oxyCODONE (OXY IR/ROXICODONE) 5 MG immediate release tablet Take 1 tablet (5 mg total) by mouth every 6 (six) hours as needed for moderate pain or severe pain. (Patient not taking: Reported on 12/21/2022)   potassium chloride SA (KLOR-CON M) 20 MEQ tablet Take 1 tablet (20 mEq total) by mouth daily for 5 days.   UNABLE TO FIND Take 10 drops by mouth daily. Med Name: Liquid Vitamin A   UNABLE TO FIND Med Name: Hydrex 2 vials daily  UNABLE TO FIND Med Name: Adrena Calm 1/4 pump daily   UNABLE TO FIND Med Name: BIOST skeletal and cellular Health 6 capsules daily   UNABLE TO FIND Med Name: A-C Carbamide 3 daily   UNABLE TO FIND Med Name: Derma Co 1 daily   No facility-administered medications prior to visit.    Review of Systems    Objective    BP 132/70 Comment: pt reported around 6 am  Pulse 60   Ht 5\' 6"  (1.676 m)   Wt 189 lb 14.4 oz (86.1 kg)   SpO2 97%   BMI 30.65 kg/m    Physical Exam Vitals reviewed.  Constitutional:      General: She is not in acute distress.    Appearance: Normal appearance. She is well-developed. She is not diaphoretic.  HENT:     Head: Normocephalic and  atraumatic.     Right Ear: Tympanic membrane, ear canal and external ear normal.     Left Ear: Tympanic membrane, ear canal and external ear normal.     Nose: Nose normal.     Mouth/Throat:     Mouth: Mucous membranes are moist.     Pharynx: Oropharynx is clear. No oropharyngeal exudate.  Eyes:     General: No scleral icterus.    Conjunctiva/sclera: Conjunctivae normal.     Pupils: Pupils are equal, round, and reactive to light.  Neck:     Thyroid: No thyromegaly.  Cardiovascular:     Rate and Rhythm: Normal rate and regular rhythm.     Heart sounds: Murmur heard.  Pulmonary:     Effort: Pulmonary effort is normal. No respiratory distress.     Breath sounds: Normal breath sounds. No wheezing or rales.  Abdominal:     General: There is no distension.     Palpations: Abdomen is soft.     Tenderness: There is no abdominal tenderness.  Musculoskeletal:        General: No deformity.     Cervical back: Neck supple.     Right lower leg: No edema.     Left lower leg: No edema.  Lymphadenopathy:     Cervical: No cervical adenopathy.  Skin:    General: Skin is warm and dry.     Findings: No rash.  Neurological:     Mental Status: She is alert and oriented to person, place, and time. Mental status is at baseline.     Gait: Gait normal.  Psychiatric:        Mood and Affect: Mood normal.        Behavior: Behavior normal.        Thought Content: Thought content normal.        Results for orders placed or performed in visit on 04/10/23  HM MAMMOGRAPHY  Result Value Ref Range   HM Mammogram 0-4 Bi-Rad 0-4 Bi-Rad, Self Reported Normal    Assessment & Plan    Routine Health Maintenance and Physical Exam  Exercise Activities and Dietary recommendations  Goals      Patient Stated     Maintain my health and keep pushing in mobility         There is no immunization history on file for this patient.  Health Maintenance  Topic Date Due   COVID-19 Vaccine (1) Never done    DTaP/Tdap/Td (1 - Tdap) Never done   Zoster Vaccines- Shingrix (1 of 2) Never done   Colonoscopy  Never done   Pneumonia Vaccine 30+ Years old (1  of 1 - PCV) Never done   INFLUENZA VACCINE  07/10/2023 (Originally 11/10/2022)   MAMMOGRAM  09/08/2023   Medicare Annual Wellness (AWV)  12/21/2023   DEXA SCAN  05/26/2024   Hepatitis C Screening  Completed   HPV VACCINES  Aged Out    Discussed health benefits of physical activity, and encouraged her to engage in regular exercise appropriate for her age and condition.  Problem List Items Addressed This Visit       Cardiovascular and Mediastinum   Essential (primary) hypertension   Other Visit Diagnoses       Encounter for annual physical exam    -  Primary     Actinic keratosis       Relevant Orders   Ambulatory referral to Dermatology           Actinic Keratosis Lesion present for 4-5 months, likely actinic keratosis, which can be precancerous. Discussed importance of dermatological evaluation to prevent progression. Explained treatment options including cryotherapy or topical medications. Informed about potential delays in dermatology appointments due to high demand. - Refer to dermatologist in [place] for evaluation and potential treatment options such as cryotherapy or topical medications.  Hypertension Blood pressure well-controlled on current regimen. Leg swelling improved with hydrochlorothiazide. Discussed continuing current medications and regular monitoring. - Continue hydrochlorothiazide 12.5 mg daily. - Continue potassium supplement. - Follow-up in 6 months for blood pressure check.  General Health Maintenance Reviewed recent labs from August, including cholesterol and A1c, which were normal. Blood pressure is well-controlled. Discussed mammogram, colon cancer screening, and vaccinations. Patient declined flu, tetanus, COVID, pneumonia, and shingles vaccines. - No labs needed today. - Pull mammogram records from  March 2024. - Patient to provide records of colonoscopy from 5 years ago for chart update.  Follow-up - Schedule follow-up appointment in 6 months.        Return in about 6 months (around 10/09/2023) for chronic disease f/u.     Shirlee Latch, MD  [facility] [phone] (phone) [phone] (fax)  [facility]

## 2023-07-04 ENCOUNTER — Encounter: Payer: Self-pay | Admitting: Nurse Practitioner

## 2023-07-04 ENCOUNTER — Inpatient Hospital Stay: Payer: Medicare HMO | Attending: Nurse Practitioner | Admitting: Nurse Practitioner

## 2023-07-04 VITALS — BP 149/73 | HR 57 | Temp 96.0°F | Resp 18 | Wt 192.5 lb

## 2023-07-04 DIAGNOSIS — Z79811 Long term (current) use of aromatase inhibitors: Secondary | ICD-10-CM | POA: Insufficient documentation

## 2023-07-04 DIAGNOSIS — Z801 Family history of malignant neoplasm of trachea, bronchus and lung: Secondary | ICD-10-CM | POA: Diagnosis not present

## 2023-07-04 DIAGNOSIS — Z08 Encounter for follow-up examination after completed treatment for malignant neoplasm: Secondary | ICD-10-CM

## 2023-07-04 DIAGNOSIS — Z803 Family history of malignant neoplasm of breast: Secondary | ICD-10-CM | POA: Insufficient documentation

## 2023-07-04 DIAGNOSIS — M8580 Other specified disorders of bone density and structure, unspecified site: Secondary | ICD-10-CM | POA: Diagnosis not present

## 2023-07-04 DIAGNOSIS — Z853 Personal history of malignant neoplasm of breast: Secondary | ICD-10-CM

## 2023-07-04 DIAGNOSIS — Z170 Estrogen receptor positive status [ER+]: Secondary | ICD-10-CM | POA: Diagnosis not present

## 2023-07-04 DIAGNOSIS — C50912 Malignant neoplasm of unspecified site of left female breast: Secondary | ICD-10-CM | POA: Diagnosis present

## 2023-07-04 DIAGNOSIS — Z9012 Acquired absence of left breast and nipple: Secondary | ICD-10-CM | POA: Insufficient documentation

## 2023-07-04 NOTE — Progress Notes (Signed)
 Hematology/Oncology Consult Note [facility]  [phone] [phone]  Patient Care Team: Erasmo Downer, MD as PCP - General (Family Medicine) Joie Bimler, DC (Chiropractic Medicine) Barkley Boards, NP (Nurse Practitioner) Maryella Shivers as Physician Assistant (Physician Assistant) Creig Hines, MD as Consulting Physician (Oncology) [place], [facility])   Name of the patient: Meredith Hamilton  [id number]  1951/04/06   Date of visit: 07/04/23  Diagnosis- h/o left breast cancer pathological prognostic stage IB T2N2M0 ER PR positive her 2 negative s/p mastectomy and axillary LN dissection  Chief complaint/ Reason for visit-routine follow-up of breast cancer, on Arimidex  Heme/Onc history: Patient presented as a 73 year old female who was found to have an abnormal screening mammogram at an outside facility in [place] in February 2019.  This was followed by a mastectomy and a sentinel lymph node biopsy in April 2019.  Intraoperative frozen section of the lymph node was positive and this was converted to an axillary lymph node dissection.  Final pathology revealed mixed pathology of invasive mammary as well as invasive lobular carcinoma.  There were 2 foci measuring 2.1 and 0.8 cm.  Overall grade 2.  ER PR strongly positive greater than 90% and HER-2 was negative.  5 out of 15 axillary lymph nodes were positive for metastatic disease with the largest deposit being 1.8 cm and extranodal extension was present.  Perineural invasion was identified.  No LVI.  Final margins were clear pathological stage T2N2A.    Adjuvant chemotherapy and post-mastectomy radiation were recommended which she declined. She subsequently moved to [facility].   She has also been seen by surgical oncology in [facility] in July 2019.  She has been pursuing alternative therapy with vitamin C infusions as well as other nutritional supplements.    Patient  was started on Arimidex in November 2019. Completed 5 years of therapy.   She continues to be followed by [facility]. She continues to use supplements as well as weekly vitamin c infusions and weekly O2 therapy, massage, laser therapy, and detoxification. She does not use prosthetic devices.   Interval history- Patient is 73 year old female with above history of breast cancer and mastectomy who returns to clinic for routine surveillance and follow up. She completed 5 years of arimidex. She last saw [facility] oncology May 2024. In the interim she has undergone laparoscopic cholecystectomy. Reports mammograms are up to date (performed at [facility]). She performs self breast exams and denies skin changes, breast pain, nodularity of chest wall. Denies unintentional weight loss or other complaints. She continues wellness care with her son in law and daughter. Aches and pains are unchanged since completing arimidex. 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 She feels well and denies complaints.   ECOG PS- 0 Pain scale- 0  Review of systems- Review of Systems  Constitutional:  Negative for chills, fever, malaise/fatigue and weight loss.  HENT:  Negative for congestion, ear discharge and nosebleeds.   Eyes:  Negative for blurred vision.  Respiratory:  Negative for cough, hemoptysis, sputum production, shortness of breath and wheezing.   Cardiovascular:  Negative for chest pain, palpitations, orthopnea and claudication.  Gastrointestinal:  Negative for abdominal pain, blood in stool, constipation, diarrhea, heartburn, melena, nausea and vomiting.  Genitourinary:  Negative for dysuria, flank pain, frequency, hematuria and urgency.  Musculoskeletal:  Positive for joint pain. Negative for back pain and myalgias.  Skin:  Negative for rash.  Neurological:  Negative for dizziness, tingling, focal weakness, seizures, weakness and headaches.  Endo/Heme/Allergies:  Does not bruise/bleed  easily.  Psychiatric/Behavioral:  Negative for depression and suicidal ideas. The patient does not have insomnia.     Allergies  Allergen Reactions   Other Rash   Sulfa Antibiotics Swelling   Tramadol Other (See Comments)    Other reaction(s): Hallucination   Past Medical History:  Diagnosis Date   Breast CA (HCC)    Family history of breast cancer    Family history of lung cancer    Family history of throat cancer    Hashimoto's disease    Hypertension    Non-celiac gluten sensitivity    Past Surgical History:  Procedure Laterality Date   APPENDECTOMY  1979   CHOLECYSTECTOMY N/A 08/04/2022   Procedure: LAPAROSCOPIC CHOLECYSTECTOMY WITH ICG DYE;  Surgeon: Andria Meuse, MD;  Location: [facility] OR;  Service: General;  Laterality: N/A;   ERCP N/A 08/03/2022   Procedure: ENDOSCOPIC RETROGRADE CHOLANGIOPANCREATOGRAPHY (ERCP);  Surgeon: Iva Boop, MD;  Location: [facility] ENDOSCOPY;  Service: Gastroenterology;  Laterality: N/A;   LUMBAR DISC SURGERY  1974   MASTECTOMY W/ SENTINEL NODE BIOPSY Left 2019   RIGHT OOPHORECTOMY  1979   due to ovarian torsion   SCLEROTHERAPY  08/03/2022   Procedure: SCLEROTHERAPY;  Surgeon: Iva Boop, MD;  Location: [facility] ENDOSCOPY;  Service: Gastroenterology;;   Dennison Mascot  08/03/2022   Procedure: Dennison Mascot;  Surgeon: Iva Boop, MD;  Location: [facility] ENDOSCOPY;  Service: Gastroenterology;;   URETEROSCOPY Right 2017   Social History   Socioeconomic History   Marital status: Divorced    Spouse name: Not on file   Number of children: 2   Years of education: 12   Highest education level: High school graduate  Occupational History   Occupation: retired Software engineer for [employer]  Tobacco Use   Smoking status: Never   Smokeless tobacco: Never  Vaping Use   Vaping status: Never Used  Substance and Sexual Activity   Alcohol use: Not Currently   Drug use: Never   Sexual activity: Not Currently  Other Topics Concern   Not on file   Social History Narrative   Not on file   Social Drivers of Health   Financial Resource Strain: Low Risk  (12/21/2022)   Overall Financial Resource Strain (CARDIA)    Difficulty of Paying Living Expenses: Not very hard  Food Insecurity: No Food Insecurity (12/21/2022)   Hunger Vital Sign    Worried About Running Out of Food in the Last Year: Never true    Ran Out of Food in the Last Year: Never true  Transportation Needs: No Transportation Needs (12/21/2022)   PRAPARE - Administrator, Civil Service (Medical): No    Lack of Transportation (Non-Medical): No  Physical Activity: Insufficiently Active (12/21/2022)   Exercise Vital Sign    Days of Exercise per Week: 6 days    Minutes of Exercise per Session: 10 min  Stress: No Stress Concern Present (12/21/2022)   [employer] of Occupational Health - Occupational Stress Questionnaire    Feeling of Stress : Not at all  Social Connections: Moderately Isolated (12/21/2022)   Social Connection and Isolation Panel [NHANES]    Frequency of Communication with Friends and Family: More than three times a week    Frequency of Social Gatherings with Friends and Family: More than three times a week    Attends Religious Services: More than 4 times per year  Active Member of Clubs or Organizations: No    Attends Banker Meetings: Never    Marital Status: Divorced  Catering manager Violence: Not At Risk (12/21/2022)   Humiliation, Afraid, Rape, and Kick questionnaire    Fear of Current or Ex-Partner: No    Emotionally Abused: No    Physically Abused: No    Sexually Abused: No   Family History  Problem Relation Age of Onset   Lung cancer Mother 29       hx smoking   Lung cancer Father 64       hx smoking   Breast cancer Sister 45       in remission, now in mid 55s   Lung cancer Brother 71       smoker   Throat cancer Brother        alcohol   Colon cancer Neg Hx    Cervical cancer Neg Hx    Ovarian cancer Neg  Hx    Current Outpatient Medications:    anastrozole (ARIMIDEX) 1 MG tablet, TAKE 1 TABLET(1 MG) BY MOUTH DAILY, Disp: 90 tablet, Rfl: 3   BLACK COHOSH PO, Take by mouth. 2 Tsp daily, Disp: , Rfl:    Calcium-Magnesium (CAL-MAG PO), Take by mouth., Disp: , Rfl:    Cholecalciferol (VITAMIN D3) LIQD, Take 6 drops by mouth daily., Disp: , Rfl:    hydrochlorothiazide (HYDRODIURIL) 12.5 MG tablet, Take 1 tablet (12.5 mg total) by mouth daily., Disp: 90 tablet, Rfl: 3   OVER THE COUNTER MEDICATION, Take 4 Scoops by mouth daily. Pecta Sol-C (Mod. Citrus Pectin), Disp: , Rfl:    OVER THE COUNTER MEDICATION, Take 2 Scoops by mouth daily. Fermented Mushroom Blend, Disp: , Rfl:    OVER THE COUNTER MEDICATION, Take 6 capsules by mouth daily. Cruciferous Complete, Disp: , Rfl:    OVER THE COUNTER MEDICATION, Nitric Balance 4 tsp daily as needed, Disp: , Rfl:    potassium chloride SA (KLOR-CON M) 20 MEQ tablet, Take 1 tablet (20 mEq total) by mouth daily for 5 days., Disp: 10 tablet, Rfl: 0   UNABLE TO FIND, Take 10 drops by mouth daily. Med Name: Liquid Vitamin A, Disp: , Rfl:    UNABLE TO FIND, Med Name: Hydrex 2 vials daily, Disp: , Rfl:    UNABLE TO FIND, Med Name: Adrena Calm 1/4 pump daily, Disp: , Rfl:    UNABLE TO FIND, Med Name: BIOST skeletal and cellular Health 6 capsules daily, Disp: , Rfl:    UNABLE TO FIND, Med Name: A-C Carbamide 3 daily, Disp: , Rfl:    UNABLE TO FIND, Med Name: Derma Co 1 daily, Disp: , Rfl:    oxyCODONE (OXY IR/ROXICODONE) 5 MG immediate release tablet, Take 1 tablet (5 mg total) by mouth every 6 (six) hours as needed for moderate pain or severe pain. (Patient not taking: Reported on 07/04/2023), Disp: 15 tablet, Rfl: 0  Physical exam:  Vitals:   07/04/23 0917 07/04/23 0925  BP: (!) 161/74 (!) 149/73  Pulse: (!) 57   Resp: 18   Temp: (!) 96 F (35.6 C)   TempSrc: Tympanic   SpO2: 98%   Weight: 192 lb 8 oz (87.3 kg)    Physical Exam Vitals reviewed.   Constitutional:      General: She is not in acute distress.    Appearance: She is not ill-appearing.  Pulmonary:     Effort: Pulmonary effort is normal.  Chest:     Comments: Breast  exam: declined Abdominal:     General: There is no distension.  Skin:    General: Skin is warm.     Coloration: Skin is not pale.     Findings: No rash.  Neurological:     Mental Status: She is alert and oriented to person, place, and time.  Psychiatric:        Mood and Affect: Mood normal.        Behavior: Behavior normal.   Imaging:  09/08/2022- Mammo Screening Right [facility]) ASSESSMENT:  BI-RADS Category: 1-Mammo 50Yr : Negative.  Mammogram due in 1 year.  Recommendation Laterality: Right  Annual routine screening mammogram is recommended.   Assessment and plan- Patient is a 73 y.o. female with   History of stage Ib breast cancer of left breast-  pathological prognostic stage T2N2A grade 2 invasive mammary carcinoma with mixed ductal and lobular features ER and PR greater than 90% positive and HER-2/neu negative status post left mastectomy and axillary lymph node dissection. Declined adjuvant chemotherapy or radiation. Completed 5 years of adjuvant arimidex given ER/PR positivity. Clinically no evidence of recurrent disease. She continues mammograms of right breast with her pcp at [facility]. Last mammo was 09/08/22 reported as bi-rads category 1. Plan to repeat May 2025. We reviewed symptoms that would be concerning for recurrence and warrant sooner return to clinic. She continues annual surveillance.  She is also being followed by surgical oncology at [facility] and we could consider releasing her from our clinic if she prefers.  Osteopenia- at baseline. At risk for increased bone loss on AI therapy. Bone density reviewed and remains stable. Repeat 05/27/2024.  Cancer screenings & wellness- she will continue to see her pcp for other cancer screenings, management of her comorbidities, and wellness.   Disposition:  1  year- see me for breast cancer surveillance- [place]   Thank you for allowing me to participate in the care of this very pleasant patient.    Visit Diagnosis 1. Encounter for follow-up surveillance of breast cancer    Consuello Masse, DNP, AGNP-C, [facility] [phone] (clinic) 07/04/2023

## 2023-09-11 LAB — HM MAMMOGRAPHY

## 2023-09-12 ENCOUNTER — Encounter: Payer: Self-pay | Admitting: Dermatology

## 2023-09-12 ENCOUNTER — Ambulatory Visit: Payer: Medicare HMO | Admitting: Dermatology

## 2023-09-12 VITALS — BP 128/79 | HR 59

## 2023-09-12 DIAGNOSIS — C44319 Basal cell carcinoma of skin of other parts of face: Secondary | ICD-10-CM

## 2023-09-12 DIAGNOSIS — D492 Neoplasm of unspecified behavior of bone, soft tissue, and skin: Secondary | ICD-10-CM | POA: Diagnosis not present

## 2023-09-12 DIAGNOSIS — D2239 Melanocytic nevi of other parts of face: Secondary | ICD-10-CM | POA: Diagnosis not present

## 2023-09-12 DIAGNOSIS — D485 Neoplasm of uncertain behavior of skin: Secondary | ICD-10-CM

## 2023-09-12 DIAGNOSIS — L820 Inflamed seborrheic keratosis: Secondary | ICD-10-CM

## 2023-09-12 NOTE — Progress Notes (Unsigned)
 New Patient Visit   Subjective  Meredith Hamilton is a 73 y.o. female who presents for the following: Spot Check  Patient states she has spot located at the right Cheek that she would like to have examined. Patient reports the areas have been there for 9 months. She reports the areas are not bothersome. Patient reports she has had moderate sun exposure throughout her life. Currently she does not apply sunscreens. Patient reports she has not previously been treated for these areas. Patient denies Hx of bx. Patient denies family history of skin cancer(s).  The patient has spots, moles and lesions to be evaluated, some may be new or changing and the patient may have concern these could be cancer.   The following portions of the chart were reviewed this encounter and updated as appropriate: medications, allergies, medical history  Review of Systems:  No other skin or systemic complaints except as noted in HPI or Assessment and Plan.  Objective  Well appearing patient in no apparent distress; mood and affect are within normal limits.  A focused examination was performed of the following areas: Right Cheek  Relevant exam findings are noted in the Assessment and Plan.          R/O BCC Right Buccal Cheek 1 cm x 5mm Pink atrophic plaque Right Buccal Cheek   Assessment & Plan   1. Suspected Basal Cell Carcinoma - Assessment:  Pink lesion on the right cheek, measuring 1 cm x 5 mm, suspicious for basal cell carcinoma based on clinical appearance. Patient denies bleeding or pain associated with the lesion.   - Plan:    Perform shave removal of the suspicious lesion        Instruct patient to apply Aquaphor or Vaseline to the biopsy site daily    Contact patient with biopsy results within one week    If biopsy confirms basal cell carcinoma:     - Refer patient to Dr. Fain Home, Mohs surgeon, for excision    Educate patient on sun protection measures:     - Use of sunscreen, hats, and  umbrellas    Schedule full skin check at the end of summer or fall  2. Inflamed and Irritated Seborrheic Keratosis - Assessment: Inflamed waxy brown papule on right cheek that  - Plan:    Perform cryotherapy     Advise patient to apply Vaseline to the treated area daily   NEOPLASM OF UNCERTAIN BEHAVIOR OF SKIN Right Buccal Cheek Epidermal / dermal shaving  Lesion diameter (cm):  1 Informed consent: discussed and consent obtained   Timeout: patient name, date of birth, surgical site, and procedure verified   Procedure prep:  Patient was prepped and draped in usual sterile fashion Prep type:  Isopropyl alcohol Anesthesia: the lesion was anesthetized in a standard fashion   Anesthetic:  1% lidocaine  w/ epinephrine  1-100,000 buffered w/ 8.4% NaHCO3 Instrument used: flexible razor blade   Hemostasis achieved with: pressure, aluminum chloride and electrodesiccation   Outcome: patient tolerated procedure well   Post-procedure details: sterile dressing applied and wound care instructions given   Dressing type: bandage and petrolatum   Specimen 1 - Surgical pathology Differential Diagnosis: R/O Morpheaform basal cell carcinoma  Check Margins: No INFLAMED SEBORRHEIC KERATOSIS Right Buccal Cheek Destruction of lesion - Right Buccal Cheek Complexity: simple   Destruction method: cryotherapy   Informed consent: discussed and consent obtained   Timeout:  patient name, date of birth, surgical site, and procedure verified Lesion destroyed using liquid  nitrogen: Yes   Cryotherapy cycles:  1 Post-procedure details: wound care instructions given    No follow-ups on file.  I, Jetta Ager, am acting as Neurosurgeon for [employer], DO.  Documentation: I have reviewed the above documentation for accuracy and completeness, and I agree with the above.  Louana Roup, DO

## 2023-09-12 NOTE — Patient Instructions (Addendum)

## 2023-09-14 LAB — SURGICAL PATHOLOGY

## 2023-09-19 ENCOUNTER — Ambulatory Visit: Payer: Self-pay | Admitting: Dermatology

## 2023-09-19 NOTE — Progress Notes (Signed)
 Hi Shirron,  Please call patient and notify that results of biopsy were positive for a skin cancer that needs to be treated with Mohs Surgery.  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

## 2023-09-20 DIAGNOSIS — C4491 Basal cell carcinoma of skin, unspecified: Secondary | ICD-10-CM | POA: Insufficient documentation

## 2023-09-28 ENCOUNTER — Ambulatory Visit: Payer: Self-pay | Admitting: Family Medicine

## 2023-09-28 ENCOUNTER — Encounter: Payer: Self-pay | Admitting: Family Medicine

## 2023-09-28 VITALS — BP 137/63 | HR 50 | Ht 66.0 in | Wt 193.4 lb

## 2023-09-28 DIAGNOSIS — D519 Vitamin B12 deficiency anemia, unspecified: Secondary | ICD-10-CM

## 2023-09-28 DIAGNOSIS — E538 Deficiency of other specified B group vitamins: Secondary | ICD-10-CM

## 2023-09-28 DIAGNOSIS — E876 Hypokalemia: Secondary | ICD-10-CM

## 2023-09-28 DIAGNOSIS — R739 Hyperglycemia, unspecified: Secondary | ICD-10-CM

## 2023-09-28 DIAGNOSIS — E559 Vitamin D deficiency, unspecified: Secondary | ICD-10-CM | POA: Diagnosis not present

## 2023-09-28 DIAGNOSIS — I10 Essential (primary) hypertension: Secondary | ICD-10-CM | POA: Diagnosis not present

## 2023-09-28 MED ORDER — HYDROCHLOROTHIAZIDE 12.5 MG PO TABS: 12.5000 mg | ORAL_TABLET | Freq: Every day | ORAL | 3 refills | Status: AC

## 2023-09-28 NOTE — Assessment & Plan Note (Signed)
 Hypertension is well-controlled with current medication regimen. Blood pressure readings are satisfactory. - Continue hydrochlorothiazide  12.5 mg daily. - Send prescription refill for hydrochlorothiazide  to [facility] on [facility] for a one-year supply.

## 2023-09-28 NOTE — Progress Notes (Signed)
 Established patient visit   Patient: Meredith Hamilton   DOB: 10-01-1950   73 y.o. Female  MRN: [id number] Visit Date: 09/28/2023  Today's healthcare provider: Aden Agreste, MD   Chief Complaint  Patient presents with   Medical Management of Chronic Issues   Hypertension    Patient reports taking medication as prescribed and tolerating well with no s/e. No symptoms to report and she monitors at home.    Subjective    Hypertension   HPI     Hypertension    Additional comments: Patient reports taking medication as prescribed and tolerating well with no s/e. No symptoms to report and she monitors at home.       Last edited by Pasty Bongo, CMA on 09/28/2023 10:30 AM.       Discussed the use of AI scribe software for clinical note transcription with the patient, who gave verbal consent to proceed.  History of Present Illness   Meredith Hamilton is a 73 year old female who presents for a routine follow-up visit.  A dermatological lesion recently treated with cryotherapy is healing well. She has identified another suspicious lesion and is awaiting evaluation. She continues hydrochlorothiazide  12.5 mg daily for hypertension but requires a prescription refill. Post-cholecystectomy, she experiences occasional abdominal sounds, which are manageable. She is not using any pain medication currently.         Medications: Outpatient Medications Prior to Visit  Medication Sig   anastrozole  (ARIMIDEX ) 1 MG tablet TAKE 1 TABLET(1 MG) BY MOUTH DAILY   BLACK COHOSH PO Take by mouth. 2 Tsp daily   Calcium-Magnesium (CAL-MAG PO) Take by mouth.   Cholecalciferol (VITAMIN D3) LIQD Take 6 drops by mouth daily.   OVER THE COUNTER MEDICATION Take 4 Scoops by mouth daily. Pecta Sol-C (Mod. Citrus Pectin)   OVER THE COUNTER MEDICATION Take 2 Scoops by mouth daily. Fermented Mushroom Blend   OVER THE COUNTER MEDICATION Take 6 capsules by mouth daily. Cruciferous Complete   OVER THE COUNTER  MEDICATION Nitric Balance 4 tsp daily as needed   potassium chloride  SA (KLOR-CON  M) 20 MEQ tablet Take 1 tablet (20 mEq total) by mouth daily for 5 days.   UNABLE TO FIND Take 10 drops by mouth daily. Med Name: Liquid Vitamin A   UNABLE TO FIND Med Name: Hydrex 2 vials daily   UNABLE TO FIND Med Name: Adrena Calm 1/4 pump daily   UNABLE TO FIND Med Name: BIOST skeletal and cellular Health 6 capsules daily   UNABLE TO FIND Med Name: A-C Carbamide 3 daily   UNABLE TO FIND Med Name: Derma Co 1 daily   [DISCONTINUED] hydrochlorothiazide  (HYDRODIURIL ) 12.5 MG tablet Take 1 tablet (12.5 mg total) by mouth daily.   [DISCONTINUED] oxyCODONE  (OXY IR/ROXICODONE ) 5 MG immediate release tablet Take 1 tablet (5 mg total) by mouth every 6 (six) hours as needed for moderate pain or severe pain.   No facility-administered medications prior to visit.    Review of Systems     Objective    BP 137/63 Comment: home reading @ 8:30  Pulse (!) 50   Ht 5' 6 (1.676 m)   Wt 193 lb 6.4 oz (87.7 kg)   SpO2 99%   BMI 31.22 kg/m    Physical Exam Vitals reviewed.  Constitutional:      General: She is not in acute distress.    Appearance: Normal appearance. She is well-developed. She is not diaphoretic.  HENT:     Head:  Normocephalic and atraumatic.   Eyes:     General: No scleral icterus.    Conjunctiva/sclera: Conjunctivae normal.   Neck:     Thyroid : No thyromegaly.   Cardiovascular:     Rate and Rhythm: Normal rate and regular rhythm.     Heart sounds: Normal heart sounds. No murmur heard. Pulmonary:     Effort: Pulmonary effort is normal. No respiratory distress.     Breath sounds: Normal breath sounds. No wheezing, rhonchi or rales.   Musculoskeletal:     Cervical back: Neck supple.     Right lower leg: No edema.     Left lower leg: No edema.  Lymphadenopathy:     Cervical: No cervical adenopathy.   Skin:    General: Skin is warm and dry.     Findings: No rash.   Neurological:      Mental Status: She is alert and oriented to person, place, and time. Mental status is at baseline.   Psychiatric:        Mood and Affect: Mood normal.        Behavior: Behavior normal.      Results for orders placed or performed in visit on 09/28/23  HM MAMMOGRAPHY  Result Value Ref Range   HM Mammogram 0-4 Bi-Rad 0-4 Bi-Rad, Self Reported Normal    Assessment & Plan     Problem List Items Addressed This Visit       Cardiovascular and Mediastinum   Essential (primary) hypertension - Primary   Hypertension is well-controlled with current medication regimen. Blood pressure readings are satisfactory. - Continue hydrochlorothiazide  12.5 mg daily. - Send prescription refill for hydrochlorothiazide  to [facility] on [facility] for a one-year supply.      Relevant Medications   hydrochlorothiazide  (HYDRODIURIL ) 12.5 MG tablet   Other Relevant Orders   Comprehensive metabolic panel with GFR   Lipid panel     Other   Vitamin D  insufficiency   Relevant Orders   VITAMIN D  25 Hydroxy (Vit-D Deficiency, Fractures)   Vitamin B12 deficiency   Relevant Orders   B12   Hypokalemia   Relevant Orders   Comprehensive metabolic panel with GFR   Anemia   Relevant Orders   CBC w/Diff/Platelet   B12   Other Visit Diagnoses       Hyperglycemia       Relevant Orders   Hemoglobin A1c   Lipid panel           Post-cholecystectomy status Occasional gastrointestinal rumblings and sound effects, which are normal post-surgery.  General Health Maintenance Labs are due for comprehensive metabolic panel, lipid panel, blood sugar, vitamin D , and B12. - Perform comprehensive metabolic panel including kidney and liver function tests. - Check lipid panel, blood sugar, vitamin D , and B12 levels. - Schedule follow-up appointment for physical examination in approximately six months, potentially in January. - Ensure wellness visit with nurse is scheduled for September.        Return in  about 6 months (around 03/29/2024) for CPE.       Aden Agreste, MD  [facility] [phone] (phone) [phone] (fax)  [facility]

## 2023-09-29 ENCOUNTER — Ambulatory Visit: Payer: Self-pay | Admitting: Family Medicine

## 2023-09-29 DIAGNOSIS — R7989 Other specified abnormal findings of blood chemistry: Secondary | ICD-10-CM

## 2023-09-29 DIAGNOSIS — E876 Hypokalemia: Secondary | ICD-10-CM

## 2023-09-29 DIAGNOSIS — N289 Disorder of kidney and ureter, unspecified: Secondary | ICD-10-CM

## 2023-09-29 LAB — LIPID PANEL
Chol/HDL Ratio: 3 ratio (ref 0.0–4.4)
Cholesterol, Total: 155 mg/dL (ref 100–199)
HDL: 52 mg/dL (ref >39)
LDL Chol Calc (NIH): 87 mg/dL (ref 0–99)
Triglycerides: 84 mg/dL (ref 0–149)
VLDL Cholesterol Cal: 16 mg/dL (ref 5–40)

## 2023-09-29 LAB — CBC WITH DIFFERENTIAL/PLATELET
Basophils Absolute: 0 10*3/uL (ref 0.0–0.2)
Basos: 1 %
EOS (ABSOLUTE): 0.2 10*3/uL (ref 0.0–0.4)
Eos: 3 %
Hematocrit: 37.5 % (ref 34.0–46.6)
Hemoglobin: 12.6 g/dL (ref 11.1–15.9)
Immature Grans (Abs): 0 10*3/uL (ref 0.0–0.1)
Immature Granulocytes: 0 %
Lymphocytes Absolute: 2.8 10*3/uL (ref 0.7–3.1)
Lymphs: 45 %
MCH: 32.8 pg (ref 26.6–33.0)
MCHC: 33.6 g/dL (ref 31.5–35.7)
MCV: 98 fL — ABNORMAL HIGH (ref 79–97)
Monocytes Absolute: 0.5 10*3/uL (ref 0.1–0.9)
Monocytes: 7 %
Neutrophils Absolute: 2.7 10*3/uL (ref 1.4–7.0)
Neutrophils: 44 %
Platelets: 167 10*3/uL (ref 150–450)
RBC: 3.84 x10E6/uL (ref 3.77–5.28)
RDW: 13.4 % (ref 11.7–15.4)
WBC: 6.1 10*3/uL (ref 3.4–10.8)

## 2023-09-29 LAB — COMPREHENSIVE METABOLIC PANEL WITH GFR
ALT: 16 IU/L (ref 0–32)
AST: 27 IU/L (ref 0–40)
Albumin: 4.6 g/dL (ref 3.8–4.8)
Alkaline Phosphatase: 74 IU/L (ref 44–121)
BUN/Creatinine Ratio: 19 (ref 12–28)
BUN: 20 mg/dL (ref 8–27)
Bilirubin Total: 0.8 mg/dL (ref 0.0–1.2)
CO2: 19 mmol/L — ABNORMAL LOW (ref 20–29)
Calcium: 9.4 mg/dL (ref 8.7–10.3)
Chloride: 105 mmol/L (ref 96–106)
Creatinine, Ser: 1.04 mg/dL — ABNORMAL HIGH (ref 0.57–1.00)
Globulin, Total: 2.2 g/dL (ref 1.5–4.5)
Glucose: 95 mg/dL (ref 70–99)
Potassium: 3 mmol/L — ABNORMAL LOW (ref 3.5–5.2)
Sodium: 143 mmol/L (ref 134–144)
Total Protein: 6.8 g/dL (ref 6.0–8.5)
eGFR: 56 mL/min/{1.73_m2} — ABNORMAL LOW (ref >59)

## 2023-09-29 LAB — HEMOGLOBIN A1C
Est. average glucose Bld gHb Est-mCnc: 108 mg/dL
Hgb A1c MFr Bld: 5.4 % (ref 4.8–5.6)

## 2023-09-29 LAB — VITAMIN B12: Vitamin B-12: 192 pg/mL — ABNORMAL LOW (ref 232–1245)

## 2023-09-29 LAB — VITAMIN D 25 HYDROXY (VIT D DEFICIENCY, FRACTURES): Vit D, 25-Hydroxy: 30.8 ng/mL (ref 30.0–100.0)

## 2023-10-06 ENCOUNTER — Ambulatory Visit: Payer: Medicare HMO | Admitting: Family Medicine

## 2023-10-25 ENCOUNTER — Encounter: Payer: Self-pay | Admitting: Dermatology

## 2023-10-30 ENCOUNTER — Ambulatory Visit: Admitting: Dermatology

## 2023-10-30 ENCOUNTER — Encounter: Payer: Self-pay | Admitting: Dermatology

## 2023-10-30 VITALS — BP 134/66 | HR 60 | Temp 98.0°F

## 2023-10-30 DIAGNOSIS — L579 Skin changes due to chronic exposure to nonionizing radiation, unspecified: Secondary | ICD-10-CM | POA: Diagnosis not present

## 2023-10-30 DIAGNOSIS — L814 Other melanin hyperpigmentation: Secondary | ICD-10-CM

## 2023-10-30 DIAGNOSIS — C4491 Basal cell carcinoma of skin, unspecified: Secondary | ICD-10-CM

## 2023-10-30 DIAGNOSIS — C44319 Basal cell carcinoma of skin of other parts of face: Secondary | ICD-10-CM | POA: Diagnosis not present

## 2023-10-30 MED ORDER — OXYCODONE HCL 5 MG PO TABS: 5.0000 mg | ORAL_TABLET | Freq: Four times a day (QID) | ORAL | 0 refills | Status: AC | PRN

## 2023-10-30 NOTE — Progress Notes (Signed)
 Follow-Up Visit   Subjective  Meredith Hamilton is a 73 y.o. 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 Alm.  The following portions of the chart were reviewed this encounter and updated as appropriate: medications, allergies, medical history  Review of Systems:  No other skin or systemic complaints except as noted in HPI or Assessment and Plan.  Objective  Well appearing patient in no apparent distress; mood and affect are within normal limits.  A focused examination was performed of the following areas: Right buccal cheek Relevant physical exam findings are noted in the Assessment and Plan.   Right Buccal Cheek Healing biopsy site   Assessment & Plan   BASAL CELL CARCINOMA (BCC), UNSPECIFIED SITE Right Buccal Cheek Mohs surgery  Consent obtained: written  Anticoagulation: Was the anticoagulation regimen changed prior to Mohs? No    Anesthesia: Anesthesia method: local infiltration Local anesthetic: lidocaine  1% WITH epi  Procedure Details: Timeout: pre-procedure verification complete Procedure Prep: patient was prepped and draped in usual sterile fashion Prep type: chlorhexidine  Pre-Op diagnosis: basal cell carcinoma BCC subtype: infiltrative and superficial MohsAIQ Surgical site (if tumor spans multiple areas, please select predominant area): cheek (including jawline) Surgery side: right Surgical site (from skin exam): Right Buccal Cheek Pre-operative length (cm): 1.3 Pre-operative width (cm): 1 Indications for Mohs surgery: anatomic location where tissue conservation is critical  Micrographic Surgery Details: Post-operative length (cm): 1.6 Post-operative width (cm): 1.7 Number of Mohs stages: 1 Cumulative additional sections past 5 per stage: 0 Post surgery depth of defect: dermis  Skin repair Complexity:  Complex Final length (cm):  4.1 Informed  consent: discussed and consent obtained   Timeout: patient name, date of birth, surgical site, and procedure verified   Procedure prep:  Patient was prepped and draped in usual sterile fashion Prep type:  Chlorhexidine  Anesthesia: the lesion was anesthetized in a standard fashion   Anesthetic:  1% lidocaine  w/ epinephrine  1-100,000 buffered w/ 8.4% NaHCO3 Reason for type of repair: reduce tension to allow closure, preserve normal anatomy, preserve normal anatomical and functional relationships and allow side-to-side closure without requiring a flap or graft   Undermining: area extensively undermined   Subcutaneous layers (deep stitches):  Suture size:  5-0 Suture type: Monocryl (poliglecaprone 25)   Stitches:  Buried vertical mattress Fine/surface layer approximation (top stitches):  Suture size:  6-0 Suture type: fast-absorbing plain gut   Stitches: simple running   Hemostasis achieved with: suture, pressure and electrodesiccation Outcome: patient tolerated procedure well with no complications   Post-procedure details: sterile dressing applied and wound care instructions given   Dressing type: bandage and pressure dressing    Related Medications oxyCODONE  (OXY IR/ROXICODONE ) 5 MG immediate release tablet Take 1 tablet (5 mg total) by mouth every 6 (six) hours as needed for up to 8 doses.   Return in about 4 weeks (around 11/27/2023) for wound check.  LILLETTE Darice Smock, CMA, am acting as scribe for RUFUS CHRISTELLA HOLY, MD.    10/30/2023  HISTORY OF PRESENT ILLNESS  Meredith Hamilton is seen in consultation at the request of Dr. Alm for biopsy-proven Superficial Basal Cell Carcinoma of the right buccal cheek. They note that the area has been present for about 6 months increasing in size with time.  There is no history of previous treatment.  Reports no other new or changing lesions and has no other complaints today.  Medications and allergies: see patient  chart.  Review of systems: Reviewed 8  systems and notable for the above skin cancer.  All other systems reviewed are unremarkable/negative, unless noted in the HPI. Past medical history, surgical history, family history, social history were also reviewed and are noted in the chart/questionnaire.    PHYSICAL EXAMINATION  General: Well-appearing, in no acute distress, alert and oriented x 4. Vitals reviewed in chart (if available).   Skin: Exam reveals a 1.3 x 1.0 cm erythematous papule and biopsy scar on the right buccal cheek. There are rhytids, telangiectasias, and lentigines, consistent with photodamage.   Biopsy report(s) reviewed, confirming the diagnosis.   ASSESSMENT  1) Superficial Basal Cell Carcinoma with focal infiltration on the right cheek 2) photodamage 3) solar lentigines   PLAN   1. Due to location, size, histology, or recurrence and the likelihood of subclinical extension as well as the need to conserve normal surrounding tissue, the patient was deemed acceptable for Mohs micrographic surgery (MMS).  The nature and purpose of the procedure, associated benefits and risks including recurrence and scarring, possible complications such as pain, infection, and bleeding, and alternative methods of treatment if appropriate were discussed with the patient during consent. The lesion location was verified by the patient, by reviewing previous notes, pathology reports, and by photographs as well as angulation measurements if available.  Informed consent was reviewed and signed by the patient, and timeout was performed at 9:30 AM. See op note below.  2. For the photodamage and solar lentigines, sun protection discussed/information given on OTC sunscreens, and we recommend continued regular follow-up with primary dermatologist every 6 months or sooner for any growing, bleeding, or changing lesions. 3. Prognosis and future surveillance discussed. 4. Letter with treatment outcome sent to referring provider. 5. Pain  acetaminophen /ibuprofen/oxycodone  5 mg   MOHS MICROGRAPHIC SURGERY AND RECONSTRUCTION  Initial size:   1.3 x 1.0 cm Surgical defect/wound size: 1.6 x 1.7 cm Anesthesia:    0.33% lidocaine  with 1:200,000 epinephrine  EBL:    <5 mL Complications:  None Repair type:   Complex SQ suture:   5-0 Monocryl Cutaneous suture:  6-0 Plain gut Final size of the repair: 4.1 cm  Stages: 1  STAGE I: Anesthesia achieved with 0.5% lidocaine  with 1:200,000 epinephrine . ChloraPrep applied. 2 section(s) excised using Mohs technique (this includes total peripheral and deep tissue margin excision and evaluation with frozen sections, excised and interpreted by the same physician). The tumor was first debulked and then excised with an approx. 2mm margin.  Hemostasis was achieved with electrocautery as needed.  The specimen was then oriented, subdivided/relaxed, inked, and processed using Mohs technique.    Frozen section analysis revealed a clear deep and peripheral margin.   Reconstruction  The surgical wound was then cleaned, prepped, and re-anesthetized as above. Wound edges were undermined extensively along at least one entire edge and at a distance equal to or greater than the width of the defect (see wound defect size above) in order to achieve closure and decrease wound tension and anatomic distortion. Redundant tissue repair including standing cone removal was performed. Hemostasis was achieved with electrocautery. Subcutaneous and epidermal tissues were approximated with the above sutures. The surgical site was then lightly scrubbed with sterile, saline-soaked gauze. Steri-strips were applied, and the area was then bandaged using Vaseline ointment, non-adherent gauze, gauze pads, and tape to provide an adequate pressure dressing. The patient tolerated the procedure well, was given detailed written and verbal wound care instructions, and was discharged in good condition.  The patient will follow-up: 4  weeks.    Documentation: I have reviewed the above documentation for accuracy and completeness, and I agree with the above.  RUFUS CHRISTELLA HOLY, MD

## 2023-10-30 NOTE — Patient Instructions (Signed)

## 2023-11-02 ENCOUNTER — Encounter: Payer: Self-pay | Admitting: Dermatology

## 2023-11-29 ENCOUNTER — Encounter: Payer: Self-pay | Admitting: Dermatology

## 2023-11-29 ENCOUNTER — Ambulatory Visit (INDEPENDENT_AMBULATORY_CARE_PROVIDER_SITE_OTHER): Admitting: Dermatology

## 2023-11-29 DIAGNOSIS — Z85828 Personal history of other malignant neoplasm of skin: Secondary | ICD-10-CM | POA: Diagnosis not present

## 2023-11-29 DIAGNOSIS — S01401A Unspecified open wound of right cheek and temporomandibular area, initial encounter: Secondary | ICD-10-CM

## 2023-11-29 DIAGNOSIS — C4491 Basal cell carcinoma of skin, unspecified: Secondary | ICD-10-CM

## 2023-11-29 DIAGNOSIS — L905 Scar conditions and fibrosis of skin: Secondary | ICD-10-CM

## 2023-11-29 NOTE — Progress Notes (Signed)
   Follow Up Visit   Subjective  Meredith Hamilton is a 73 y.o. female who presents for the following: follow up from Mohs surgery   The patient presents for follow up from Mohs surgery for a BCC on the right buccal cheek, treated on 10/30/23, repaired with linear closure. The patient has been bandaging the wound as directed. The endorse the following concerns: none  The following portions of the chart were reviewed this encounter and updated as appropriate: medications, allergies, medical history  Review of Systems:  No other skin or systemic complaints except as noted in HPI or Assessment and Plan.  Objective  Well appearing patient in no apparent distress; mood and affect are within normal limits.  A focal examination was performed including scalp, head, face. All findings within normal limits unless otherwise noted below.  Healing wound with mild erythema  Relevant physical exam findings are noted in the Assessment and Plan.    Assessment & Plan    Healing Wound s/p Mohs for [facility] on the right buccal cheek, treated on 10/30/23, repaired with linear closure - Reassured that wound is healing well - No evidence of infection - No swelling, induration, purulence, dehiscence, or tenderness out of proportion to the clinical exam, see photo above - Discussed that scars take up to 12 months to mature from the date of surgery - Recommend SPF 30+ to scar daily to prevent purple color from UV exposure during scar maturation process - Discussed that erythema and raised appearance of scar will fade over the next 4-6 months - OK to start scar massage at 4-6 weeks post-op - Can consider silicone based products for scar healing starting at 6 weeks post-op  HISTORY OF BASAL CELL CARCINOMA OF THE SKIN - No evidence of recurrence today - Recommend regular full body skin exams - Recommend daily broad spectrum sunscreen SPF 30+ to sun-exposed areas, reapply every 2 hours as needed.  - Call if any new or  changing lesions are noted between office visits  Return if symptoms worsen or fail to improve, for wound check.  I, Meredith Hamilton, CMA, am acting as scribe for Meredith CHRISTELLA HOLY, MD.   Documentation: I have reviewed the above documentation for accuracy and completeness, and I agree with the above.  Meredith CHRISTELLA HOLY, MD

## 2023-11-29 NOTE — Patient Instructions (Addendum)

## 2023-12-02 ENCOUNTER — Other Ambulatory Visit: Payer: Self-pay | Admitting: Oncology

## 2023-12-26 ENCOUNTER — Ambulatory Visit: Payer: Medicare HMO

## 2023-12-26 VITALS — Ht 66.0 in | Wt 193.0 lb

## 2023-12-26 DIAGNOSIS — Z0000 Encounter for general adult medical examination without abnormal findings: Secondary | ICD-10-CM

## 2023-12-26 NOTE — Progress Notes (Signed)
 Subjective:   Meredith Hamilton is a 74 y.o. who presents for a Medicare Wellness preventive visit.  As a reminder, Annual Wellness Visits don't include a physical exam, and some assessments may be limited, especially if this visit is performed virtually. We may recommend an in-person follow-up visit with your provider if needed.  Visit Complete: Virtual I connected with  Meredith Hamilton on 12/26/23 by a audio enabled telemedicine application and verified that I am speaking with the correct person using two identifiers.  Patient Location: Home  Provider Location: Office/Clinic  I discussed the limitations of evaluation and management by telemedicine. The patient expressed understanding and agreed to proceed.  Vital Signs: Because this visit was a virtual/telehealth visit, some criteria may be missing or patient reported. Any vitals not documented were not able to be obtained and vitals that have been documented are patient reported.  VideoDeclined- This patient declined Librarian, academic. Therefore the visit was completed with audio only.  Persons Participating in Visit: Patient.  AWV Questionnaire: No: Patient Medicare AWV questionnaire was not completed prior to this visit.  Cardiac Risk Factors include: advanced age (>42men, >49 women);hypertension;obesity (BMI >30kg/m2)     Objective:    Today's Vitals   12/26/23 0850  Weight: 193 lb (87.5 kg)  Height: 5' 6 (1.676 m)   Body mass index is 31.15 kg/m.     12/26/2023    8:56 AM 12/21/2022    8:54 AM 08/03/2022   12:10 PM 08/02/2022    6:57 AM 07/05/2022    9:51 AM 01/04/2022    9:17 AM 07/06/2021   10:14 AM  Advanced Directives  Does Patient Have a Medical Advance Directive? No No No No No No No  Would patient like information on creating a medical advance directive? No - Patient declined   No - Patient declined  No - Patient declined     Current Medications (verified) Outpatient Encounter Medications  as of 12/26/2023  Medication Sig   anastrozole  (ARIMIDEX ) 1 MG tablet TAKE 1 TABLET(1 MG) BY MOUTH DAILY   BLACK COHOSH PO Take by mouth. 2 Tsp daily   Calcium-Magnesium (CAL-MAG PO) Take by mouth.   Cholecalciferol (VITAMIN D3) LIQD Take 6 drops by mouth daily.   hydrochlorothiazide  (HYDRODIURIL ) 12.5 MG tablet Take 1 tablet (12.5 mg total) by mouth daily.   OVER THE COUNTER MEDICATION Take 4 Scoops by mouth daily. Pecta Sol-C (Mod. Citrus Pectin)   OVER THE COUNTER MEDICATION Take 2 Scoops by mouth daily. Fermented Mushroom Blend   OVER THE COUNTER MEDICATION Take 6 capsules by mouth daily. Cruciferous Complete   OVER THE COUNTER MEDICATION Nitric Balance 4 tsp daily as needed   oxyCODONE  (OXY IR/ROXICODONE ) 5 MG immediate release tablet Take 1 tablet (5 mg total) by mouth every 6 (six) hours as needed for up to 8 doses.   potassium chloride  SA (KLOR-CON  M) 20 MEQ tablet Take 1 tablet (20 mEq total) by mouth daily for 5 days.   UNABLE TO FIND Take 10 drops by mouth daily. Med Name: Liquid Vitamin A   UNABLE TO FIND Med Name: Hydrex 2 vials daily   UNABLE TO FIND Med Name: Adrena Calm 1/4 pump daily   UNABLE TO FIND Med Name: BIOST skeletal and cellular Health 6 capsules daily   UNABLE TO FIND Med Name: A-C Carbamide 3 daily   UNABLE TO FIND Med Name: Derma Co 1 daily   No facility-administered encounter medications on file as of 12/26/2023.  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   R buccal cheek - MOHS needed  surgery7/21   Past Surgical History:  Procedure Laterality Date   APPENDECTOMY  1979   CHOLECYSTECTOMY N/A 08/04/2022   Procedure: LAPAROSCOPIC  CHOLECYSTECTOMY WITH ICG DYE;  Surgeon: Teresa Lonni HERO, MD;  Location: [facility] OR;  Service: General;  Laterality: N/A;   ERCP N/A 08/03/2022   Procedure: ENDOSCOPIC RETROGRADE CHOLANGIOPANCREATOGRAPHY (ERCP);  Surgeon: Avram Lupita BRAVO, MD;  Location: [facility] ENDOSCOPY;  Service: Gastroenterology;  Laterality: N/A;   LUMBAR DISC SURGERY  1974   MASTECTOMY W/ SENTINEL NODE BIOPSY Left 2019   RIGHT OOPHORECTOMY  1979   due to ovarian torsion   SCLEROTHERAPY  08/03/2022   Procedure: SCLEROTHERAPY;  Surgeon: Avram Lupita BRAVO, MD;  Location: [facility] ENDOSCOPY;  Service: Gastroenterology;;   ANNETT  08/03/2022   Procedure: ANNETT;  Surgeon: Avram Lupita BRAVO, MD;  Location: [facility] ENDOSCOPY;  Service: Gastroenterology;;   URETEROSCOPY Right 2017   Family History  Problem Relation Age of Onset   Lung cancer Mother 59       hx smoking   Lung cancer Father 18       hx smoking   Breast cancer Sister 71       in remission, now in mid 26s   Lung cancer Brother 62       smoker   Throat cancer Brother        alcohol   Colon cancer Neg Hx    Cervical cancer Neg Hx    Ovarian cancer Neg Hx    Social History   Socioeconomic History   Marital status: Divorced    Spouse name: Not on file   Number of children: 2   Years of education: 12   Highest education level: High school graduate  Occupational History   Occupation: retired Software engineer for [employer]  Tobacco Use   Smoking status: Never   Smokeless tobacco: Never  Vaping Use   Vaping status: Never Used  Substance and Sexual Activity   Alcohol use: Not Currently   Drug use: Never   Sexual activity: Not Currently  Other Topics Concern   Not on file  Social History Narrative   Not on file   Social Drivers of Health   Financial Resource Strain: Low Risk  (12/26/2023)   Overall Financial Resource Strain (CARDIA)    Difficulty of Paying Living Expenses: Not hard at all  Food Insecurity: No Food Insecurity (12/26/2023)   Hunger  Vital Sign    Worried About Running Out of Food in the Last Year: Never true    Ran Out of Food in the Last Year: Never true  Transportation Needs: No Transportation Needs (12/26/2023)   PRAPARE - Administrator, Civil Service (Medical): No    Lack of Transportation (Non-Medical): No  Physical Activity: Insufficiently Active (12/26/2023)   Exercise Vital Sign    Days of Exercise per Week: 3 days    Minutes of Exercise per Session: 20 min  Stress: No Stress Concern Present (12/26/2023)   [employer] of Occupational Health - Occupational Stress Questionnaire  Feeling of Stress: Not at all  Social Connections: Moderately Isolated (12/26/2023)   Social Connection and Isolation Panel    Frequency of Communication with Friends and Family: More than three times a week    Frequency of Social Gatherings with Friends and Family: More than three times a week    Attends Religious Services: More than 4 times per year    Active Member of [employer] or Organizations: No    Attends Engineer, structural: Never    Marital Status: Divorced    Tobacco Counseling Counseling given: Not Answered    Clinical Intake:  Pre-visit preparation completed: Yes  Pain : No/denies pain     BMI - recorded: 31.15 Nutritional Status: BMI > 30  Obese Nutritional Risks: None Diabetes: No  Lab Results  Component Value Date   HGBA1C 5.4 09/28/2023   HGBA1C 5.5 11/11/2022   HGBA1C 5.4 10/07/2021     How often do you need to have someone help you when you read instructions, pamphlets, or other written materials from your doctor or pharmacy?: 1 - Never  Interpreter Needed?: No  Information entered by :: Ellouise Haws, LPN   Activities of Daily Living     12/26/2023    8:52 AM  In your present state of health, do you have any difficulty performing the following activities:  Hearing? 0  Vision? 0  Difficulty concentrating or making decisions? 0  Walking or climbing stairs? 0   Dressing or bathing? 0  Doing errands, shopping? 0  Preparing Food and eating ? N  Using the Toilet? N  In the past six months, have you accidently leaked urine? N  Do you have problems with loss of bowel control? N  Managing your Medications? N  Managing your Finances? N  Housekeeping or managing your Housekeeping? N    Patient Care Team: Myrla Jon HERO, MD as PCP - General (Family Medicine) Arvell Marsh, DC (Chiropractic Medicine) Linnie Pierce DASEN, NP (Nurse Practitioner) Pollak, Adriana M, PA-C as Physician Assistant (Physician Assistant) Melanee Annah BROCKS, MD as Consulting Physician (Oncology) Pa, [facility] (Optometry)  I have updated your Care Teams any recent Medical Services you may have received from other providers in the past year.     Assessment:   This is a routine wellness examination for Meredith Hamilton.  Hearing/Vision screen Hearing Screening - Comments:: Pt denies any hearing issues  Vision Screening - Comments:: Pt will follow up with new provider    Goals Addressed             This Visit's Progress    Patient Stated       Maintain health and activity        Depression Screen     12/26/2023    8:54 AM 09/28/2023   10:32 AM 04/10/2023    9:22 AM 12/21/2022    8:51 AM 11/11/2022   10:48 AM 08/09/2022    2:36 PM 05/10/2022    9:53 AM  PHQ 2/9 Scores  PHQ - 2 Score 0 0 0 0 0 0 0  PHQ- 9 Score  1   0 4 0    Fall Risk     12/26/2023    8:56 AM 09/28/2023   10:33 AM 04/10/2023    9:22 AM 12/21/2022    8:47 AM 11/11/2022   10:48 AM  Fall Risk   Falls in the past year? 0 0 0 0 0  Number falls in past yr: 0 0 0 0 0  Injury with Fall? 0 0 0 0 0  Risk for fall due to : No Fall Risks No Fall Risks No Fall Risks No Fall Risks   Follow up Falls prevention discussed Falls evaluation completed Falls evaluation completed Falls prevention discussed;Education provided     MEDICARE RISK AT HOME:  Medicare Risk at Home Any stairs in or around the home?:  Yes If so, are there any without handrails?: No Home free of loose throw rugs in walkways, pet beds, electrical cords, etc?: Yes Adequate lighting in your home to reduce risk of falls?: Yes Life alert?: No Use of a cane, walker or w/c?: No Grab bars in the bathroom?: Yes Shower chair or bench in shower?: No Elevated toilet seat or a handicapped toilet?: No  TIMED UP AND GO:  Was the test performed?  No  Cognitive Function: 6CIT completed        12/26/2023    8:57 AM 12/21/2022    8:55 AM 02/27/2020    9:16 AM 02/18/2019    9:38 AM 01/25/2018   10:11 AM  6CIT Screen  What Year? 0 points 0 points 0 points 0 points 0 points  What month? 0 points 0 points 0 points 0 points 0 points  What time? 0 points 0 points 0 points 0 points 0 points  Count back from 20 0 points 0 points 0 points 0 points 0 points  Months in reverse 0 points 0 points 0 points 0 points 0 points  Repeat phrase 0 points 0 points 0 points 0 points 0 points  Total Score 0 points 0 points 0 points 0 points 0 points    Immunizations  There is no immunization history on file for this patient.  Screening Tests Health Maintenance  Topic Date Due   COVID-19 Vaccine (1) Never done   DTaP/Tdap/Td (1 - Tdap) Never done   Zoster Vaccines- Shingrix (1 of 2) Never done   Colonoscopy  Never done   Pneumococcal Vaccine: 50+ Years (1 of 1 - PCV) Never done   Influenza Vaccine  Never done   DEXA SCAN  05/26/2024   Mammogram  09/10/2024   Medicare Annual Wellness (AWV)  12/25/2024   Hepatitis C Screening  Completed   HPV VACCINES  Aged Out   Meningococcal B Vaccine  Aged Out    Health Maintenance Items Addressed: See Nurse Notes at the end of this note  Additional Screening:  Vision Screening: Recommended annual ophthalmology exams for early detection of glaucoma and other disorders of the eye. Is the patient up to date with their annual eye exam?  No  Who is the provider or what is the name of the office in  which the patient attends annual eye exams? Pt will follow up with new provider   Dental Screening: Recommended annual dental exams for proper oral hygiene  Community Resource Referral / Chronic Care Management: CRR required this visit?  No   CCM required this visit?  No   Plan:    I have personally reviewed and noted the following in the patient's chart:   Medical and social history Use of alcohol, tobacco or illicit drugs  Current medications and supplements including opioid prescriptions. Patient is currently taking opioid prescriptions. Information provided to patient regarding non-opioid alternatives. Patient advised to discuss non-opioid treatment plan with their provider. Functional ability and status Nutritional status Physical activity Advanced directives List of other physicians Hospitalizations, surgeries, and ER visits in previous 12 months Vitals Screenings to include cognitive,  depression, and falls Referrals and appointments  In addition, I have reviewed and discussed with patient certain preventive protocols, quality metrics, and best practice recommendations. A written personalized care plan for preventive services as well as general preventive health recommendations were provided to patient.   Ellouise VEAR Haws, LPN   [id number]   After Visit Summary: (MyChart) Due to this being a telephonic visit, the after visit summary with patients personalized plan was offered to patient via MyChart   Notes: Nothing significant to report at this time.

## 2023-12-26 NOTE — Patient Instructions (Addendum)
 Meredith Hamilton,  Thank you for taking the time for your Medicare Wellness Visit. I appreciate your continued commitment to your health goals. Please review the care plan we discussed, and feel free to reach out if I can assist you further.  Medicare recommends these wellness visits once per year to help you and your care team stay ahead of potential health issues. These visits are designed to focus on prevention, allowing your provider to concentrate on managing your acute and chronic conditions during your regular appointments.  Please note that Annual Wellness Visits do not include a physical exam. Some assessments may be limited, especially if the visit was conducted virtually. If needed, we may recommend a separate in-person follow-up with your provider.  Ongoing Care Seeing your primary care provider every 3 to 6 months helps us  monitor your health and provide consistent, personalized care.   Referrals If a referral was made during today's visit and you haven't received any updates within two weeks, please contact the referred provider directly to check on the status.  Recommended Screenings:  Health Maintenance  Topic Date Due   COVID-19 Vaccine (1) Never done   DTaP/Tdap/Td vaccine (1 - Tdap) Never done   Zoster (Shingles) Vaccine (1 of 2) Never done   Colon Cancer Screening  Never done   Pneumococcal Vaccine for age over 3 (1 of 1 - PCV) Never done   Flu Shot  Never done   DEXA scan (bone density measurement)  05/26/2024   Breast Cancer Screening  09/10/2024   Medicare Annual Wellness Visit  12/25/2024   Hepatitis C Screening  Completed   HPV Vaccine  Aged Out   Meningitis B Vaccine  Aged Out       12/26/2023    8:56 AM  Advanced Directives  Does Patient Have a Medical Advance Directive? No  Would patient like information on creating a medical advance directive? No - Patient declined   Advance Care Planning is important because it: Ensures you receive medical care that  aligns with your values, goals, and preferences. Provides guidance to your family and loved ones, reducing the emotional burden of decision-making during critical moments.  Vision: Annual vision screenings are recommended for early detection of glaucoma, cataracts, and diabetic retinopathy. These exams can also reveal signs of chronic conditions such as diabetes and high blood pressure.  Dental: Annual dental screenings help detect early signs of oral cancer, gum disease, and other conditions linked to overall health, including heart disease and diabetes.  Please see the attached documents for additional preventive care recommendations.  Vaccinations: declines all Influenza vaccine: recommend every Fall Pneumococcal vaccine: recommend once per lifetime Prevnar-20 Tdap vaccine: recommend every 10 years Shingles vaccine: recommend Shingrix which is 2 doses 2-6 months apart and over 90% effective     Covid-19: recommend 2 doses one month apart with a booster 6 months later

## 2024-01-16 ENCOUNTER — Ambulatory Visit: Admitting: Dermatology

## 2024-04-16 ENCOUNTER — Ambulatory Visit: Admitting: Dermatology

## 2024-04-16 ENCOUNTER — Encounter: Admitting: Family Medicine

## 2024-07-02 ENCOUNTER — Ambulatory Visit: Admitting: Nurse Practitioner

## 2024-07-02 ENCOUNTER — Inpatient Hospital Stay: Admitting: Oncology
